# Patient Record
Sex: Female | Born: 1950
Health system: Southern US, Community
[De-identification: ages and names within clinical notes are randomized; demographics above are authoritative.]

## PROBLEM LIST (undated history)

## (undated) DIAGNOSIS — F32A Depression, unspecified: Secondary | ICD-10-CM

## (undated) DIAGNOSIS — F1020 Alcohol dependence, uncomplicated: Secondary | ICD-10-CM

## (undated) DIAGNOSIS — E785 Hyperlipidemia, unspecified: Secondary | ICD-10-CM

## (undated) DIAGNOSIS — F329 Major depressive disorder, single episode, unspecified: Secondary | ICD-10-CM

## (undated) HISTORY — PX: NOSE SURGERY: SHX723

---

## 2001-09-20 ENCOUNTER — Emergency Department (HOSPITAL_COMMUNITY): Admission: EM | Admit: 2001-09-20 | Discharge: 2001-09-20 | Payer: Self-pay | Admitting: Emergency Medicine

## 2001-09-23 ENCOUNTER — Other Ambulatory Visit (HOSPITAL_COMMUNITY): Admission: RE | Admit: 2001-09-23 | Discharge: 2001-09-27 | Payer: Self-pay | Admitting: Psychiatry

## 2009-07-14 ENCOUNTER — Emergency Department (HOSPITAL_COMMUNITY): Admission: EM | Admit: 2009-07-14 | Discharge: 2009-07-15 | Payer: Self-pay | Admitting: Emergency Medicine

## 2009-07-15 ENCOUNTER — Inpatient Hospital Stay (HOSPITAL_COMMUNITY): Admission: AD | Admit: 2009-07-15 | Discharge: 2009-07-18 | Payer: Self-pay | Admitting: Psychiatry

## 2009-07-15 ENCOUNTER — Ambulatory Visit: Payer: Self-pay | Admitting: Psychiatry

## 2009-09-06 ENCOUNTER — Emergency Department (HOSPITAL_COMMUNITY): Admission: EM | Admit: 2009-09-06 | Discharge: 2009-09-06 | Payer: Self-pay | Admitting: Emergency Medicine

## 2010-04-30 LAB — BASIC METABOLIC PANEL
Calcium: 8.8 mg/dL (ref 8.4–10.5)
Chloride: 106 mEq/L (ref 96–112)
Creatinine, Ser: 0.75 mg/dL (ref 0.4–1.2)
GFR calc Af Amer: >60 mL/min (ref >60)
GFR calc non Af Amer: >60 mL/min (ref >60)
Glucose, Bld: 95 mg/dL (ref 70–99)
Potassium: 4.1 mEq/L (ref 3.5–5.1)
Sodium: 140 mEq/L (ref 135–145)

## 2010-04-30 LAB — DIFFERENTIAL
Basophils Absolute: 0.1 10*3/uL (ref 0.0–0.1)
Eosinophils Absolute: 0 10*3/uL (ref 0.0–0.7)
Eosinophils Relative: 1 % (ref 0–5)
Lymphocytes Relative: 33 % (ref 12–46)
Lymphs Abs: 2.4 10*3/uL (ref 0.7–4.0)
Monocytes Relative: 5 % (ref 3–12)

## 2010-04-30 LAB — GLUCOSE, CAPILLARY: Glucose-Capillary: 91 mg/dL (ref 70–99)

## 2010-04-30 LAB — ETHANOL: Alcohol, Ethyl (B): 194 mg/dL — ABNORMAL HIGH (ref 0–10)

## 2010-04-30 LAB — CBC
HCT: 41.2 % (ref 36.0–46.0)
MCV: 94.3 fL (ref 78.0–100.0)
Platelets: 308 10*3/uL (ref 150–400)
RBC: 4.37 MIL/uL (ref 3.87–5.11)
RDW: 14.4 % (ref 11.5–15.5)
WBC: 7.3 10*3/uL (ref 4.0–10.5)

## 2010-04-30 LAB — URINALYSIS, ROUTINE W REFLEX MICROSCOPIC
Glucose, UA: NEGATIVE mg/dL
Hgb urine dipstick: NEGATIVE
Ketones, ur: NEGATIVE mg/dL
pH: 5 (ref 5.0–8.0)

## 2010-04-30 LAB — POCT CARDIAC MARKERS
CKMB, poc: <1 ng/mL — ABNORMAL LOW (ref 1.0–8.0)
Troponin i, poc: <0.05 ng/mL (ref 0.00–0.09)

## 2010-04-30 LAB — CK TOTAL AND CKMB (NOT AT ARMC): CK, MB: 0.9 ng/mL (ref 0.3–4.0)

## 2010-04-30 LAB — MAGNESIUM: Magnesium: 2.1 mg/dL (ref 1.5–2.5)

## 2010-05-02 LAB — DIFFERENTIAL
Basophils Absolute: 0 10*3/uL (ref 0.0–0.1)
Basophils Relative: 1 % (ref 0–1)
Eosinophils Absolute: 0 10*3/uL (ref 0.0–0.7)
Eosinophils Relative: 1 % (ref 0–5)
Lymphocytes Relative: 21 % (ref 12–46)
Lymphs Abs: 1.1 10*3/uL (ref 0.7–4.0)
Neutro Abs: 3.8 10*3/uL (ref 1.7–7.7)

## 2010-05-02 LAB — HEMOGLOBIN A1C
Hgb A1c MFr Bld: 5.5 % (ref <5.7)
Mean Plasma Glucose: 111 mg/dL (ref <117)

## 2010-05-02 LAB — COMPREHENSIVE METABOLIC PANEL
BUN: 6 mg/dL (ref 6–23)
GFR calc non Af Amer: >60 mL/min (ref >60)
Glucose, Bld: 171 mg/dL — ABNORMAL HIGH (ref 70–99)
Potassium: 3.4 mEq/L — ABNORMAL LOW (ref 3.5–5.1)
Sodium: 138 mEq/L (ref 135–145)

## 2010-05-02 LAB — CBC
HCT: 41.6 % (ref 36.0–46.0)
Hemoglobin: 14.2 g/dL (ref 12.0–15.0)
Platelets: 199 10*3/uL (ref 150–400)
RDW: 16.1 % — ABNORMAL HIGH (ref 11.5–15.5)
WBC: 5.3 10*3/uL (ref 4.0–10.5)

## 2010-05-02 LAB — GLUCOSE, RANDOM: Glucose, Bld: 93 mg/dL (ref 70–99)

## 2010-05-02 LAB — RAPID URINE DRUG SCREEN, HOSP PERFORMED
Amphetamines: NOT DETECTED
Benzodiazepines: NOT DETECTED
Opiates: NOT DETECTED
Tetrahydrocannabinol: NOT DETECTED

## 2010-05-02 LAB — ETHANOL: Alcohol, Ethyl (B): 46 mg/dL — ABNORMAL HIGH (ref 0–10)

## 2010-12-23 IMAGING — CT CT HEAD W/O CM
1 series · 16 of 30 positions shown, 20 images · non-contrast
Comparison: None.

CLINICAL DATA: Altered mental status

CT HEAD WITHOUT CONTRAST
TECHNIQUE: Contiguous axial images were obtained from the base of
the skull through the vertex without contrast.

[Series 2: head_seq 4.5 h37s st · axial · 0.43mm/px · z∈[-179,-53]mm · 16 of 32 slices shown, 20 images]
[im 2/32  brain]
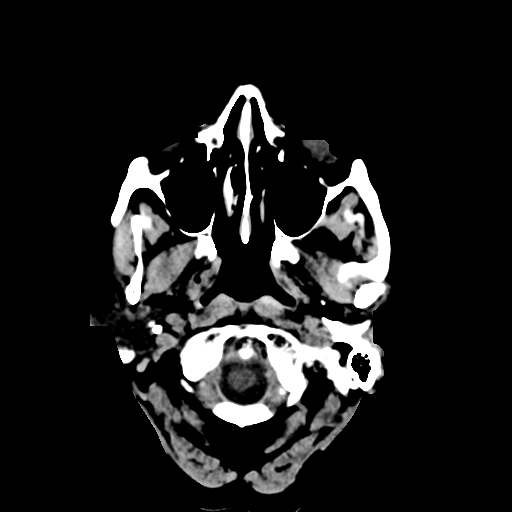
[im 2/32  bone]
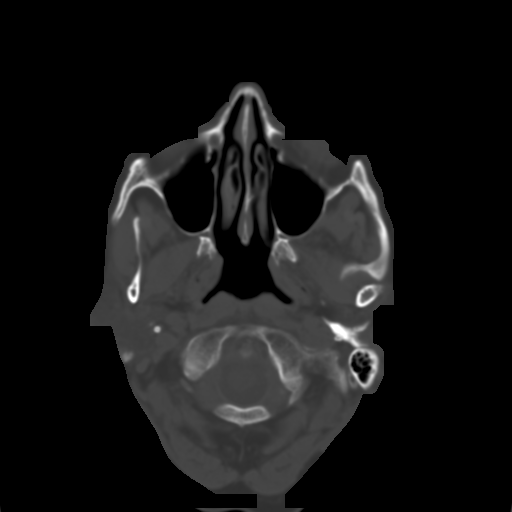
[im 4/32  brain]
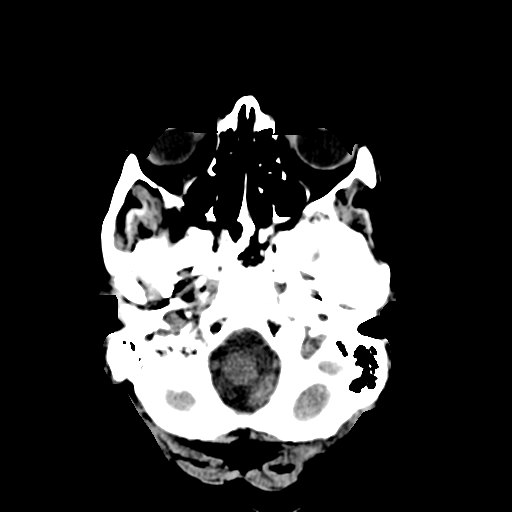
[im 6/32  brain]
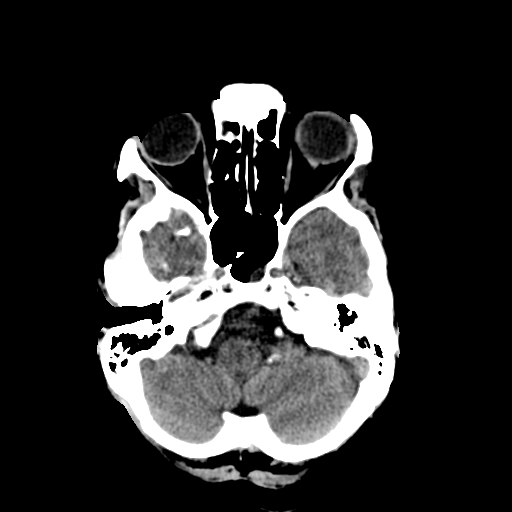
[im 8/32  brain]
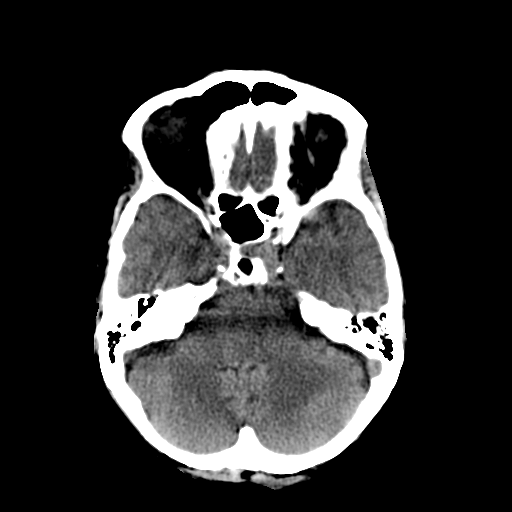
[im 9/32  brain]
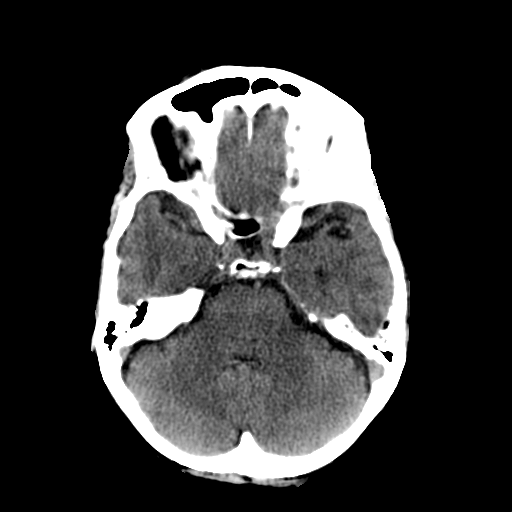
[im 9/32  bone]
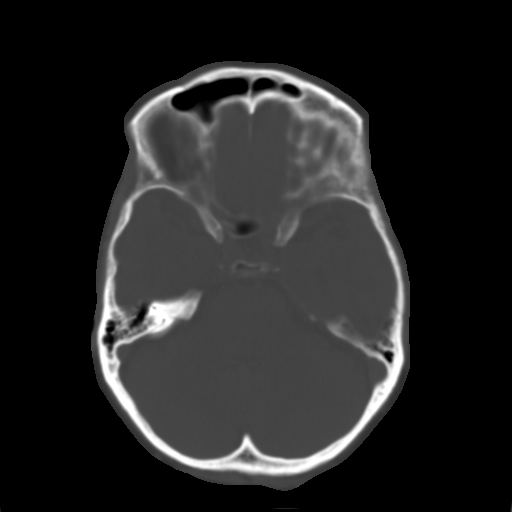
[im 11/32  brain]
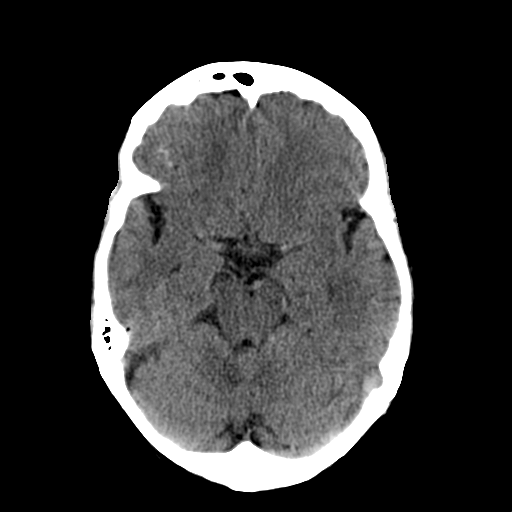
[im 13/32  brain]
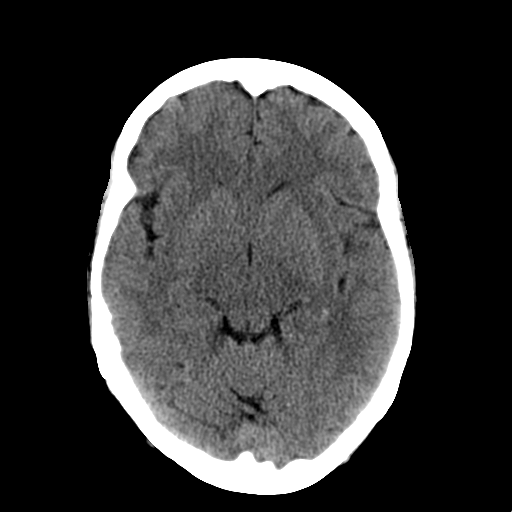
[im 15/32  brain]
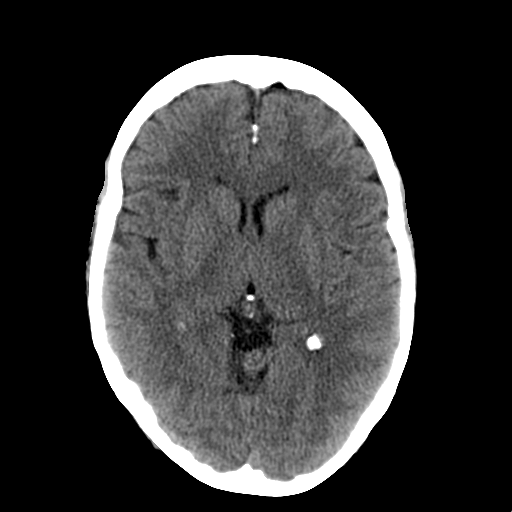
[im 17/32  brain]
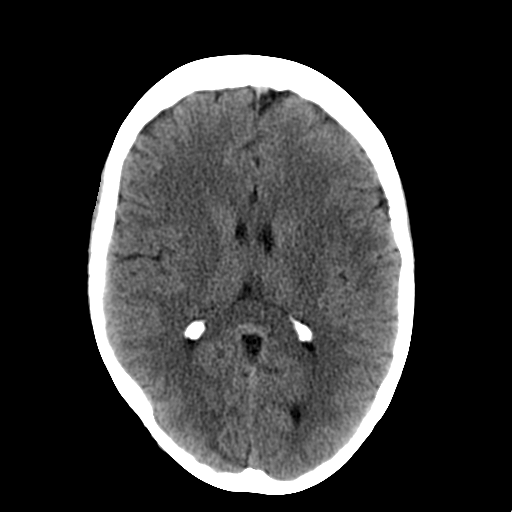
[im 17/32  bone]
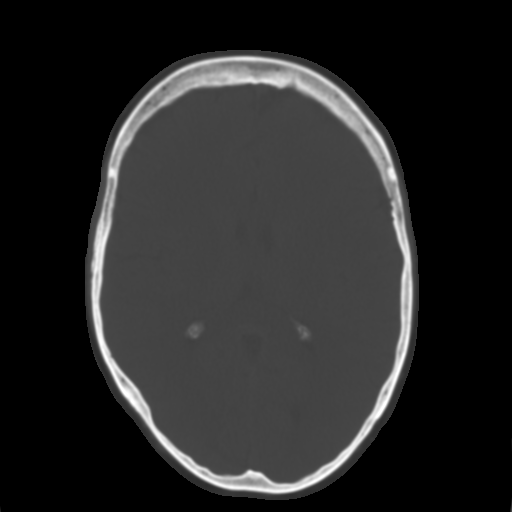
[im 19/32  brain]
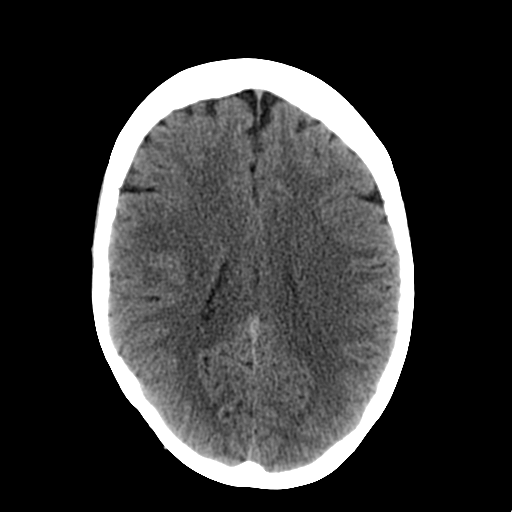
[im 21/32  brain]
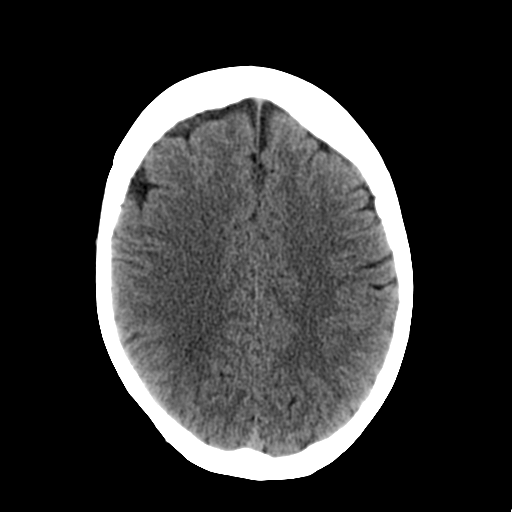
[im 23/32  brain]
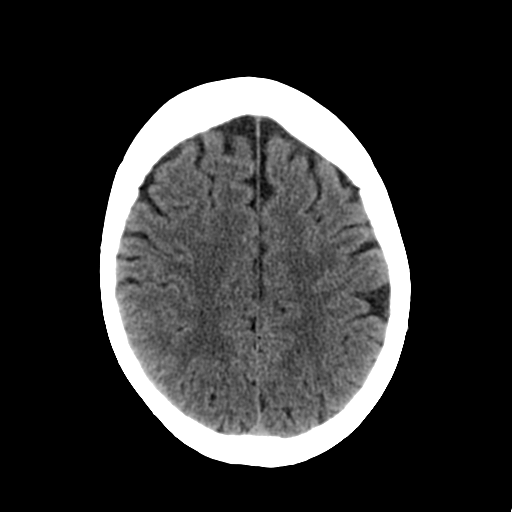
[im 24/32  brain]
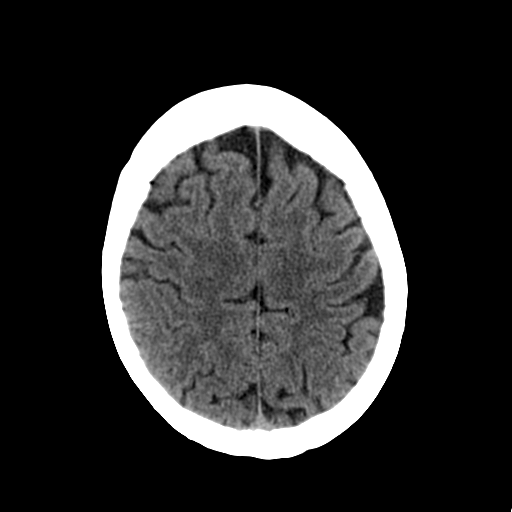
[im 24/32  bone]
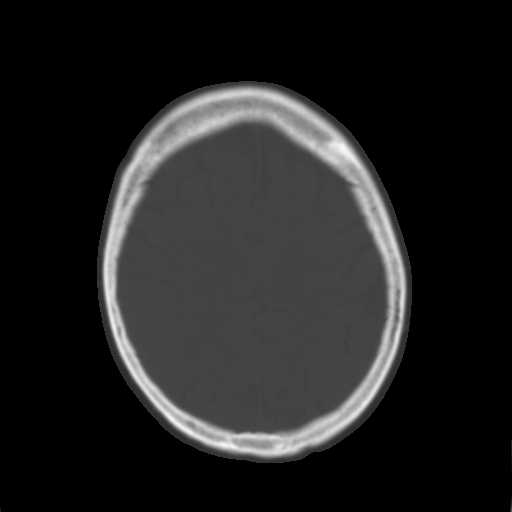
[im 26/32  brain]
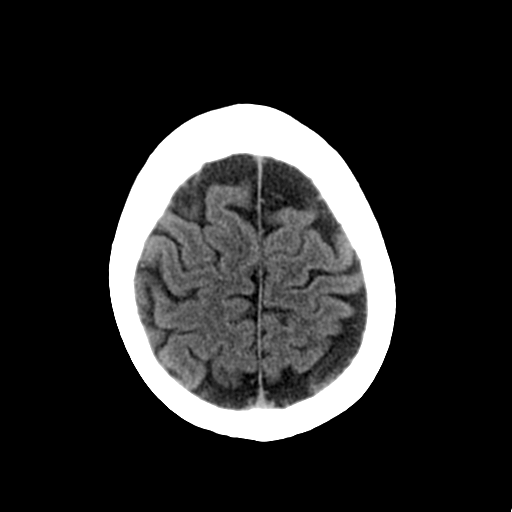
[im 28/32  brain]
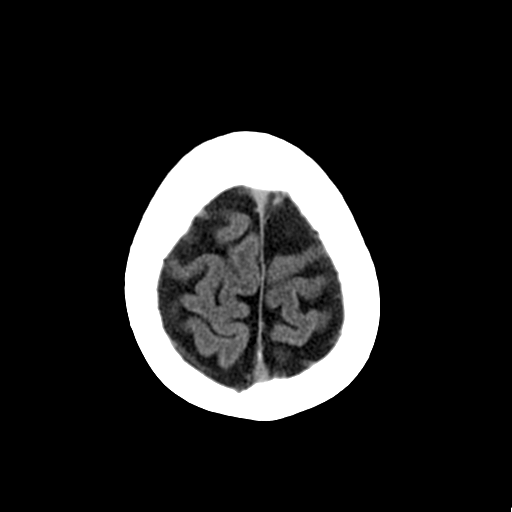
[im 30/32  brain]
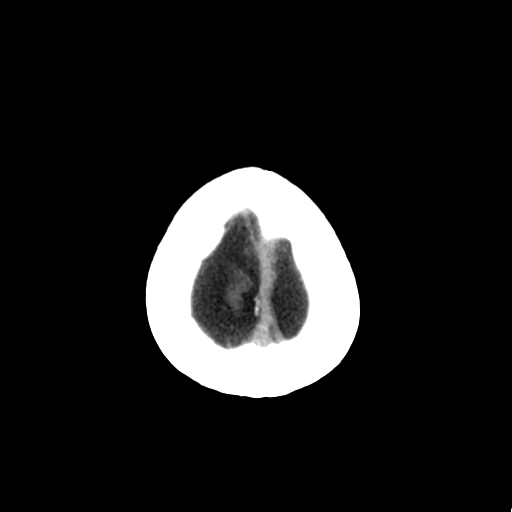

[16 of 30 positions shown; findings below may reference images not displayed]

FINDINGS: Ventricular size and CSF spaces normal.

 No evidence for acute infarct, hemorrhage, or mass lesion. No
extra-axial fluid collections or midline shift.  Calvarium intact.
No fluid in the sinuses visualized.
IMPRESSION: No acute or significant findings.

## 2012-03-31 ENCOUNTER — Emergency Department (HOSPITAL_COMMUNITY)
Admission: EM | Admit: 2012-03-31 | Discharge: 2012-04-01 | Disposition: A | Discharge status: Home / Self Care | Payer: 59 | Attending: Emergency Medicine | Admitting: Emergency Medicine

## 2012-03-31 ENCOUNTER — Encounter (HOSPITAL_COMMUNITY): Payer: Self-pay | Admitting: Emergency Medicine

## 2012-03-31 DIAGNOSIS — W268XXA Contact with other sharp object(s), not elsewhere classified, initial encounter: Secondary | ICD-10-CM | POA: Insufficient documentation

## 2012-03-31 DIAGNOSIS — S61215A Laceration without foreign body of left ring finger without damage to nail, initial encounter: Secondary | ICD-10-CM

## 2012-03-31 DIAGNOSIS — Z862 Personal history of diseases of the blood and blood-forming organs and certain disorders involving the immune mechanism: Secondary | ICD-10-CM | POA: Insufficient documentation

## 2012-03-31 DIAGNOSIS — Z8659 Personal history of other mental and behavioral disorders: Secondary | ICD-10-CM | POA: Insufficient documentation

## 2012-03-31 DIAGNOSIS — F1021 Alcohol dependence, in remission: Secondary | ICD-10-CM | POA: Insufficient documentation

## 2012-03-31 DIAGNOSIS — F17200 Nicotine dependence, unspecified, uncomplicated: Secondary | ICD-10-CM | POA: Insufficient documentation

## 2012-03-31 DIAGNOSIS — Y929 Unspecified place or not applicable: Secondary | ICD-10-CM | POA: Insufficient documentation

## 2012-03-31 DIAGNOSIS — Z8639 Personal history of other endocrine, nutritional and metabolic disease: Secondary | ICD-10-CM | POA: Insufficient documentation

## 2012-03-31 DIAGNOSIS — S61209A Unspecified open wound of unspecified finger without damage to nail, initial encounter: Secondary | ICD-10-CM | POA: Insufficient documentation

## 2012-03-31 DIAGNOSIS — Y9389 Activity, other specified: Secondary | ICD-10-CM | POA: Insufficient documentation

## 2012-03-31 DIAGNOSIS — Z23 Encounter for immunization: Secondary | ICD-10-CM | POA: Insufficient documentation

## 2012-03-31 HISTORY — DX: Hyperlipidemia, unspecified: E78.5

## 2012-03-31 HISTORY — DX: Major depressive disorder, single episode, unspecified: F32.9

## 2012-03-31 HISTORY — DX: Depression, unspecified: F32.A

## 2012-03-31 HISTORY — DX: Alcohol dependence, uncomplicated: F10.20

## 2012-03-31 MED ORDER — LIDOCAINE HCL 2 % IJ SOLN
5.0000 mL | Freq: Once | INTRAMUSCULAR | Status: DC
  Filled 2012-03-31: qty 20

## 2012-03-31 NOTE — ED Provider Notes (Signed)
History    This chart was scribed for non-physician practitioner working with Erin Most B. Bernette Mayers, MD by Erin [name], ED Scribe. This patient was seen in room WTR9/WTR9 and the patient's care was started at 2316.   CSN: [id number]  Arrival date & time 03/31/12  2100   First MD Initiated Contact with Patient 03/31/12 2316      No chief complaint on file.    The history is provided by the patient, the spouse and medical records. No language interpreter was used.   Erin [name] is a 62 y.o. female who presents to the Emergency Department complaining of sudden onset, non changing, moderate left index laceration that occurred this evening while she was cutting an apple . She reports that it bled significantly when she cut it and she has not since cleaned it. She states she has had to have stitches previously. She denies any other pain. She denies any other medical issues.  She has not take any medication for pain.    Past Medical History  Diagnosis Date  . Hyperlipemia   . Depression   . Alcoholism     Past Surgical History  Procedure Laterality Date  . Nose surgery      Family History  Problem Relation Age of Onset  . Heart failure Father     History  Substance Use Topics  . Smoking status: Current Every Day Smoker -- 1.00 packs/day    Types: Cigarettes  . Smokeless tobacco: Never Used  . Alcohol Use: No    OB History   Grav Para Term Preterm Abortions TAB SAB Ect Mult Living                  Review of Systems  Constitutional: Negative for fever, diaphoresis, appetite change, fatigue and unexpected weight change.  HENT: Negative for mouth sores and neck stiffness.   Eyes: Negative for visual disturbance.  Respiratory: Negative for cough, chest tightness, shortness of breath and wheezing.   Cardiovascular: Negative for chest pain.  Gastrointestinal: Negative for nausea, vomiting, abdominal pain, diarrhea and constipation.  Endocrine: Negative for  polydipsia, polyphagia and polyuria.  Genitourinary: Negative for dysuria, urgency, frequency and hematuria.  Musculoskeletal: Negative for back pain.  Skin: Positive for wound. Negative for rash.  Allergic/Immunologic: Negative for immunocompromised state.  Neurological: Negative for syncope, light-headedness and headaches.  Hematological: Does not bruise/bleed easily.  Psychiatric/Behavioral: Negative for sleep disturbance. The patient is not nervous/anxious.   All other systems reviewed and are negative.    Allergies  Review of patient's allergies indicates no known allergies.  Home Medications   Current Outpatient Rx  Name  Route  Sig  Dispense  Refill  . HYDROcodone-acetaminophen (NORCO/VICODIN) 5-325 MG per tablet   Oral   Take 2 tablets by mouth every 4 (four) hours as needed for pain.   10 tablet   0     BP 142/80  Pulse 83  Temp(Src) 98.2 F (36.8 C)  Resp 16  Ht 5\' 9"  (1.753 m)  Wt 200 lb (90.719 kg)  BMI 29.52 kg/m2  SpO2 98%  Physical Exam  Nursing note and vitals reviewed. Constitutional: She is oriented to person, place, and time. She appears well-developed and well-nourished. No distress.  HENT:  Head: Normocephalic and atraumatic.  Eyes: Conjunctivae are normal. No scleral icterus.  Neck: Normal range of motion.  Cardiovascular: Normal rate, regular rhythm, normal heart sounds and intact distal pulses.  Exam reveals no gallop and no friction rub.  No murmur heard. Pulmonary/Chest: Effort normal and breath sounds normal. No respiratory distress. She has no wheezes. She has no rales. She exhibits no tenderness.  Musculoskeletal: Normal range of motion. She exhibits no edema.  Lymphadenopathy:    She has no cervical adenopathy.  Neurological: She is alert and oriented to person, place, and time. She exhibits normal muscle tone. Coordination normal.  Skin: Skin is warm and dry. She is not diaphoretic.  Laceration to lateral side distal portion of left  index finger and does involve nail bed.    ED Course  LACERATION REPAIR Date/Time: 04/01/2012 12:21 AM Performed by: Erin [name] Authorized by: Erin [name] Consent: Verbal consent obtained. Risks and benefits: risks, benefits and alternatives were discussed Consent given by: patient Patient understanding: patient states understanding of the procedure being performed Patient consent: the patient's understanding of the procedure matches consent given Procedure consent: procedure consent matches procedure scheduled Relevant documents: relevant documents present and verified Site marked: the operative site was marked Required items: required blood products, implants, devices, and special equipment available Patient identity confirmed: verbally with patient Time out: Immediately prior to procedure a "time out" was called to verify the correct patient, procedure, equipment, support staff and site/side marked as required. Body area: upper extremity Location details: left ring finger Laceration length: 2 cm Contamination: The wound is contaminated. Foreign bodies: no foreign bodies Tendon involvement: none Nerve involvement: none Vascular damage: no Anesthesia: digital block Local anesthetic: lidocaine 2% without epinephrine Anesthetic total: 5 ml Patient sedated: no Preparation: Patient was prepped and draped in the usual sterile fashion. Irrigation solution: saline and tap water Irrigation method: syringe and tap Amount of cleaning: standard Debridement: none Degree of undermining: none Skin closure: 5-0 nylon Number of sutures: 4 Technique: simple Approximation: close Approximation difficulty: simple Dressing: 4x4 sterile gauze Patient tolerance: Patient tolerated the procedure well with no immediate complications.   (including critical care time) DIAGNOSTIC STUDIES: Oxygen Saturation is 98% on room air, normal by my interpretation.    COORDINATION OF  CARE: 11:21 PM Discussed treatment plan which includes stitches with pt at bedside and pt agreed to plan.     Labs Reviewed - No data to display No results found.   1. Laceration of left ring finger w/o foreign body w/o damage to nail       MDM  Erin [name] presents for laceration.  Tdap booster given.Pressure irrigation performed. Laceration occurred < 8 hours prior to repair which was well tolerated. Pt has no co morbidities to effect normal wound healing. Discussed suture home care w pt and answered questions. Pt to f-u for wound check and suture removal in 7 days. Pt is hemodynamically stable w no complaints prior to dc.    1. Medications: vicodin, usual home medications 2. Treatment: rest, drink plenty of fluids, keep wound clean and dry 3. Follow Up: Please followup with your primary doctor for discussion of your diagnoses and further evaluation after today's visit; if you do not have a primary care doctor use the resource guide provided to find one;   I personally performed the services described in this documentation, which was scribed in my presence. The recorded information has been reviewed and is accurate.         Dahlia Client Meryl Hubers, PA-C 04/01/12 [phone]

## 2012-04-01 MED ORDER — HYDROCODONE-ACETAMINOPHEN 5-325 MG PO TABS: 2.0000 | ORAL_TABLET | ORAL | Status: DC | PRN

## 2012-04-01 MED ORDER — TETANUS-DIPHTH-ACELL PERTUSSIS 5-2.5-18.5 LF-MCG/0.5 IM SUSP
0.5000 mL | Freq: Once | INTRAMUSCULAR | Status: AC
  Administered 2012-04-01: 0.5 mL via INTRAMUSCULAR
  Filled 2012-04-01: qty 0.5

## 2012-04-01 NOTE — ED Provider Notes (Signed)
Medical screening examination/treatment/procedure(s) were performed by non-physician practitioner and as supervising physician I was immediately available for consultation/collaboration.   Charles B. Sheldon, MD 04/01/12 1416 

## 2016-08-17 ENCOUNTER — Emergency Department (HOSPITAL_COMMUNITY)
Admission: EM | Admit: 2016-08-17 | Discharge: 2016-08-18 | Disposition: A | Discharge status: Home / Self Care | Payer: BLUE CROSS/BLUE SHIELD | Attending: Emergency Medicine | Admitting: Emergency Medicine

## 2016-08-17 ENCOUNTER — Encounter (HOSPITAL_COMMUNITY): Payer: Self-pay | Admitting: Emergency Medicine

## 2016-08-17 DIAGNOSIS — F10230 Alcohol dependence with withdrawal, uncomplicated: Secondary | ICD-10-CM

## 2016-08-17 DIAGNOSIS — F10930 Alcohol use, unspecified with withdrawal, uncomplicated: Secondary | ICD-10-CM

## 2016-08-17 DIAGNOSIS — E860 Dehydration: Secondary | ICD-10-CM | POA: Diagnosis not present

## 2016-08-17 DIAGNOSIS — F17210 Nicotine dependence, cigarettes, uncomplicated: Secondary | ICD-10-CM | POA: Insufficient documentation

## 2016-08-17 DIAGNOSIS — F1010 Alcohol abuse, uncomplicated: Secondary | ICD-10-CM

## 2016-08-17 DIAGNOSIS — Z79899 Other long term (current) drug therapy: Secondary | ICD-10-CM | POA: Insufficient documentation

## 2016-08-17 LAB — CBC
HCT: 40.2 % (ref 36.0–46.0)
Hemoglobin: 14.6 g/dL (ref 12.0–15.0)
MCH: 31.1 pg (ref 26.0–34.0)
MCHC: 36.3 g/dL — ABNORMAL HIGH (ref 30.0–36.0)
MCV: 85.7 fL (ref 78.0–100.0)
PLATELETS: 244 10*3/uL (ref 150–400)
RBC: 4.69 MIL/uL (ref 3.87–5.11)
RDW: 13.7 % (ref 11.5–15.5)
WBC: 11.4 10*3/uL — AB (ref 4.0–10.5)

## 2016-08-17 LAB — COMPREHENSIVE METABOLIC PANEL
ALT: 45 U/L (ref 14–54)
AST: 39 U/L (ref 15–41)
Albumin: 3.4 g/dL — ABNORMAL LOW (ref 3.5–5.0)
Alkaline Phosphatase: 124 U/L (ref 38–126)
Anion gap: 15 (ref 5–15)
BILIRUBIN TOTAL: 0.5 mg/dL (ref 0.3–1.2)
BUN: 6 mg/dL (ref 6–20)
CO2: 21 mmol/L — ABNORMAL LOW (ref 22–32)
CREATININE: 0.55 mg/dL (ref 0.44–1.00)
Calcium: 8.6 mg/dL — ABNORMAL LOW (ref 8.9–10.3)
Chloride: 94 mmol/L — ABNORMAL LOW (ref 101–111)
Glucose, Bld: 150 mg/dL — ABNORMAL HIGH (ref 65–99)
POTASSIUM: 3.4 mmol/L — AB (ref 3.5–5.1)
Sodium: 130 mmol/L — ABNORMAL LOW (ref 135–145)
TOTAL PROTEIN: 6.5 g/dL (ref 6.5–8.1)

## 2016-08-17 LAB — I-STAT CG4 LACTIC ACID, ED: LACTIC ACID, VENOUS: 3.06 mmol/L — AB (ref 0.5–1.9)

## 2016-08-17 LAB — ETHANOL: ALCOHOL ETHYL (B): 176 mg/dL — AB (ref <5)

## 2016-08-17 LAB — LIPASE, BLOOD: Lipase: 14 U/L (ref 11–51)

## 2016-08-17 MED ORDER — SODIUM CHLORIDE 0.9 % IV BOLUS (SEPSIS)
1000.0000 mL | Freq: Once | INTRAVENOUS | Status: AC
  Administered 2016-08-17: 1000 mL via INTRAVENOUS

## 2016-08-17 MED ORDER — LORAZEPAM 1 MG PO TABS
1.0000 mg | ORAL_TABLET | Freq: Once | ORAL | Status: AC
  Administered 2016-08-18: 1 mg via ORAL
  Filled 2016-08-17: qty 1

## 2016-08-17 MED ORDER — VITAMIN B-1 100 MG PO TABS
100.0000 mg | ORAL_TABLET | Freq: Once | ORAL | Status: AC
  Administered 2016-08-17: 100 mg via ORAL
  Filled 2016-08-17: qty 1

## 2016-08-17 MED ORDER — SODIUM CHLORIDE 0.9 % IV BOLUS (SEPSIS)
1000.0000 mL | Freq: Once | INTRAVENOUS | Status: AC
  Administered 2016-08-18: 1000 mL via INTRAVENOUS

## 2016-08-17 MED ORDER — ONDANSETRON HCL 4 MG/2ML IJ SOLN
4.0000 mg | Freq: Once | INTRAMUSCULAR | Status: AC
  Administered 2016-08-17: 4 mg via INTRAVENOUS
  Filled 2016-08-17: qty 2

## 2016-08-17 MED ORDER — LORAZEPAM 2 MG/ML IJ SOLN
1.0000 mg | Freq: Once | INTRAMUSCULAR | Status: AC
  Administered 2016-08-17: 1 mg via INTRAVENOUS
  Filled 2016-08-17: qty 1

## 2016-08-17 NOTE — ED Triage Notes (Signed)
Per EMS, pt seeking alcohol detox. Pt started binge drinking on Tuesday, hasn't been eating. Had been sober for several years.

## 2016-08-17 NOTE — ED Notes (Signed)
Writer notifed EDP of abnormal I stat lactic result. 

## 2016-08-18 MED ORDER — CHLORDIAZEPOXIDE HCL 25 MG PO CAPS: ORAL_CAPSULE | ORAL | 0 refills | Status: DC

## 2016-08-18 NOTE — ED Provider Notes (Signed)
[facility]-EMERGENCY DEPT Provider Note   CSN: [id number] Arrival date & time: 08/17/16  1816     History   Chief Complaint Chief Complaint  Patient presents with  . Alcohol Detox    HPI Erin [name] is a 66 y.o. female.  66 year old female with past medical history including alcoholism, anxiety/depression, hyperlipidemia who presents with alcohol abuse. The patient has been clean from alcohol for a long time but recently has had increased stress at work and with family illnesses. She started drinking again approximately one week ago and has been binge drinking for the past 4 days. Daughter noticed that she stopped taking her meds 2 days ago but then restarted them today. She was taken off of Lamictal one month ago due to concern for allergic reaction, and daughter notes that her anxiety has been worse since then. Patient has had shakiness and nausea, no chest pain or breathing problems. No other substance use. She denies any suicidal ideation, homicidal ideation, or hallucinations.   The history is provided by the patient and a relative.    Past Medical History:  Diagnosis Date  . Alcoholism (HCC)   . Depression   . Hyperlipemia     There are no active problems to display for this patient.   Past Surgical History:  Procedure Laterality Date  . NOSE SURGERY      OB History    No data available       Home Medications    Prior to Admission medications   Medication Sig Start Date End Date Taking? Authorizing Provider  atorvastatin (LIPITOR) 10 MG tablet Take 10 mg by mouth daily.   Yes [provider]  FLUoxetine (PROZAC) 40 MG capsule Take 40 mg by mouth daily.   Yes [provider]  lurasidone (LATUDA) 40 MG TABS tablet Take 20 mg by mouth daily with breakfast.   Yes [provider]  HYDROcodone-acetaminophen (NORCO/VICODIN) 5-325 MG per tablet Take 2 tablets by mouth every 4 (four) hours as needed for pain. Patient not taking:  Reported on 08/17/2016 04/01/12   Muthersbaugh, Dahlia Client, PA-C    Family History Family History  Problem Relation Age of Onset  . Heart failure Father     Social History Social History  Substance Use Topics  . Smoking status: Current Every Day Smoker    Packs/day: 1.00    Types: Cigarettes  . Smokeless tobacco: Never Used  . Alcohol use No     Allergies   Lamictal [lamotrigine] and Vistaril [hydroxyzine hcl]   Review of Systems Review of Systems   Physical Exam Updated Vital Signs BP 133/71 (BP Location: Right Arm)   Pulse 93   Temp 98.4 F (36.9 C) (Oral)   Resp 17   SpO2 94%   Physical Exam  Constitutional: She is oriented to person, place, and time. She appears well-developed and well-nourished.  anxious  HENT:  Head: Normocephalic and atraumatic.  Moist mucous membranes  Eyes: Conjunctivae are normal. Pupils are equal, round, and reactive to light.  Neck: Neck supple.  Cardiovascular: Regular rhythm and normal heart sounds.  Tachycardia present.   No murmur heard. Pulmonary/Chest: Effort normal and breath sounds normal.  Abdominal: Soft. Bowel sounds are normal. She exhibits no distension. There is no tenderness.  Musculoskeletal: She exhibits no edema.  Neurological: She is alert and oriented to person, place, and time.  Fluent speech  Skin: Skin is warm and dry.  Psychiatric:  Depressed mood, anxious  Nursing note and vitals reviewed.  ED Treatments / Results  Labs (all labs ordered are listed, but only abnormal results are displayed) Labs Reviewed  COMPREHENSIVE METABOLIC PANEL - Abnormal; Notable for the following:       Result Value   Sodium 130 (*)    Potassium 3.4 (*)    Chloride 94 (*)    CO2 21 (*)    Glucose, Bld 150 (*)    Calcium 8.6 (*)    Albumin 3.4 (*)    All other components within normal limits  ETHANOL - Abnormal; Notable for the following:    Alcohol, Ethyl (B) 176 (*)    All other components within normal limits  CBC -  Abnormal; Notable for the following:    WBC 11.4 (*)    MCHC 36.3 (*)    All other components within normal limits  I-STAT CG4 LACTIC ACID, ED - Abnormal; Notable for the following:    Lactic Acid, Venous 3.06 (*)    All other components within normal limits  LIPASE, BLOOD    EKG  EKG Interpretation None       Radiology No results found.  Procedures Procedures (including critical care time)  Medications Ordered in ED Medications  LORazepam (ATIVAN) tablet 1 mg (not administered)  sodium chloride 0.9 % bolus 1,000 mL (0 mLs Intravenous Stopped 08/18/16 0002)  ondansetron (ZOFRAN) injection 4 mg (4 mg Intravenous Given 08/17/16 2253)  LORazepam (ATIVAN) injection 1 mg (1 mg Intravenous Given 08/17/16 2253)  thiamine (VITAMIN B-1) tablet 100 mg (100 mg Oral Given 08/17/16 2255)  sodium chloride 0.9 % bolus 1,000 mL (1,000 mLs Intravenous New Bag/Given 08/18/16 0015)     Initial Impression / Assessment and Plan / ED Course  I have reviewed the triage vital signs and the nursing notes.  Pertinent labs that were available during my care of the patient were reviewed by me and considered in my medical decision making (see chart for details).     PT w/ distant h/o alcoholism p/w 1 week alcohol use. Daughter concerned because she has been binge drinking for several days. They are requesting detox. Pt was tachycardic and mildly hypertensive on exam, but no Tremulousness and mentating appropriately. She denied any SI/HI. Her labs show dehydration with lactate of 3 but normal creatinine, sodium 1:30, potassium 3.4, chloride 94, glucose 150, normal LFTs. After 1 dose of Ativan, the patient has been resting comfortably and is not demonstrating any ongoing severe alcohol withdrawal symptoms. Given the shorter duration of her alcohol use, as well as her mild symptoms, I feel she is safe for outpatient management with Librium taper. Her daughter will help her contact PCP and psychiatrist in the morning  for close follow-up and I also provided with outpatient resources for detox. They feel comfortable with this plan and they have voiced understanding with return precautions. Patient will be discharged home after completion of IV fluids.  Final Clinical Impressions(s) / ED Diagnoses   Final diagnoses:  Alcohol abuse  Alcohol withdrawal syndrome without complication (HCC)  Dehydration    New Prescriptions New Prescriptions   No medications on file     Lila Lufkin, Ambrose Finlandachel Morgan, MD 08/18/16 [phone]

## 2016-08-18 NOTE — Discharge Instructions (Signed)
TAKE LIBRIUM TAPER TO CONTROL ALCOHOL WITHDRAWAL SYMPTOMS. CONTACT YOUR PSYCHIATRIST AND PRIMARY CARE PROVIDER IN THE MORNING FOR CLOSE FOLLOW UP. RETURN TO ER IF YOU DEVELOP CONFUSION, SEVERE VOMITING, OR ANY WORSENING SYMPTOMS.

## 2016-11-08 ENCOUNTER — Ambulatory Visit (HOSPITAL_COMMUNITY)
Admission: RE | Admit: 2016-11-08 | Discharge: 2016-11-08 | Disposition: A | Discharge status: Home / Self Care | Payer: BLUE CROSS/BLUE SHIELD | Attending: Psychiatry | Admitting: Psychiatry

## 2016-11-08 DIAGNOSIS — R002 Palpitations: Secondary | ICD-10-CM | POA: Diagnosis not present

## 2016-11-08 DIAGNOSIS — R079 Chest pain, unspecified: Secondary | ICD-10-CM | POA: Insufficient documentation

## 2016-11-08 DIAGNOSIS — R110 Nausea: Secondary | ICD-10-CM | POA: Insufficient documentation

## 2016-11-08 DIAGNOSIS — R251 Tremor, unspecified: Secondary | ICD-10-CM | POA: Insufficient documentation

## 2016-11-08 DIAGNOSIS — F1021 Alcohol dependence, in remission: Secondary | ICD-10-CM | POA: Diagnosis not present

## 2016-11-08 DIAGNOSIS — R450 Nervousness: Secondary | ICD-10-CM | POA: Diagnosis not present

## 2016-11-08 DIAGNOSIS — F1010 Alcohol abuse, uncomplicated: Secondary | ICD-10-CM

## 2016-11-08 HISTORY — DX: Alcohol abuse, uncomplicated: F10.10

## 2016-11-08 NOTE — H&P (Signed)
Behavioral Health Medical Screening Exam  Erin [name] is an 66 y.o. female.  Total Time spent with patient: 45 minutes  Psychiatric Specialty Exam: Physical Exam  Vitals reviewed. Constitutional: She is oriented to person, place, and time.  Neck: Normal range of motion.  Cardiovascular: Normal rate and regular rhythm.   Respiratory: Effort normal.  Musculoskeletal: Normal range of motion.  Neurological: She is alert and oriented to person, place, and time.  Psychiatric: Her speech is normal and behavior is normal. Her mood appears anxious. Thought content is not paranoid and not delusional. Cognition and memory are normal. She expresses impulsivity. She expresses no homicidal and no suicidal ideation.    Review of Systems  HENT: Negative.   Respiratory: Negative.   Cardiovascular: Positive for chest pain and palpitations.  Gastrointestinal: Positive for nausea.  Genitourinary: Negative.   Musculoskeletal: Negative.   Skin: Negative for itching and rash.       Diaphoresis    Neurological: Positive for tremors. Seizures: Denies.  Psychiatric/Behavioral: Negative for depression, hallucinations and suicidal ideas. Substance abuse: Alcohol. The patient is nervous/anxious.   All other systems reviewed and are negative.   Blood pressure 134/70, pulse 90, temperature 99.5 F (37.5 C), temperature source Oral, resp. rate (!) 24, SpO2 100 %.There is no height or weight on file to calculate BMI.  General Appearance: Casual  Eye Contact:  Minimal  Speech:  Clear and Coherent and Normal Rate  Volume:  Decreased  Mood:  Anxious  Affect:  Congruent  Thought Process:  Goal Directed  Orientation:  Full (Time, Place, and Person)  Thought Content:  Denies hallucinations and delusions.  Daughter states some parania that family is trying to set her up  Suicidal Thoughts:  No  Homicidal Thoughts:  No  Memory:  Immediate;   Good Recent;   Good Remote;   Good  Judgement:  Fair   Insight:  Fair  Psychomotor Activity:  Tremor  Concentration: Concentration: Fair and Attention Span: Fair  Recall:  Good  Fund of Knowledge:Fair  Language: Good  Akathisia:  No  Handed:  Right  AIMS (if indicated):     Assets:  Communication Skills Desire for Improvement Housing Social Support Transportation  Sleep:       Musculoskeletal: Strength & Muscle Tone: within normal limits Gait & Station: normal Patient leans: N/A  Assessment:  Erin [name], 66 y.o., female present as walk in at [facility].  Patient seen by this provider and consulted with Dr. Lucianne Muss.  During evaluation Erin [name] reports  She has a history of alcoholism; sober for 10 years until fell off wagon July 2018 and several times since then.  Patient has been treated for withdrawal by her outpatient provider Dr Bufford Buttner with Clonidine, Xanax, and Vistaril.  Other medications are Latuda and Prozac.  Daughter at side and states that when medications wears off withdrawal symptoms worse and feels patient needs inpatient.  Patient denies suicidal/homicidal ideation, psychosis, and paranoia.  Denies history of seizures.   Patient showing withdrawal symptoms from alcohol, Benzodiazepines recommended to go to ED related to tachycardia and withdrawal symptoms.  Patient states that she does not want to go to the ED.  Resource information for substance abuse given.  Also TTS will give information for possible detox inpatient availability.    Blood pressure 134/70, pulse 90, temperature 99.5 F (37.5 C), temperature source Oral, resp. rate (!) 24, SpO2 100 %.  Recommendations:  Based on my evaluation the patient appears  to have an emergency medical condition for which I recommend the patient be transferred to the emergency department for further evaluation.  Patient refuses to got to ED Patient advised if unable to get into detox and withdrawal continue to worsen then should go to ED; understanding  voiced.    Erin Golla, NP 11/08/2016, 5:49 PM

## 2016-11-08 NOTE — BH Assessment (Signed)
Tele Assessment Note   Patient Name: Erin [name] MRN: [id number] Referring Physician: Bevelyn Buckles  Location of Patient: BH-ASSESSMENT SERVICE Location of Provider: Behavioral Health TTS Department  Erin [name] is an 66 y.o. female who came to [facility] seeking detox off alcohol. She was referred by her therapist at [facility] due to her withdrawal symptoms of nausea, tremors, diarrhea, anxiety, and depression. Pt was visibly uncomfortable during assessment. Pt was there with her daughter who mostly spoke for her because she was having trouble speaking due to her symptoms of withdrawal. Daughter states that she has a history of alcoholism but has been sober for 10+ years. She recently had a relapse in July of this year and has been drinking vodka since. Her last drink was on Saturday and her doctor prescribed her some medications to help her detox at home. She states that the medications help for a short amount of time then she feels bad again. Pt denies any suicidal ideations, Homicidal ideations or AVH. She does have a history of bipolar disorder but has been managing it well with latuda. She recently stopped taking her latuda before July of this year and believe this is what triggered the relapse. Pt denies any previous history of suicide attempts in past. Pt does not meet criteria for inpatient admission per Dr. Lucianne Muss. She does recommend that pt go to [facility] for medical clearance due to elevated heart rate. Pt refused to go to [facility] and does not meet criteria for IVC. Writer called [facility] to refer pt for detox services. Pt is accepted to their facility pending insurance verification. Writer facilitated short intake with [facility] center over the phone. Other resources for substance abuse treatment were given.   Diagnosis: Bipolar 1 Disorder current episode depressed, Alcohol use disorder severe   Past Medical History:  Past Medical History:   Diagnosis Date  . Alcoholism (HCC)   . Depression   . Hyperlipemia     Past Surgical History:  Procedure Laterality Date  . NOSE SURGERY      Family History:  Family History  Problem Relation Age of Onset  . Heart failure Father     Social History:  reports that she has been smoking Cigarettes.  She has been smoking about 1.00 pack per day. She has never used smokeless tobacco. She reports that she does not drink alcohol or use drugs.  Additional Social History:  Alcohol / Drug Use History of alcohol / drug use?: Yes Longest period of sobriety (when/how long): sober for 10 plus years then relapsed  Substance #1 Name of Substance 1: Alcohol  1 - Age of First Use: unknown 1 - Amount (size/oz): drinking unknown amount of vodka 1 - Frequency: daily  1 - Duration: since july  1 - Last Use / Amount: saturday 22nd   CIWA: CIWA-Ar BP: 134/70 Pulse Rate: 90 COWS:    PATIENT STRENGTHS: (choose at least two) Average or above average intelligence General fund of knowledge Motivation for treatment/growth  Allergies:  Allergies  Allergen Reactions  . Lamictal [Lamotrigine] Swelling    Caused swelling in the face  . Vistaril [Hydroxyzine Hcl] Swelling    Caused swelling in the face    Home Medications:  (Not in a hospital admission)  OB/GYN Status:  No LMP recorded. Patient is postmenopausal.  General Assessment Data Location of Assessment: [facility] Assessment Services TTS Assessment: In system Is this a Tele or Face-to-Face Assessment?: Face-to-Face Is this an Initial Assessment or  a Re-assessment for this encounter?: Initial Assessment Marital status: Single Is patient pregnant?: No Pregnancy Status: No Living Arrangements: Alone Can pt return to current living arrangement?: Yes Admission Status: Voluntary Is patient capable of signing voluntary admission?: Yes Referral Source: Self/Family/Friend Insurance type: BCBS     Crisis Care Plan Living Arrangements:  Alone Name of Psychiatrist: Bevelyn Buckles Name of Therapist: [facility]  Education Status Is patient currently in school?: No  Risk to self with the past 6 months Suicidal Ideation: No Has patient been a risk to self within the past 6 months prior to admission? : No Suicidal Intent: No Has patient had any suicidal intent within the past 6 months prior to admission? : No Is patient at risk for suicide?: No Suicidal Plan?: No Has patient had any suicidal plan within the past 6 months prior to admission? : No Access to Means: No What has been your use of drugs/alcohol within the last 12 months?: using alcohol daily since july 4th Previous Attempts/Gestures: No How many times?: 0 Other Self Harm Risks: none Triggers for Past Attempts: None known Intentional Self Injurious Behavior: None Family Suicide History: No Recent stressful life event(s): Other (Comment) (stopped taking bipolar meds and relapsed) Persecutory voices/beliefs?: No Depression: Yes Depression Symptoms: Despondent, Isolating, Feeling worthless/self pity Substance abuse history and/or treatment for substance abuse?: Yes Suicide prevention information given to non-admitted patients: Not applicable  Risk to Others within the past 6 months Homicidal Ideation: No Does patient have any lifetime risk of violence toward others beyond the six months prior to admission? : No Thoughts of Harm to Others: No Current Homicidal Intent: No Current Homicidal Plan: No Access to Homicidal Means: No Identified Victim: none History of harm to others?: No Assessment of Violence: None Noted Violent Behavior Description: no Does patient have access to weapons?: No Criminal Charges Pending?: No Does patient have a court date: No Is patient on probation?: No  Psychosis Hallucinations: None noted Delusions: None noted  Mental Status Report Appearance/Hygiene: Disheveled Eye Contact: Poor Motor Activity:  Tremors Speech: Slow Level of Consciousness: Irritable Mood: Irritable Affect:  (pt detoxing ) Anxiety Level: Panic Attacks Panic attack frequency: daily  Most recent panic attack: today  Thought Processes: Coherent Judgement: Impaired Orientation: Person, Place, Time, Situation Obsessive Compulsive Thoughts/Behaviors: None  Cognitive Functioning Concentration: Normal Memory: Recent Intact, Remote Intact IQ: Average Insight: Fair Impulse Control: Poor Appetite: Poor Weight Loss: 0 Weight Gain: 0 Sleep: Decreased Total Hours of Sleep: 3 Vegetative Symptoms: None  ADLScreening [facility] Assessment Services) Patient's cognitive ability adequate to safely complete daily activities?: Yes Patient able to express need for assistance with ADLs?: Yes Independently performs ADLs?: Yes (appropriate for developmental age)  Prior Inpatient Therapy Prior Inpatient Therapy: Yes Prior Therapy Dates: unknown [facility]Provider[facility]): [facility] Reason for Treatment: Alcoholism  Prior Outpatient Therapy Prior Outpatient Therapy: Yes Prior Therapy Dates: ongoing Prior Therapy Facilty/Provider(s): [facility] Reason for Treatment: bipolar disorder Does patient have an ACCT team?: No Does patient have Intensive In-House Services?  : No Does patient have [facility] services? : No Does patient have P4CC services?: No  ADL Screening (condition at time of admission) Patient's cognitive ability adequate to safely complete daily activities?: Yes Is the patient deaf or have difficulty hearing?: No Does the patient have difficulty seeing, even when wearing glasses/contacts?: No Does the patient have difficulty concentrating, remembering, or making decisions?: No Patient able to express need for assistance with ADLs?: Yes Does the patient have difficulty dressing or  bathing?: No Independently performs ADLs?: Yes (appropriate for developmental age) Does the patient have  difficulty walking or climbing stairs?: No Weakness of Legs: None Weakness of Arms/Hands: None  Home Assistive Devices/Equipment Home Assistive Devices/Equipment: None  Therapy Consults (therapy consults require a physician order) PT Evaluation Needed: No OT Evalulation Needed: No SLP Evaluation Needed: No Abuse/Neglect Assessment (Assessment to be complete while patient is alone) Physical Abuse: Denies Verbal Abuse: Denies Sexual Abuse: Denies Exploitation of patient/patient's resources: Denies Self-Neglect: Denies Values / Beliefs Cultural Requests During Hospitalization: None Spiritual Requests During Hospitalization: None Consults Spiritual Care Consult Needed: No Social Work Consult Needed: No Merchant navy officer (For Healthcare) Does Patient Have a Medical Advance Directive?: No Would patient like information on creating a medical advance directive?: No - Patient declined Nutrition Screen- [facility] Adult/WL/AP Patient's home diet: Regular Has the patient recently lost weight without trying?: No Has the patient been eating poorly because of a decreased appetite?: No Malnutrition Screening Tool Score: 0  Additional Information 1:1 In Past 12 Months?: No CIRT Risk: No Elopement Risk: No Does patient have medical clearance?: Yes     Disposition:  Disposition Initial Assessment Completed for this Encounter: Yes Disposition of Patient: Other dispositions Other disposition(s): Referred to outside facility  This service was provided via telemedicine using a 2-way, interactive audio and video technology.  Names of all persons participating in this telemedicine service and their role in this encounter. Name: [facility]  Role: Writer              Jarrett Ables [facility], [place]  11/08/2016 5:34 PM

## 2017-06-01 ENCOUNTER — Other Ambulatory Visit: Payer: Self-pay

## 2017-06-01 ENCOUNTER — Encounter: Payer: Self-pay | Admitting: Family Medicine

## 2017-06-01 ENCOUNTER — Ambulatory Visit (INDEPENDENT_AMBULATORY_CARE_PROVIDER_SITE_OTHER): Payer: BLUE CROSS/BLUE SHIELD | Admitting: Family Medicine

## 2017-06-01 VITALS — BP 120/82 | HR 83 | Temp 98.0°F | Resp 16 | Ht 67.32 in | Wt 180.2 lb

## 2017-06-01 DIAGNOSIS — H6592 Unspecified nonsuppurative otitis media, left ear: Secondary | ICD-10-CM

## 2017-06-01 DIAGNOSIS — J069 Acute upper respiratory infection, unspecified: Secondary | ICD-10-CM

## 2017-06-01 MED ORDER — AMOXICILLIN 875 MG PO TABS: 875.0000 mg | ORAL_TABLET | Freq: Two times a day (BID) | ORAL | 0 refills | Status: DC

## 2017-06-01 MED ORDER — IPRATROPIUM BROMIDE 0.03 % NA SOLN: 2.0000 | Freq: Two times a day (BID) | NASAL | 1 refills | Status: DC | PRN

## 2017-06-01 NOTE — Patient Instructions (Addendum)
Atrovent nasal spray twice per day as needed for congestion.  If ear pain is not improving in the next day or two, can start antibiotic.   Return to the clinic or go to the nearest emergency room if any of your symptoms worsen or new symptoms occur.     IF you received an x-ray today, you will receive an invoice from [facility]. Please contact [facility] at [phone] with questions or concerns regarding your invoice.   IF you received labwork today, you will receive an invoice from [facility]. Please contact [facility] at [phone] with questions or concerns regarding your invoice.   Our billing staff will not be able to assist you with questions regarding bills from these companies.  You will be contacted with the lab results as soon as they are available. The fastest way to get your results is to activate your My Chart account. Instructions are located on the last page of this paperwork. If you have not heard from us regarding the results in 2 weeks, please contact this office.

## 2017-06-01 NOTE — Progress Notes (Signed)
Subjective:  By signing my name below, I, Stann Oresung-Kai Tsai, attest that this documentation has been prepared under the direction and in the presence of Meredith StaggersJeffrey Jassiel Flye, MD. Electronically Signed: Stann Oresung-Kai Tsai, Scribe. 06/01/2017 , 3:17 PM .  Patient was seen in Room 11 .   Patient ID: Erin [name], female    DOB: 05-16-1950, 67 y.o.   MRN: [id number] Chief Complaint  Patient presents with  . Ear Pain    pt state she has been having left ear pain x 3 days   . Nasal Congestion    with some drainage    HPI Erin [name] is a 67 y.o. female Patient complains of nasal congestion and left ear pain for the past 3 days. She states her left ear feels blocked, but can still hear. She's taken benadryl this morning with some relief, "feeling drier". She denies having problems with seasonal allergies in the past. She denies fever. She denies putting ear drops in her ear. 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   Patient Active Problem List   Diagnosis Date Noted  . Alcohol abuse 11/08/2016   Past Medical History:  Diagnosis Date  . Alcoholism (HCC)   . Depression   . Hyperlipemia    Past Surgical History:  Procedure Laterality Date  . NOSE SURGERY     Allergies  Allergen Reactions  . Lamictal [Lamotrigine] Swelling    Caused swelling in the face  . Vistaril [Hydroxyzine Hcl] Swelling    Caused swelling in the face   Prior to Admission medications   Medication Sig Start Date End Date Taking? Authorizing Provider  atorvastatin (LIPITOR) 10 MG tablet Take 10 mg by mouth daily.    [provider]  chlordiazePOXIDE (LIBRIUM) 25 MG capsule 50mg  PO TID x 1D, then 25-50mg  PO BID X 1D, then 25-50mg  PO QD X 1D 08/18/16   Little, Ambrose Finlandachel Morgan, MD  FLUoxetine (PROZAC) 40 MG capsule Take 40 mg by mouth daily.    [provider]  HYDROcodone-acetaminophen (NORCO/VICODIN) 5-325 MG per tablet Take 2 tablets by mouth every 4  (four) hours as needed for pain. Patient not taking: Reported on 08/17/2016 04/01/12   Muthersbaugh, Dahlia ClientHannah, PA-C  lurasidone (LATUDA) 40 MG TABS tablet Take 20 mg by mouth daily with breakfast.    [provider]   Social History   Socioeconomic History  . Marital status: Legally Separated    Spouse name: Not on file  . Number of children: Not on file  . Years of education: Not on file  . Highest education level: Not on file  Occupational History  . Not on file  Social Needs  . Financial resource strain: Not on file  . Food insecurity:    Worry: Not on file    Inability: Not on file  . Transportation needs:    Medical: Not on file    Non-medical: Not on file  Tobacco Use  . Smoking status: Current Every Day Smoker    Packs/day: 1.00    Types: Cigarettes  . Smokeless tobacco: Never Used  Substance and Sexual Activity  . Alcohol use: No  . Drug use: No  . Sexual activity: Not on file  Lifestyle  . Physical activity:    Days per week: Not on file    Minutes per session: Not on file  . Stress: Not on file  Relationships  . Social connections:    Talks on phone: Not on file  Gets together: Not on file    Attends religious service: Not on file    Active member of club or organization: Not on file    Attends meetings of clubs or organizations: Not on file    Relationship status: Not on file  . Intimate partner violence:    Fear of current or ex partner: Not on file    Emotionally abused: Not on file    Physically abused: Not on file    Forced sexual activity: Not on file  Other Topics Concern  . Not on file  Social History Narrative  . Not on file   Review of Systems  Constitutional: Negative for chills, diaphoresis, fatigue, fever and unexpected weight change.  HENT: Positive for congestion, ear pain (left) and rhinorrhea. Negative for ear discharge and hearing loss.   Respiratory: Negative for cough.   Gastrointestinal: Negative for constipation,  diarrhea, nausea and vomiting.  Skin: Negative for rash and wound.  Neurological: Negative for dizziness, weakness and headaches.       Objective:   Physical Exam  Constitutional: She is oriented to person, place, and time. She appears well-developed and well-nourished. No distress.  HENT:  Head: Normocephalic and atraumatic.  Right Ear: Tympanic membrane normal.  Left Ear: Ear canal normal. Tympanic membrane is erythematous and retracted. A middle ear effusion (clear to light yellow at the base of left TM) is present.  Nose: Mucosal edema (L>R) present.  Eyes: Pupils are equal, round, and reactive to light. EOM are normal.  Neck: Neck supple.  Cardiovascular: Normal rate, regular rhythm and normal heart sounds.  No murmur heard. Pulmonary/Chest: Effort normal and breath sounds normal. No respiratory distress. She has no wheezes.  Musculoskeletal: Normal range of motion.  Neurological: She is alert and oriented to person, place, and time.  Skin: Skin is warm and dry.  Psychiatric: She has a normal mood and affect. Her behavior is normal.  Nursing note and vitals reviewed.   Vitals:   06/01/17 1453  BP: 120/82  Pulse: 83  Resp: 16  Temp: 98 F (36.7 C)  TempSrc: Oral  SpO2: 98%  Weight: 180 lb 3.2 oz (81.7 kg)  Height: 5' 7.32" (1.71 m)       Assessment & Plan:   Erin [name] is a 67 y.o. female Left otitis media with effusion - Plan: amoxicillin (AMOXIL) 875 MG tablet  Acute upper respiratory infection - Plan: ipratropium (ATROVENT) 0.03 % nasal spray   Suspected upper respiratory infection versus allergies with secondary serous otitis.  Does have some component of slight discoloration of fluid as well as slight injection, early otitis possible.  -Start Atrovent nasal spray, symptomatic care for congestion, amoxicillin printed if your pain is not improving in the next 1 to 2 days, RTC precautions given.  Meds ordered this encounter  Medications  .  ipratropium (ATROVENT) 0.03 % nasal spray    Sig: Place 2 sprays into both nostrils 2 (two) times daily as needed for rhinitis.    Dispense:  30 mL    Refill:  1  . amoxicillin (AMOXIL) 875 MG tablet    Sig: Take 1 tablet (875 mg total) by mouth 2 (two) times daily.    Dispense:  20 tablet    Refill:  0   Patient Instructions   Atrovent nasal spray twice per day as needed for congestion.  If ear pain is not improving in the next day or two, can start antibiotic.   Return to the clinic  or go to the nearest emergency room if any of your symptoms worsen or new symptoms occur.     IF you received an x-ray today, you will receive an invoice from [facility]. Please contact [facility] at [phone] with questions or concerns regarding your invoice.   IF you received labwork today, you will receive an invoice from [facility]. Please contact [facility] at [phone] with questions or concerns regarding your invoice.   Our billing staff will not be able to assist you with questions regarding bills from these companies.  You will be contacted with the lab results as soon as they are available. The fastest way to get your results is to activate your My Chart account. Instructions are located on the last page of this paperwork. If you have not heard from [place] regarding the results in 2 weeks, please contact this office.       I personally performed the services described in this documentation, which was scribed in my presence. The recorded information has been reviewed and considered for accuracy and completeness, addended by me as needed, and agree with information above.  Signed,   Meredith Staggers, MD Primary Care at [facility].  06/01/17 3:25 PM

## 2017-08-20 ENCOUNTER — Other Ambulatory Visit: Payer: Self-pay | Admitting: Family Medicine

## 2017-08-20 DIAGNOSIS — J069 Acute upper respiratory infection, unspecified: Secondary | ICD-10-CM

## 2018-05-02 ENCOUNTER — Other Ambulatory Visit: Payer: Self-pay | Admitting: Family Medicine

## 2018-05-02 DIAGNOSIS — J069 Acute upper respiratory infection, unspecified: Secondary | ICD-10-CM

## 2018-05-02 NOTE — Telephone Encounter (Signed)
Requested medication (s) are due for refill today: yes  Requested medication (s) are on the active medication list: yes    Last refill: 06/01/17  Future visit scheduled no  Notes to clinic:off protocol  Requested Prescriptions  Pending Prescriptions Disp Refills   ipratropium (ATROVENT) 0.03 % nasal spray [Pharmacy Med Name: IPRATROPIUM 0.03% SPRAY] 30 mL 1    Sig: Place 2 sprays into both nostrils 2 (two) times daily as needed for rhinitis.     Off-Protocol Failed - 05/02/2018  9:09 AM      Failed - Medication not assigned to a protocol, review manually.      Passed - Valid encounter within last 12 months    Recent Outpatient Visits          11 months ago Left otitis media with effusion   Primary Care at [facility], Asencion Partridge, MD

## 2018-10-01 ENCOUNTER — Other Ambulatory Visit: Payer: Self-pay | Admitting: Physician Assistant

## 2018-10-01 DIAGNOSIS — Z122 Encounter for screening for malignant neoplasm of respiratory organs: Secondary | ICD-10-CM

## 2019-03-23 ENCOUNTER — Ambulatory Visit: Payer: Self-pay

## 2019-04-07 ENCOUNTER — Ambulatory Visit: Payer: BLUE CROSS/BLUE SHIELD

## 2019-10-20 ENCOUNTER — Ambulatory Visit: Payer: Self-pay

## 2019-10-20 ENCOUNTER — Ambulatory Visit
Admission: EM | Admit: 2019-10-20 | Discharge: 2019-10-20 | Disposition: A | Discharge status: Home / Self Care | Payer: Managed Care, Other (non HMO) | Attending: Emergency Medicine | Admitting: Emergency Medicine

## 2019-10-20 DIAGNOSIS — R0602 Shortness of breath: Secondary | ICD-10-CM | POA: Diagnosis not present

## 2019-10-20 DIAGNOSIS — J069 Acute upper respiratory infection, unspecified: Secondary | ICD-10-CM | POA: Diagnosis not present

## 2019-10-20 DIAGNOSIS — Z1152 Encounter for screening for COVID-19: Secondary | ICD-10-CM

## 2019-10-20 MED ORDER — PREDNISONE 10 MG PO TABS: 20.0000 mg | ORAL_TABLET | Freq: Every day | ORAL | 0 refills | Status: DC

## 2019-10-20 MED ORDER — ALBUTEROL SULFATE HFA 108 (90 BASE) MCG/ACT IN AERS: 1.0000 | INHALATION_SPRAY | Freq: Four times a day (QID) | RESPIRATORY_TRACT | 0 refills | Status: DC | PRN

## 2019-10-20 MED ORDER — BENZONATATE 100 MG PO CAPS: 100.0000 mg | ORAL_CAPSULE | Freq: Three times a day (TID) | ORAL | 0 refills | Status: DC

## 2019-10-20 NOTE — ED Provider Notes (Signed)
[facility]   [id number] 10/20/19 Arrival Time: 0957   CC: COVID symptoms  SUBJECTIVE: History from: patient.  Erin [name] is a 69 y.o. female who presented to the urgent care for complaint of fever, shortness of breath, cough that started this past Thursday.  Denies sick exposure to COVID, flu or strep.  Denies recent travel.  Has tried OTC medication without relief.  Denies aggravating or alleviating factors.  Reports/ denies previous symptoms in the past.   Denies fever, chills, fatigue, sinus pain, rhinorrhea, sore throat,  wheezing, chest pain, nausea, changes in bowel or bladder habits.     ROS: As per HPI.  All other pertinent ROS negative.     Past Medical History:  Diagnosis Date   Alcoholism (HCC)    Depression    Hyperlipemia    Past Surgical History:  Procedure Laterality Date   NOSE SURGERY     Allergies  Allergen Reactions   Lamictal [Lamotrigine] Swelling    Caused swelling in the face   Vistaril [Hydroxyzine Hcl] Swelling    Caused swelling in the face   No current facility-administered medications on file prior to encounter.   Current Outpatient Medications on File Prior to Encounter  Medication Sig Dispense Refill   amoxicillin (AMOXIL) 875 MG tablet Take 1 tablet (875 mg total) by mouth 2 (two) times daily. 20 tablet 0   atorvastatin (LIPITOR) 10 MG tablet Take 10 mg by mouth daily.     chlordiazePOXIDE (LIBRIUM) 25 MG capsule 50mg  PO TID x 1D, then 25-50mg  PO BID X 1D, then 25-50mg  PO QD X 1D 10 capsule 0   diclofenac (VOLTAREN) 75 MG EC tablet Take by mouth.     FLUoxetine (PROZAC) 40 MG capsule Take 40 mg by mouth daily.     HYDROcodone-acetaminophen (NORCO/VICODIN) 5-325 MG per tablet Take 2 tablets by mouth every 4 (four) hours as needed for pain. 10 tablet 0   hydrOXYzine (ATARAX/VISTARIL) 10 MG tablet Take by mouth.     ipratropium (ATROVENT) 0.03 % nasal spray Place 2 sprays into both nostrils 2 (two) times  daily as needed for rhinitis. 30 mL 1   lurasidone (LATUDA) 40 MG TABS tablet Take 20 mg by mouth daily with breakfast.     Social History   Socioeconomic History   Marital status: Legally Separated    Spouse name: Not on file   Number of children: Not on file   Years of education: Not on file   Highest education level: Not on file  Occupational History   Not on file  Tobacco Use   Smoking status: Current Every Day Smoker    Packs/day: 1.00    Types: Cigarettes   Smokeless tobacco: Never Used  Substance and Sexual Activity   Alcohol use: No   Drug use: No   Sexual activity: Not on file  Other Topics Concern   Not on file  Social History Narrative   Not on file   Social Determinants of Health   Financial Resource Strain:    Difficulty of Paying Living Expenses: Not on file  Food Insecurity:    Worried About Running Out of Food in the Last Year: Not on file   Ran Out of Food in the Last Year: Not on file  Transportation Needs:    Lack of Transportation (Medical): Not on file   Lack of Transportation (Non-Medical): Not on file  Physical Activity:    Days of Exercise per Week: Not on file  Minutes of Exercise per Session: Not on file  Stress:    Feeling of Stress : Not on file  Social Connections:    Frequency of Communication with Friends and Family: Not on file   Frequency of Social Gatherings with Friends and Family: Not on file   Attends Religious Services: Not on file   Active Member of Clubs or Organizations: Not on file   Attends Banker Meetings: Not on file   Marital Status: Not on file  Intimate Partner Violence:    Fear of Current or Ex-Partner: Not on file   Emotionally Abused: Not on file   Physically Abused: Not on file   Sexually Abused: Not on file   Family History  Problem Relation Age of Onset   Heart failure Father     OBJECTIVE:  Vitals:   10/20/19 1007  BP: 119/71  Pulse: 94  Resp: 18    Temp: 99 F (37.2 C)  SpO2: 99%     General appearance: alert; appears fatigued, but nontoxic; speaking in full sentences and tolerating own secretions HEENT: NCAT; Ears: EACs clear, TMs pearly gray; Eyes: PERRL.  EOM grossly intact. Sinuses: nontender; Nose: nares patent without rhinorrhea, Throat: oropharynx clear, tonsils non erythematous or enlarged, uvula midline  Neck: supple without LAD Lungs: unlabored respirations, symmetrical air entry; cough: moderate; no respiratory distress; CTAB Heart: regular rate and rhythm.  Radial pulses 2+ symmetrical bilaterally Skin: warm and dry Psychological: alert and cooperative; normal mood and affect  LABS:  No results found for this or any previous visit (from the past 24 hour(s)).   ASSESSMENT & PLAN:  1. Encounter for screening for COVID-19   2. URI with cough and congestion   3. SOB (shortness of breath) on exertion     Meds ordered this encounter  Medications   albuterol (VENTOLIN HFA) 108 (90 Base) MCG/ACT inhaler    Sig: Inhale 1-2 puffs into the lungs every 6 (six) hours as needed for wheezing or shortness of breath.    Dispense:  18 g    Refill:  0   predniSONE (DELTASONE) 10 MG tablet    Sig: Take 2 tablets (20 mg total) by mouth daily.    Dispense:  15 tablet    Refill:  0   benzonatate (TESSALON) 100 MG capsule    Sig: Take 1 capsule (100 mg total) by mouth every 8 (eight) hours.    Dispense:  30 capsule    Refill:  0    Discharge Instructions   COVID testing ordered.  It will take between 2-7 days for test results.  Someone will contact you regarding abnormal results.    In the meantime: You should remain isolated in your home for 10 days from symptom onset AND greater than 72 hours after symptoms resolution (absence of fever without the use of fever-reducing medication and improvement in respiratory symptoms), whichever is longer Get plenty of rest and push fluids Tessalon Perles prescribed for cough ProAir  prescribed for shortness of breath Low-dose prednisone was prescribed Use medications daily for symptom relief Use OTC medications like ibuprofen or tylenol as needed fever or pain Call or go to the ED if you have any new or worsening symptoms such as fever, worsening cough, shortness of breath, chest tightness, chest pain, turning blue, changes in mental status, etc...  Reviewed expectations re: course of current medical issues. Questions answered. Outlined signs and symptoms indicating need for more acute intervention. Patient verbalized understanding. After Visit Summary  given.      Note: This document was prepared using Dragon voice recognition software and may include unintentional dictation errors.    Durward Parcel, FNP 10/20/19 1034

## 2019-10-20 NOTE — ED Triage Notes (Signed)
Pt presents with c/o fever, sob and cough that began Thursday

## 2019-10-20 NOTE — Discharge Instructions (Addendum)
COVID testing ordered.  It will take between 2-7 days for test results.  Someone will contact you regarding abnormal results.    In the meantime: You should remain isolated in your home for 10 days from symptom onset AND greater than 72 hours after symptoms resolution (absence of fever without the use of fever-reducing medication and improvement in respiratory symptoms), whichever is longer Get plenty of rest and push fluids Tessalon Perles prescribed for cough ProAir prescribed for shortness of breath Low-dose prednisone was prescribed Use medications daily for symptom relief Use OTC medications like ibuprofen or tylenol as needed fever or pain Call or go to the ED if you have any new or worsening symptoms such as fever, worsening cough, shortness of breath, chest tightness, chest pain, turning blue, changes in mental status, etc..Marland Kitchen

## 2019-10-21 ENCOUNTER — Other Ambulatory Visit: Payer: Self-pay

## 2019-10-23 LAB — NOVEL CORONAVIRUS, NAA: SARS-CoV-2, NAA: NOT DETECTED

## 2020-05-16 ENCOUNTER — Encounter (HOSPITAL_COMMUNITY): Payer: Self-pay | Admitting: *Deleted

## 2020-05-16 ENCOUNTER — Encounter (HOSPITAL_COMMUNITY): Payer: Self-pay | Admitting: Emergency Medicine

## 2020-05-16 ENCOUNTER — Ambulatory Visit (HOSPITAL_COMMUNITY)
Admission: EM | Admit: 2020-05-16 | Discharge: 2020-05-16 | Disposition: A | Discharge status: Home / Self Care | Payer: Medicare Other

## 2020-05-16 ENCOUNTER — Emergency Department (HOSPITAL_COMMUNITY)
Admission: EM | Admit: 2020-05-16 | Discharge: 2020-05-17 | Disposition: A | Discharge status: Home / Self Care | Payer: Medicare Other | Attending: Emergency Medicine | Admitting: Emergency Medicine

## 2020-05-16 ENCOUNTER — Other Ambulatory Visit: Payer: Self-pay

## 2020-05-16 DIAGNOSIS — Z79899 Other long term (current) drug therapy: Secondary | ICD-10-CM | POA: Insufficient documentation

## 2020-05-16 DIAGNOSIS — F17210 Nicotine dependence, cigarettes, uncomplicated: Secondary | ICD-10-CM | POA: Insufficient documentation

## 2020-05-16 DIAGNOSIS — F1021 Alcohol dependence, in remission: Secondary | ICD-10-CM

## 2020-05-16 DIAGNOSIS — L03317 Cellulitis of buttock: Secondary | ICD-10-CM

## 2020-05-16 DIAGNOSIS — L309 Dermatitis, unspecified: Secondary | ICD-10-CM | POA: Diagnosis not present

## 2020-05-16 DIAGNOSIS — R21 Rash and other nonspecific skin eruption: Secondary | ICD-10-CM | POA: Diagnosis present

## 2020-05-16 DIAGNOSIS — F1010 Alcohol abuse, uncomplicated: Secondary | ICD-10-CM

## 2020-05-16 LAB — CBC WITH DIFFERENTIAL/PLATELET
Abs Immature Granulocytes: 0.09 10*3/uL — ABNORMAL HIGH (ref 0.00–0.07)
Basophils Absolute: 0 10*3/uL (ref 0.0–0.1)
Basophils Relative: 0 %
Eosinophils Absolute: 0 10*3/uL (ref 0.0–0.5)
Eosinophils Relative: 1 %
HCT: 37.4 % (ref 36.0–46.0)
Hemoglobin: 12.9 g/dL (ref 12.0–15.0)
Immature Granulocytes: 1 %
Lymphocytes Relative: 22 %
Lymphs Abs: 1.9 10*3/uL (ref 0.7–4.0)
MCH: 29.1 pg (ref 26.0–34.0)
MCHC: 34.5 g/dL (ref 30.0–36.0)
MCV: 84.4 fL (ref 80.0–100.0)
Monocytes Absolute: 0.8 10*3/uL (ref 0.1–1.0)
Monocytes Relative: 9 %
Neutro Abs: 5.6 10*3/uL (ref 1.7–7.7)
Neutrophils Relative %: 67 %
Platelets: 236 10*3/uL (ref 150–400)
RBC: 4.43 MIL/uL (ref 3.87–5.11)
RDW: 14.6 % (ref 11.5–15.5)
WBC: 8.4 10*3/uL (ref 4.0–10.5)
nRBC: 0 % (ref 0.0–0.2)

## 2020-05-16 LAB — PROTIME-INR
INR: 1.1 (ref 0.8–1.2)
Prothrombin Time: 13.3 seconds (ref 11.4–15.2)

## 2020-05-16 LAB — COMPREHENSIVE METABOLIC PANEL
ALT: 62 U/L — ABNORMAL HIGH (ref 0–44)
AST: 66 U/L — ABNORMAL HIGH (ref 15–41)
Albumin: 3.5 g/dL (ref 3.5–5.0)
Alkaline Phosphatase: 117 U/L (ref 38–126)
Anion gap: 12 (ref 5–15)
BUN: 13 mg/dL (ref 8–23)
CO2: 27 mmol/L (ref 22–32)
Calcium: 9.7 mg/dL (ref 8.9–10.3)
Chloride: 84 mmol/L — ABNORMAL LOW (ref 98–111)
Creatinine, Ser: 1.05 mg/dL — ABNORMAL HIGH (ref 0.44–1.00)
GFR, Estimated: 58 mL/min — ABNORMAL LOW (ref >60)
Glucose, Bld: 140 mg/dL — ABNORMAL HIGH (ref 70–99)
Potassium: 4 mmol/L (ref 3.5–5.1)
Sodium: 123 mmol/L — ABNORMAL LOW (ref 135–145)
Total Bilirubin: 1 mg/dL (ref 0.3–1.2)
Total Protein: 6.8 g/dL (ref 6.5–8.1)

## 2020-05-16 LAB — ETHANOL: Alcohol, Ethyl (B): <10 mg/dL (ref <10)

## 2020-05-16 NOTE — ED Notes (Signed)
Patient brief changed and pad placed under patient

## 2020-05-16 NOTE — ED Provider Notes (Signed)
[facility]    CSN: [id number] Arrival date & time: 05/16/20  1517      History   Chief Complaint Chief Complaint  Patient presents with  . Rash    HPI Erin [name] is a 70 y.o. female presenting with skin infection. History alcoholism, depression.  States she has been sober for 2 years, but last week went on a alcohol binge and subsequently laying in her own urine on her couch for 5 days.  Painful red rash on bilateral buttocks, denies discharge.  Stopped drinking 1 day ago and states she is feeling "pretty lousy".  Denies history of delirium tremens, denies current auditory or visual hallucinations, tremors.  Denies chest pain, shortness of breath, dizziness, weakness. Denies fevers/chills.  HPI  Past Medical History:  Diagnosis Date  . Alcoholism (HCC)   . Depression   . Hyperlipemia     Patient Active Problem List   Diagnosis Date Noted  . Alcohol abuse 11/08/2016    Past Surgical History:  Procedure Laterality Date  . NOSE SURGERY      OB History   No obstetric history on file.      Home Medications    Prior to Admission medications   Medication Sig Start Date End Date Taking? Authorizing Provider  atorvastatin (LIPITOR) 10 MG tablet Take 10 mg by mouth daily.   Yes [provider]  FLUoxetine (PROZAC) 40 MG capsule Take 40 mg by mouth daily.   Yes [provider]  hydrOXYzine (ATARAX/VISTARIL) 10 MG tablet Take by mouth. 11/06/16  Yes [provider]  lurasidone (LATUDA) 40 MG TABS tablet Take 20 mg by mouth daily with breakfast.   Yes [provider]  albuterol (VENTOLIN HFA) 108 (90 Base) MCG/ACT inhaler Inhale 1-2 puffs into the lungs every 6 (six) hours as needed for wheezing or shortness of breath. 10/20/19   Avegno, Zachery Dakins, FNP  amoxicillin (AMOXIL) 875 MG tablet Take 1 tablet (875 mg total) by mouth 2 (two) times daily. 06/01/17   Shade Flood, MD  benzonatate (TESSALON) 100 MG capsule Take  1 capsule (100 mg total) by mouth every 8 (eight) hours. 10/20/19   Avegno, Zachery Dakins, FNP  chlordiazePOXIDE (LIBRIUM) 25 MG capsule 50mg  PO TID x 1D, then 25-50mg  PO BID X 1D, then 25-50mg  PO QD X 1D 08/18/16   Little, 10/19/16, MD  diclofenac (VOLTAREN) 75 MG EC tablet Take by mouth. 12/15/16   [provider]  HYDROcodone-acetaminophen (NORCO/VICODIN) 5-325 MG per tablet Take 2 tablets by mouth every 4 (four) hours as needed for pain. 04/01/12   Muthersbaugh, 04/03/12, PA-C  ipratropium (ATROVENT) 0.03 % nasal spray Place 2 sprays into both nostrils 2 (two) times daily as needed for rhinitis. 06/01/17   06/03/17, MD  predniSONE (DELTASONE) 10 MG tablet Take 2 tablets (20 mg total) by mouth daily. 10/20/19   Avegno11/6/21, FNP    Family History Family History  Problem Relation Age of Onset  . Heart failure Father     Social History Social History   Tobacco Use  . Smoking status: Current Every Day Smoker    Packs/day: 1.00    Types: Cigarettes  . Smokeless tobacco: Never Used  Vaping Use  . Vaping Use: Never used  Substance Use Topics  . Alcohol use: Yes    Comment: Hx alcholism; stopped 1 day ago following a binge  . Drug use: No     Allergies   Lamictal [lamotrigine] and  Vistaril [hydroxyzine hcl]   Review of Systems Review of Systems  Constitutional: Negative for chills and fever.  Skin: Positive for rash.  All other systems reviewed and are negative.    Physical Exam Triage Vital Signs ED Triage Vitals  Enc Vitals Group     BP 05/16/20 1609 (!) 143/85     Pulse Rate 05/16/20 1609 (!) 115     Resp 05/16/20 1609 20     Temp 05/16/20 1609 97.9 F (36.6 C)     Temp Source 05/16/20 1609 Oral     SpO2 05/16/20 1609 99 %     Weight --      Height --      Head Circumference --      Peak Flow --      Pain Score 05/16/20 1612 8     Pain Loc --      Pain Edu? --      Excl. in GC? --    No data found.  Updated Vital Signs BP (!) 143/85    Pulse (!) 115   Temp 97.9 F (36.6 C) (Oral)   Resp 20   SpO2 99%   Visual Acuity Right Eye Distance:   Left Eye Distance:   Bilateral Distance:    Right Eye Near:   Left Eye Near:    Bilateral Near:     Physical Exam Vitals reviewed.  Constitutional:      Appearance: Normal appearance.  HENT:     Head: Normocephalic and atraumatic.  Cardiovascular:     Rate and Rhythm: Regular rhythm. Tachycardia present.     Heart sounds: Normal heart sounds.  Pulmonary:     Effort: Pulmonary effort is normal.     Breath sounds: Normal breath sounds.  Skin:    Capillary Refill: Capillary refill takes less than 2 seconds.          Comments: Bilateral buttocks with EXTENSIVE erythematous rash with skin peeling. Warm to the touch. No discharge or active bleeding.  Neurological:     General: No focal deficit present.     Mental Status: She is alert and oriented to person, place, and time.  Psychiatric:        Mood and Affect: Mood normal.        Behavior: Behavior normal.        Thought Content: Thought content normal.        Judgment: Judgment normal.      UC Treatments / Results  Labs (all labs ordered are listed, but only abnormal results are displayed) Labs Reviewed - No data to display  EKG   Radiology No results found.  Procedures Procedures (including critical care time)  Medications Ordered in UC Medications - No data to display  Initial Impression / Assessment and Plan / UC Course  I have reviewed the triage vital signs and the nursing notes.  Pertinent labs & imaging results that were available during my care of the patient were reviewed by me and considered in my medical decision making (see chart for details).     This patient is a 70 year old female presenting with cellulitis following alcohol binge and falling asleep in her own urine for 5 days.  She is tachycardic but afebrile and nontachypneic.  I am sending this patient to [facility] emergency room  for further evaluation and management of extensive cellulitis and alcohol detox.  I have some concern for sepsis given tachycardia, which can be fatal without proper antibiotic management including IV  antibiotics.  This is not possible in the urgent care setting.  Alcohol detox can also be fatal and must be monitored in an emergency room setting.  This chart was dictated using voice recognition software, Dragon. Despite the best efforts of this provider to proofread and correct errors, errors may still occur which can change documentation meaning.   Final Clinical Impressions(s) / UC Diagnoses   Final diagnoses:  Cellulitis of multiple sites of buttock  History of alcoholism (HCC)  Alcohol consumption binge drinking     Discharge Instructions     -Head straight to [facility] emergency room for further evaluation management of your skin infection. -If you experience dizziness, chest pain, shortness of breath, etc. while on the way to the ER, stop and call 911 immediately.     ED Prescriptions    None     PDMP not reviewed this encounter.   Rhys Martini, PA-C 05/16/20 1656

## 2020-05-16 NOTE — ED Provider Notes (Signed)
MSE was initiated and I personally evaluated the patient and placed orders (if any) at  5:40 PM on May 16, 2020.  Patient placed in Quick Look pathway, seen and evaluated   Chief Complaint: fatigue, buttocks pain, urinary frequency  HPI:   First etoh use in 2 years was M-Th last week. Was asleep on couch for the entire time.  She says she did not eat or drink.  She states she feels very fatigued and weak now.  She states that she has not had any abdominal pain, nausea, vomiting, fevers or chills.  Denies any hematuria, urinary urgency or dysuria but does state that she has noticed she has been more frequently.  She went to urgent care and was sent here with concern for sepsis.  ROS: Fatigue, rash to buttocks (one)  Physical Exam:   Gen: No distress  Neuro: Awake and Alert  Skin: Warm    Focused Exam: Patient has diffuse scaling dried skin with bruising/skin discoloration and what appears to be petechial bruising to her right buttocks.  This is diffuse and spans the area from her right hip to her midline right buttocks.  Patient agrees to wait in waiting room and will not leave until evaluated in major care.  Initiation of care has begun. The patient has been counseled on the process, plan, and necessity for staying for the completion/evaluation, and the remainder of the medical screening examination   The patient appears stable so that the remainder of the MSE may be completed by another provider.   Solon Augusta [place], [place] 05/16/20 1749    Koleen Distance, MD 05/16/20 1754

## 2020-05-16 NOTE — Discharge Instructions (Addendum)
-  Head straight to [facility] emergency room for further evaluation management of your skin infection. -If you experience dizziness, chest pain, shortness of breath, etc. while on the way to the ER, stop and call 911 immediately.

## 2020-05-16 NOTE — ED Triage Notes (Signed)
Pt sent from [facility] for painful rash to buttocks and pressure wound on buttocks x 2 days.  States she layed in urine for 2 days after binge drinking.  Stopped drinking 1 day ago.

## 2020-05-16 NOTE — ED Triage Notes (Addendum)
C/O non-pruritic, painful rash to entire buttocks x 2 days without fever.  Large area of redness and dark scabbing noted.  Pt states has hx alcoholism and had been sober x 2 yrs, but went on a binge last week, and "layed in urine on a couch x 5 days".  States stopped drinking 1 day ago "and I'm feeling pretty crappy".  Denies hx DTs.

## 2020-05-17 DIAGNOSIS — L309 Dermatitis, unspecified: Secondary | ICD-10-CM | POA: Diagnosis not present

## 2020-05-17 MED ORDER — SODIUM CHLORIDE 0.9 % IV BOLUS
500.0000 mL | Freq: Once | INTRAVENOUS | Status: AC
Start: 2020-05-17 — End: 2020-05-17
  Administered 2020-05-17: 500 mL via INTRAVENOUS

## 2020-05-17 MED ORDER — DOXYCYCLINE HYCLATE 100 MG PO TABS
100.0000 mg | ORAL_TABLET | Freq: Once | ORAL | Status: AC
  Administered 2020-05-17: 100 mg via ORAL
  Filled 2020-05-17: qty 1

## 2020-05-17 MED ORDER — DOXYCYCLINE HYCLATE 100 MG PO CAPS: 100.0000 mg | ORAL_CAPSULE | Freq: Two times a day (BID) | ORAL | 0 refills | Status: DC

## 2020-05-17 MED ORDER — SILVER SULFADIAZINE 1 % EX CREA: 1.0000 "application " | TOPICAL_CREAM | Freq: Two times a day (BID) | CUTANEOUS | 0 refills | Status: DC

## 2020-05-17 MED ORDER — SILVER SULFADIAZINE 1 % EX CREA
TOPICAL_CREAM | Freq: Once | CUTANEOUS | Status: AC
  Filled 2020-05-17: qty 85

## 2020-05-17 NOTE — ED Notes (Signed)
All appropriate discharge materials reviewed at length with patient. Time for questions provided. Pt has no other questions at this time and verbalizes understanding of all provided materials.  

## 2020-05-17 NOTE — ED Provider Notes (Signed)
[facility] EMERGENCY DEPARTMENT Provider Note   CSN: [id number] Arrival date & time: 05/16/20  1707     History Chief Complaint  Patient presents with  . wound on buttocks    Erin [name] is a 70 y.o. female.  Patient presents to the emergency department after being evaluated at urgent care.  Patient reports that she does have a history of alcoholism.  She had been sober for 2 years but then had an episode of binge drinking this week.  She reports that she spent approximately 4 days on her couch, drinking continuously.  She does report that she was wearing a pair of denim jeans.  She reports that she urinated in her jeans and sat in the wet floor most of the time.  She has noticed a rash on her buttocks and groin area.        Past Medical History:  Diagnosis Date  . Alcoholism (HCC)   . Depression   . Hyperlipemia     Patient Active Problem List   Diagnosis Date Noted  . Alcohol abuse 11/08/2016    Past Surgical History:  Procedure Laterality Date  . NOSE SURGERY       OB History   No obstetric history on file.     Family History  Problem Relation Age of Onset  . Heart failure Father     Social History   Tobacco Use  . Smoking status: Current Every Day Smoker    Packs/day: 1.00    Types: Cigarettes  . Smokeless tobacco: Never Used  Vaping Use  . Vaping Use: Never used  Substance Use Topics  . Alcohol use: Yes    Comment: Hx alcholism; stopped 1 day ago following a binge  . Drug use: No    Home Medications Prior to Admission medications   Medication Sig Start Date End Date Taking? Authorizing Provider  albuterol (VENTOLIN HFA) 108 (90 Base) MCG/ACT inhaler Inhale 1-2 puffs into the lungs every 6 (six) hours as needed for wheezing or shortness of breath. 10/20/19  Yes Avegno, Zachery Dakins, FNP  atorvastatin (LIPITOR) 20 MG tablet Take 20 mg by mouth every evening. 02/25/20  Yes [provider]  buPROPion (WELLBUTRIN  XL) 150 MG 24 hr tablet Take 150 mg by mouth every morning. 03/20/20  Yes [provider]  cloNIDine (CATAPRES) 0.1 MG tablet Take 0.1 mg by mouth at bedtime. 03/20/20  Yes [provider]  doxycycline (VIBRAMYCIN) 100 MG capsule Take 1 capsule (100 mg total) by mouth 2 (two) times daily. 05/17/20  Yes Townsend Cudworth, Canary Brim, MD  hydrOXYzine (ATARAX/VISTARIL) 25 MG tablet Take 25 mg by mouth daily. 03/04/20  Yes [provider]  LATUDA 80 MG TABS tablet Take 40 mg by mouth at bedtime. 01/13/20  Yes [provider]  silver sulfADIAZINE (SILVADENE) 1 % cream Apply 1 application topically 2 (two) times daily. 05/17/20  Yes Dawud Mays, Canary Brim, MD  benzonatate (TESSALON) 100 MG capsule Take 1 capsule (100 mg total) by mouth every 8 (eight) hours. Patient not taking: No sig reported 10/20/19   Avegno, Zachery Dakins, FNP  chlordiazePOXIDE (LIBRIUM) 25 MG capsule 50mg  PO TID x 1D, then 25-50mg  PO BID X 1D, then 25-50mg  PO QD X 1D Patient not taking: Reported on 05/17/2020 08/18/16   Little, 10/19/16, MD  predniSONE (DELTASONE) 10 MG tablet Take 2 tablets (20 mg total) by mouth daily. Patient not taking: No sig reported 10/20/19   12/20/19, FNP  Allergies    Lamictal [lamotrigine] and Vistaril [hydroxyzine hcl]  Review of Systems   Review of Systems  Constitutional: Positive for fatigue.  Skin: Positive for rash.  All other systems reviewed and are negative.   Physical Exam Updated Vital Signs BP 138/71 (BP Location: Right Arm)   Pulse 89   Temp 98 F (36.7 C) (Oral)   Resp (!) 24   Ht 5\' 9"  (1.753 m)   Wt 96.2 kg   SpO2 100%   BMI 31.31 kg/m   Physical Exam Vitals and nursing note reviewed.  Constitutional:      General: She is not in acute distress.    Appearance: Normal appearance. She is well-developed.  HENT:     Head: Normocephalic and atraumatic.     Right Ear: Hearing normal.     Left Ear: Hearing normal.     Nose: Nose normal.  Eyes:      Conjunctiva/sclera: Conjunctivae normal.     Pupils: Pupils are equal, round, and reactive to light.  Cardiovascular:     Rate and Rhythm: Regular rhythm.     Heart sounds: S1 normal and S2 normal. No murmur heard. No friction rub. No gallop.   Pulmonary:     Effort: Pulmonary effort is normal. No respiratory distress.     Breath sounds: Normal breath sounds.  Chest:     Chest wall: No tenderness.  Abdominal:     General: Bowel sounds are normal.     Palpations: Abdomen is soft.     Tenderness: There is no abdominal tenderness. There is no guarding or rebound. Negative signs include Murphy's sign and McBurney's sign.     Hernia: No hernia is present.  Musculoskeletal:        General: Normal range of motion.     Cervical back: Normal range of motion and neck supple.  Skin:    General: Skin is warm and dry.     Findings: Rash (Diffuse areas of skin breakdown on sacral area, buttocks and groin.  Partial-thickness skin desquamation and some scabbing present.  No drainage.  No significant warmth.  There is erythema present in the area of breakdown.) present.  Neurological:     Mental Status: She is alert and oriented to person, place, and time.     GCS: GCS eye subscore is 4. GCS verbal subscore is 5. GCS motor subscore is 6.     Cranial Nerves: No cranial nerve deficit.     Sensory: No sensory deficit.     Coordination: Coordination normal.  Psychiatric:        Speech: Speech normal.        Behavior: Behavior normal.        Thought Content: Thought content normal.     ED Results / Procedures / Treatments   Labs (all labs ordered are listed, but only abnormal results are displayed) Labs Reviewed  COMPREHENSIVE METABOLIC PANEL - Abnormal; Notable for the following components:      Result Value   Sodium 123 (*)    Chloride 84 (*)    Glucose, Bld 140 (*)    Creatinine, Ser 1.05 (*)    AST 66 (*)    ALT 62 (*)    GFR, Estimated 58 (*)    All other components within normal  limits  CBC WITH DIFFERENTIAL/PLATELET - Abnormal; Notable for the following components:   Abs Immature Granulocytes 0.09 (*)    All other components within normal limits  ETHANOL  PROTIME-INR  URINALYSIS, ROUTINE W REFLEX MICROSCOPIC  RAPID URINE DRUG SCREEN, HOSP PERFORMED    EKG None  Radiology No results found.  Procedures Procedures   Medications Ordered in ED Medications  sodium chloride 0.9 % bolus 500 mL (has no administration in time range)  silver sulfADIAZINE (SILVADENE) 1 % cream (has no administration in time range)  doxycycline (VIBRA-TABS) tablet 100 mg (has no administration in time range)    ED Course  I have reviewed the triage vital signs and the nursing notes.  Pertinent labs & imaging results that were available during my care of the patient were reviewed by me and considered in my medical decision making (see chart for details).    MDM Rules/Calculators/A&P                          Patient does have an area of dermatitis where she sat in wet pants for a number of days.  There is some erythema present but there is no warmth to the area.  It does not clearly look like a cellulitis.  She is afebrile.  No elevated white blood cell count.  Suspect that some of this is severe " diaper rash" with excoriation and skin breakdown.  Cannot rule out superinfection and therefore will cover.  She is not toxic and does not appear septic.  Will not require hospitalization at this time.  She likely has some slight dehydration and hyponatremia secondary to her binge drinking.  Will give IV fluids.  Instructed on local wound care and will discharge with doxycycline.  She is to follow-up with primary care in 2 or 3 days for recheck and return to the ER if area worsens or she develops a fever.  Final Clinical Impression(s) / ED Diagnoses Final diagnoses:  Dermatitis    Rx / DC Orders ED Discharge Orders         Ordered    doxycycline (VIBRAMYCIN) 100 MG capsule  2 times  daily        05/17/20 0153    silver sulfADIAZINE (SILVADENE) 1 % cream  2 times daily        05/17/20 0153           Gilda Crease, MD 05/17/20 [phone]

## 2020-05-27 ENCOUNTER — Other Ambulatory Visit: Payer: Self-pay | Admitting: Physician Assistant

## 2020-05-27 DIAGNOSIS — Z87891 Personal history of nicotine dependence: Secondary | ICD-10-CM

## 2020-10-16 ENCOUNTER — Encounter (HOSPITAL_COMMUNITY): Payer: Self-pay

## 2020-10-16 ENCOUNTER — Ambulatory Visit (HOSPITAL_COMMUNITY)
Admission: EM | Admit: 2020-10-16 | Discharge: 2020-10-16 | Disposition: A | Discharge status: Home / Self Care | Payer: Medicare Other | Attending: Emergency Medicine | Admitting: Emergency Medicine

## 2020-10-16 ENCOUNTER — Ambulatory Visit (INDEPENDENT_AMBULATORY_CARE_PROVIDER_SITE_OTHER): Payer: Medicare Other

## 2020-10-16 DIAGNOSIS — W19XXXA Unspecified fall, initial encounter: Secondary | ICD-10-CM

## 2020-10-16 DIAGNOSIS — M25512 Pain in left shoulder: Secondary | ICD-10-CM | POA: Diagnosis not present

## 2020-10-16 DIAGNOSIS — Y93A1 Activity, exercise machines primarily for cardiorespiratory conditioning: Secondary | ICD-10-CM | POA: Diagnosis not present

## 2020-10-16 DIAGNOSIS — M79622 Pain in left upper arm: Secondary | ICD-10-CM

## 2020-10-16 DIAGNOSIS — S4992XA Unspecified injury of left shoulder and upper arm, initial encounter: Secondary | ICD-10-CM

## 2020-10-16 NOTE — ED Triage Notes (Signed)
Pt presents with left shoulder and left upper arm pain after falling off her treadmill this morning.

## 2020-10-16 NOTE — Discharge Instructions (Addendum)
You can take Tylenol and/or ibuprofen as needed for pain relief and fever reduction.  Rest as much as possible Ice for 10-15 minutes every 4-6 hours as needed for pain and swelling, you can also use heat or alternate between heat and ice Elevate above your heart when sitting and laying down  You can try lidocaine patches or IcyHot as needed for comfort.   Follow up with sports medicine or orthopedics if symptoms do not improve in the next week.

## 2020-10-16 NOTE — ED Provider Notes (Signed)
[facility]    CSN: [id number] Arrival date & time: 10/16/20  1341      History   Chief Complaint Chief Complaint  Patient presents with   Arm Injury   Shoulder Injury    HPI Erin [name] is a 70 y.o. female.   Patient here for evaluation of left shoulder and upper arm pain after falling on her treadmill earlier today.  Reports pain is constant and achy and worse with movement.  Has not taken any OTC medications or treatments.  Denies any trauma, injury, or other precipitating event.  Denies any specific alleviating or aggravating factors.  Denies any fevers, chest pain, shortness of breath, N/V/D, numbness, tingling, weakness, abdominal pain, or headaches.    The history is provided by the patient.  Arm Injury Shoulder Injury   Past Medical History:  Diagnosis Date   Alcoholism [facility])    Depression    Hyperlipemia     Patient Active Problem List   Diagnosis Date Noted   Alcohol abuse 11/08/2016    Past Surgical History:  Procedure Laterality Date   NOSE SURGERY      OB History   No obstetric history on file.      Home Medications    Prior to Admission medications   Medication Sig Start Date End Date Taking? Authorizing Provider  albuterol (VENTOLIN HFA) 108 (90 Base) MCG/ACT inhaler Inhale 1-2 puffs into the lungs every 6 (six) hours as needed for wheezing or shortness of breath. 10/20/19   Avegno, Zachery Dakins, FNP  atorvastatin (LIPITOR) 20 MG tablet Take 20 mg by mouth every evening. 02/25/20   [provider]  benzonatate (TESSALON) 100 MG capsule Take 1 capsule (100 mg total) by mouth every 8 (eight) hours. Patient not taking: No sig reported 10/20/19   Durward Parcel, FNP  buPROPion (WELLBUTRIN XL) 150 MG 24 hr tablet Take 150 mg by mouth every morning. 03/20/20   [provider]  chlordiazePOXIDE (LIBRIUM) 25 MG capsule 50mg  PO TID x 1D, then 25-50mg  PO BID X 1D, then 25-50mg  PO QD X 1D Patient not taking: Reported on  05/17/2020 08/18/16   Little, 10/19/16, MD  cloNIDine (CATAPRES) 0.1 MG tablet Take 0.1 mg by mouth at bedtime. 03/20/20   [provider]  doxycycline (VIBRAMYCIN) 100 MG capsule Take 1 capsule (100 mg total) by mouth 2 (two) times daily. 05/17/20   07/17/20, MD  hydrOXYzine (ATARAX/VISTARIL) 25 MG tablet Take 25 mg by mouth daily. 03/04/20   [provider]  LATUDA 80 MG TABS tablet Take 40 mg by mouth at bedtime. 01/13/20   [provider]  predniSONE (DELTASONE) 10 MG tablet Take 2 tablets (20 mg total) by mouth daily. Patient not taking: No sig reported 10/20/19   Avegno, 12/20/19, FNP  silver sulfADIAZINE (SILVADENE) 1 % cream Apply 1 application topically 2 (two) times daily. 05/17/20   07/17/20, MD    Family History Family History  Problem Relation Age of Onset   Heart failure Father     Social History Social History   Tobacco Use   Smoking status: Every Day    Packs/day: 1.00    Types: Cigarettes   Smokeless tobacco: Never  Vaping Use   Vaping Use: Never used  Substance Use Topics   Alcohol use: Yes    Comment: Hx alcholism; stopped 1 day ago following a binge   Drug use: No     Allergies   Lamictal [  lamotrigine] and Vistaril [hydroxyzine hcl]   Review of Systems Review of Systems  Musculoskeletal:  Positive for arthralgias and joint swelling.  All other systems reviewed and are negative.   Physical Exam Triage Vital Signs ED Triage Vitals  Enc Vitals Group     BP 10/16/20 1358 134/84     Pulse Rate 10/16/20 1358 77     Resp 10/16/20 1358 17     Temp 10/16/20 1358 98.9 F (37.2 C)     Temp Source 10/16/20 1358 Oral     SpO2 10/16/20 1358 94 %     Weight --      Height --      Head Circumference --      Peak Flow --      Pain Score 10/16/20 1401 7     Pain Loc --      Pain Edu? --      Excl. in GC? --    No data found.  Updated Vital Signs BP 134/84 (BP Location: Right Arm)   Pulse 77   Temp  98.9 F (37.2 C) (Oral)   Resp 17   SpO2 94%   Visual Acuity Right Eye Distance:   Left Eye Distance:   Bilateral Distance:    Right Eye Near:   Left Eye Near:    Bilateral Near:     Physical Exam Vitals and nursing note reviewed.  Constitutional:      General: She is not in acute distress.    Appearance: Normal appearance. She is not ill-appearing, toxic-appearing or diaphoretic.  HENT:     Head: Normocephalic and atraumatic.  Eyes:     Conjunctiva/sclera: Conjunctivae normal.  Cardiovascular:     Rate and Rhythm: Normal rate.     Pulses: Normal pulses.  Pulmonary:     Effort: Pulmonary effort is normal.  Abdominal:     General: Abdomen is flat.  Musculoskeletal:     Left shoulder: No swelling, deformity or tenderness. Decreased range of motion (due to pain). Normal pulse.     Left upper arm: Tenderness and bony tenderness present.     Cervical back: Normal range of motion.  Skin:    General: Skin is warm and dry.  Neurological:     General: No focal deficit present.     Mental Status: She is alert and oriented to person, place, and time.  Psychiatric:        Mood and Affect: Mood normal.     UC Treatments / Results  Labs (all labs ordered are listed, but only abnormal results are displayed) Labs Reviewed - No data to display  EKG   Radiology DG Humerus Left  Result Date: 10/16/2020 CLINICAL DATA:  Pain after fall on treadmill. Shoulder and upper arm pain. EXAM: LEFT HUMERUS - 2+ VIEW COMPARISON:  None. FINDINGS: There is no evidence of fracture or other focal bone lesions. Soft tissues are unremarkable. IMPRESSION: Negative. Electronically Signed   By: Marin Roberts M.D.   On: 10/16/2020 14:44    Procedures Procedures (including critical care time)  Medications Ordered in UC Medications - No data to display  Initial Impression / Assessment and Plan / UC Course  I have reviewed the triage vital signs and the nursing notes.  Pertinent labs &  imaging results that were available during my care of the patient were reviewed by me and considered in my medical decision making (see chart for details).    Assessment negative for red flags or concerns.  X-ray with no acute bony abnormalities.  Left arm injury following fall.  Tylenol and/or ibuprofen as needed for pain.  Recommend rest, ice/heat, and elevation.  May use lidocaine patches or other OTC pain management as needed.  If symptoms do not improve follow-up with orthopedics in the next week. Final Clinical Impressions(s) / UC Diagnoses   Final diagnoses:  Injury of left upper extremity, initial encounter  Fall, initial encounter     Discharge Instructions      You can take Tylenol and/or ibuprofen as needed for pain relief and fever reduction.  Rest as much as possible Ice for 10-15 minutes every 4-6 hours as needed for pain and swelling, you can also use heat or alternate between heat and ice Elevate above your heart when sitting and laying down  You can try lidocaine patches or IcyHot as needed for comfort.   Follow up with sports medicine or orthopedics if symptoms do not improve in the next week.      ED Prescriptions   None    PDMP not reviewed this encounter.   Ivette Loyal, NP 10/16/20 1511

## 2021-12-17 ENCOUNTER — Inpatient Hospital Stay (HOSPITAL_COMMUNITY)
Admission: EM | Admit: 2021-12-17 | Discharge: 2021-12-27 | DRG: 871 | Disposition: A | Discharge status: Home Health | Payer: Medicare Other | Attending: Internal Medicine | Admitting: Internal Medicine

## 2021-12-17 ENCOUNTER — Other Ambulatory Visit: Payer: Self-pay

## 2021-12-17 ENCOUNTER — Encounter (HOSPITAL_COMMUNITY): Payer: Self-pay | Admitting: *Deleted

## 2021-12-17 ENCOUNTER — Emergency Department (HOSPITAL_COMMUNITY): Payer: Medicare Other

## 2021-12-17 DIAGNOSIS — Z20828 Contact with and (suspected) exposure to other viral communicable diseases: Secondary | ICD-10-CM | POA: Diagnosis present

## 2021-12-17 DIAGNOSIS — I10 Essential (primary) hypertension: Secondary | ICD-10-CM | POA: Diagnosis present

## 2021-12-17 DIAGNOSIS — F1010 Alcohol abuse, uncomplicated: Secondary | ICD-10-CM | POA: Diagnosis present

## 2021-12-17 DIAGNOSIS — Z8616 Personal history of COVID-19: Secondary | ICD-10-CM | POA: Diagnosis not present

## 2021-12-17 DIAGNOSIS — F419 Anxiety disorder, unspecified: Secondary | ICD-10-CM | POA: Diagnosis present

## 2021-12-17 DIAGNOSIS — Z79899 Other long term (current) drug therapy: Secondary | ICD-10-CM

## 2021-12-17 DIAGNOSIS — R6520 Severe sepsis without septic shock: Secondary | ICD-10-CM | POA: Diagnosis present

## 2021-12-17 DIAGNOSIS — J188 Other pneumonia, unspecified organism: Secondary | ICD-10-CM | POA: Diagnosis present

## 2021-12-17 DIAGNOSIS — J189 Pneumonia, unspecified organism: Secondary | ICD-10-CM

## 2021-12-17 DIAGNOSIS — Z1152 Encounter for screening for COVID-19: Secondary | ICD-10-CM | POA: Diagnosis not present

## 2021-12-17 DIAGNOSIS — D84821 Immunodeficiency due to drugs: Secondary | ICD-10-CM

## 2021-12-17 DIAGNOSIS — F418 Other specified anxiety disorders: Secondary | ICD-10-CM | POA: Diagnosis present

## 2021-12-17 DIAGNOSIS — Z8249 Family history of ischemic heart disease and other diseases of the circulatory system: Secondary | ICD-10-CM | POA: Diagnosis not present

## 2021-12-17 DIAGNOSIS — I509 Heart failure, unspecified: Secondary | ICD-10-CM | POA: Diagnosis not present

## 2021-12-17 DIAGNOSIS — Z634 Disappearance and death of family member: Secondary | ICD-10-CM

## 2021-12-17 DIAGNOSIS — Z720 Tobacco use: Secondary | ICD-10-CM

## 2021-12-17 DIAGNOSIS — T380X5A Adverse effect of glucocorticoids and synthetic analogues, initial encounter: Secondary | ICD-10-CM | POA: Diagnosis present

## 2021-12-17 DIAGNOSIS — J9601 Acute respiratory failure with hypoxia: Secondary | ICD-10-CM | POA: Diagnosis present

## 2021-12-17 DIAGNOSIS — R9431 Abnormal electrocardiogram [ECG] [EKG]: Secondary | ICD-10-CM | POA: Diagnosis present

## 2021-12-17 DIAGNOSIS — Z7952 Long term (current) use of systemic steroids: Secondary | ICD-10-CM

## 2021-12-17 DIAGNOSIS — F17210 Nicotine dependence, cigarettes, uncomplicated: Secondary | ICD-10-CM | POA: Diagnosis present

## 2021-12-17 DIAGNOSIS — Z888 Allergy status to other drugs, medicaments and biological substances status: Secondary | ICD-10-CM

## 2021-12-17 DIAGNOSIS — K769 Liver disease, unspecified: Secondary | ICD-10-CM | POA: Diagnosis present

## 2021-12-17 DIAGNOSIS — F32A Depression, unspecified: Secondary | ICD-10-CM | POA: Diagnosis present

## 2021-12-17 DIAGNOSIS — E785 Hyperlipidemia, unspecified: Secondary | ICD-10-CM | POA: Diagnosis present

## 2021-12-17 DIAGNOSIS — S27309S Unspecified injury of lung, unspecified, sequela: Secondary | ICD-10-CM | POA: Diagnosis not present

## 2021-12-17 DIAGNOSIS — S27309A Unspecified injury of lung, unspecified, initial encounter: Secondary | ICD-10-CM | POA: Clinically undetermined

## 2021-12-17 DIAGNOSIS — D649 Anemia, unspecified: Secondary | ICD-10-CM | POA: Diagnosis present

## 2021-12-17 DIAGNOSIS — Z0184 Encounter for antibody response examination: Secondary | ICD-10-CM

## 2021-12-17 DIAGNOSIS — I4510 Unspecified right bundle-branch block: Secondary | ICD-10-CM | POA: Diagnosis present

## 2021-12-17 DIAGNOSIS — A419 Sepsis, unspecified organism: Secondary | ICD-10-CM | POA: Diagnosis present

## 2021-12-17 DIAGNOSIS — F1011 Alcohol abuse, in remission: Secondary | ICD-10-CM | POA: Diagnosis present

## 2021-12-17 DIAGNOSIS — Z23 Encounter for immunization: Secondary | ICD-10-CM

## 2021-12-17 DIAGNOSIS — E876 Hypokalemia: Secondary | ICD-10-CM | POA: Diagnosis not present

## 2021-12-17 DIAGNOSIS — S27309D Unspecified injury of lung, unspecified, subsequent encounter: Secondary | ICD-10-CM | POA: Diagnosis not present

## 2021-12-17 LAB — RESP PANEL BY RT-PCR (FLU A&B, COVID) ARPGX2
Influenza A by PCR: NEGATIVE
Influenza B by PCR: NEGATIVE
SARS Coronavirus 2 by RT PCR: NEGATIVE

## 2021-12-17 LAB — TROPONIN I (HIGH SENSITIVITY)
Troponin I (High Sensitivity): 6 ng/L (ref <18)
Troponin I (High Sensitivity): 6 ng/L (ref <18)

## 2021-12-17 LAB — COMPREHENSIVE METABOLIC PANEL
ALT: 36 U/L (ref 0–44)
AST: 37 U/L (ref 15–41)
Albumin: 2.6 g/dL — ABNORMAL LOW (ref 3.5–5.0)
Alkaline Phosphatase: 209 U/L — ABNORMAL HIGH (ref 38–126)
Anion gap: 10 (ref 5–15)
BUN: 12 mg/dL (ref 8–23)
CO2: 26 mmol/L (ref 22–32)
Calcium: 9 mg/dL (ref 8.9–10.3)
Chloride: 100 mmol/L (ref 98–111)
Creatinine, Ser: 0.79 mg/dL (ref 0.44–1.00)
GFR, Estimated: >60 mL/min (ref >60)
Glucose, Bld: 113 mg/dL — ABNORMAL HIGH (ref 70–99)
Potassium: 3.3 mmol/L — ABNORMAL LOW (ref 3.5–5.1)
Sodium: 136 mmol/L (ref 135–145)
Total Bilirubin: 0.9 mg/dL (ref 0.3–1.2)
Total Protein: 6.7 g/dL (ref 6.5–8.1)

## 2021-12-17 LAB — CBC WITH DIFFERENTIAL/PLATELET
Abs Immature Granulocytes: 0.08 10*3/uL — ABNORMAL HIGH (ref 0.00–0.07)
Basophils Absolute: 0 10*3/uL (ref 0.0–0.1)
Basophils Relative: 0 %
Eosinophils Absolute: 0.9 10*3/uL — ABNORMAL HIGH (ref 0.0–0.5)
Eosinophils Relative: 7 %
HCT: 33 % — ABNORMAL LOW (ref 36.0–46.0)
Hemoglobin: 11.2 g/dL — ABNORMAL LOW (ref 12.0–15.0)
Immature Granulocytes: 1 %
Lymphocytes Relative: 13 %
Lymphs Abs: 1.6 10*3/uL (ref 0.7–4.0)
MCH: 29.7 pg (ref 26.0–34.0)
MCHC: 33.9 g/dL (ref 30.0–36.0)
MCV: 87.5 fL (ref 80.0–100.0)
Monocytes Absolute: 0.7 10*3/uL (ref 0.1–1.0)
Monocytes Relative: 6 %
Neutro Abs: 9.5 10*3/uL — ABNORMAL HIGH (ref 1.7–7.7)
Neutrophils Relative %: 73 %
Platelets: 409 10*3/uL — ABNORMAL HIGH (ref 150–400)
RBC: 3.77 MIL/uL — ABNORMAL LOW (ref 3.87–5.11)
RDW: 13 % (ref 11.5–15.5)
WBC: 12.8 10*3/uL — ABNORMAL HIGH (ref 4.0–10.5)
nRBC: 0 % (ref 0.0–0.2)

## 2021-12-17 LAB — BRAIN NATRIURETIC PEPTIDE: B Natriuretic Peptide: 80.3 pg/mL (ref 0.0–100.0)

## 2021-12-17 MED ORDER — LURASIDONE HCL 40 MG PO TABS
40.0000 mg | ORAL_TABLET | Freq: Every day | ORAL | Status: DC
  Administered 2021-12-18 – 2021-12-26 (×9): 40 mg via ORAL
  Filled 2021-12-17 (×12): qty 1

## 2021-12-17 MED ORDER — SODIUM CHLORIDE 0.9 % IV SOLN
2.0000 g | Freq: Once | INTRAVENOUS | Status: AC
  Administered 2021-12-17: 2 g via INTRAVENOUS
  Filled 2021-12-17: qty 20

## 2021-12-17 MED ORDER — CLONIDINE HCL 0.1 MG PO TABS
0.1000 mg | ORAL_TABLET | Freq: Every day | ORAL | Status: DC
  Administered 2021-12-18 – 2021-12-26 (×10): 0.1 mg via ORAL
  Filled 2021-12-17 (×10): qty 1

## 2021-12-17 MED ORDER — SODIUM CHLORIDE 0.9% FLUSH
3.0000 mL | Freq: Two times a day (BID) | INTRAVENOUS | Status: DC
  Administered 2021-12-18 – 2021-12-27 (×20): 3 mL via INTRAVENOUS

## 2021-12-17 MED ORDER — ACETAMINOPHEN 325 MG PO TABS
650.0000 mg | ORAL_TABLET | Freq: Four times a day (QID) | ORAL | Status: DC | PRN
  Administered 2021-12-17 – 2021-12-21 (×4): 650 mg via ORAL
  Filled 2021-12-17 (×3): qty 2

## 2021-12-17 MED ORDER — FLUOXETINE HCL 20 MG PO CAPS
40.0000 mg | ORAL_CAPSULE | Freq: Every day | ORAL | Status: DC
  Administered 2021-12-18 – 2021-12-27 (×10): 40 mg via ORAL
  Filled 2021-12-17 (×10): qty 2

## 2021-12-17 MED ORDER — BUPROPION HCL ER (XL) 150 MG PO TB24
150.0000 mg | ORAL_TABLET | Freq: Every morning | ORAL | Status: DC
  Administered 2021-12-18 – 2021-12-27 (×10): 150 mg via ORAL
  Filled 2021-12-17 (×10): qty 1

## 2021-12-17 MED ORDER — SODIUM CHLORIDE 0.9 % IV SOLN
2.0000 g | INTRAVENOUS | Status: AC
  Administered 2021-12-18 – 2021-12-21 (×4): 2 g via INTRAVENOUS
  Filled 2021-12-17 (×4): qty 20

## 2021-12-17 MED ORDER — ATORVASTATIN CALCIUM 20 MG PO TABS
20.0000 mg | ORAL_TABLET | Freq: Every evening | ORAL | Status: DC
  Administered 2021-12-18 – 2021-12-26 (×10): 20 mg via ORAL
  Filled 2021-12-17: qty 1
  Filled 2021-12-17: qty 2
  Filled 2021-12-17 (×8): qty 1

## 2021-12-17 MED ORDER — OXYCODONE HCL 5 MG PO TABS
5.0000 mg | ORAL_TABLET | ORAL | Status: DC | PRN
  Administered 2021-12-18: 5 mg via ORAL
  Filled 2021-12-17: qty 1

## 2021-12-17 MED ORDER — HYDROXYZINE HCL 25 MG PO TABS
25.0000 mg | ORAL_TABLET | Freq: Every day | ORAL | Status: DC
  Administered 2021-12-18 – 2021-12-27 (×10): 25 mg via ORAL
  Filled 2021-12-17 (×10): qty 1

## 2021-12-17 MED ORDER — AZITHROMYCIN 250 MG PO TABS
500.0000 mg | ORAL_TABLET | Freq: Once | ORAL | Status: AC
  Administered 2021-12-17: 500 mg via ORAL
  Filled 2021-12-17: qty 2

## 2021-12-17 MED ORDER — DOXYCYCLINE HYCLATE 100 MG PO TABS
100.0000 mg | ORAL_TABLET | Freq: Two times a day (BID) | ORAL | Status: DC
  Administered 2021-12-18 – 2021-12-21 (×7): 100 mg via ORAL
  Filled 2021-12-17 (×7): qty 1

## 2021-12-17 MED ORDER — LACTATED RINGERS IV BOLUS (SEPSIS)
500.0000 mL | Freq: Once | INTRAVENOUS | Status: AC
  Administered 2021-12-18: 500 mL via INTRAVENOUS

## 2021-12-17 MED ORDER — ENOXAPARIN SODIUM 40 MG/0.4ML IJ SOSY
40.0000 mg | PREFILLED_SYRINGE | INTRAMUSCULAR | Status: DC
  Administered 2021-12-18 – 2021-12-26 (×10): 40 mg via SUBCUTANEOUS
  Filled 2021-12-17 (×10): qty 0.4

## 2021-12-17 MED ORDER — POTASSIUM CHLORIDE CRYS ER 20 MEQ PO TBCR
40.0000 meq | EXTENDED_RELEASE_TABLET | Freq: Once | ORAL | Status: AC
  Administered 2021-12-18: 40 meq via ORAL
  Filled 2021-12-17: qty 2

## 2021-12-17 MED ORDER — SENNOSIDES-DOCUSATE SODIUM 8.6-50 MG PO TABS
1.0000 | ORAL_TABLET | Freq: Every evening | ORAL | Status: DC | PRN
  Administered 2021-12-22 – 2021-12-23 (×2): 1 via ORAL
  Filled 2021-12-17 (×2): qty 1

## 2021-12-17 MED ORDER — ACETAMINOPHEN 325 MG PO TABS
ORAL_TABLET | ORAL | Status: AC
  Filled 2021-12-17: qty 2

## 2021-12-17 NOTE — H&P (Signed)
History and Physical    Erin [name] NFA:[id number] DOB: 10-29-1950 DOA: 12/17/2021  PCP: Teena Irani, PA-C   Patient coming from: Home   Chief Complaint: SOB, cough   HPI: Erin [name] is a 71 y.o. female with medical history significant for depression, anxiety, and history of alcohol abuse who presents emergency department for evaluation of cough and shortness of breath.  Patient initially sought care at an urgent care for 2 days of progressive cough and shortness of breath, was found to be hypoxic, and directed to the ED.***   ED Course: Upon arrival to the ED, patient is found to be afebrile initially but later becoming febrile, saturating well on 4 L/min of supplemental oxygen, tachypneic, and with stable blood pressure.  EKG features sinus or ectopic atrial rhythm with nonspecific IVCD, repolarization abnormality, and QTc interval 507 ms.  Chest x-ray concerning for multifocal pneumonia.  Labs notable for potassium 3.3, alkaline phosphatase 209, WBC 12,800, platelets 409,000, normal troponin, normal BNP, and negative COVID and influenza PCR.  Patient was given Rocephin and azithromycin in the ED.   Review of Systems:  All other systems reviewed and apart from HPI, are negative.  Past Medical History:  Diagnosis Date   Alcohol abuse 11/08/2016   Alcoholism (HCC)    Depression    Hyperlipemia     Past Surgical History:  Procedure Laterality Date   NOSE SURGERY      Social History:   reports that she has been smoking cigarettes. She has been smoking an average of 1 pack per day. She has never used smokeless tobacco. She reports current alcohol use. She reports that she does not use drugs.  Allergies  Allergen Reactions   Lamictal [Lamotrigine] Swelling    Caused swelling in the face   Vistaril [Hydroxyzine Hcl] Swelling    Caused swelling in the face    Family History  Problem Relation Age of Onset   Heart failure Father      Prior to  Admission medications   Medication Sig Start Date End Date Taking? Authorizing Provider  albuterol (VENTOLIN HFA) 108 (90 Base) MCG/ACT inhaler Inhale 1-2 puffs into the lungs every 6 (six) hours as needed for wheezing or shortness of breath. 10/20/19  Yes Avegno, Zachery Dakins, FNP  atorvastatin (LIPITOR) 20 MG tablet Take 20 mg by mouth every evening. 02/25/20  Yes [provider]  buPROPion (WELLBUTRIN XL) 150 MG 24 hr tablet Take 150 mg by mouth every morning. 03/20/20  Yes [provider]  cloNIDine (CATAPRES) 0.1 MG tablet Take 0.1 mg by mouth at bedtime. 03/20/20  Yes [provider]  FLUoxetine (PROZAC) 40 MG capsule Take 40 mg by mouth daily. 09/15/21  Yes [provider]  hydrOXYzine (ATARAX/VISTARIL) 25 MG tablet Take 25 mg by mouth daily. 03/04/20  Yes [provider]  LATUDA 80 MG TABS tablet Take 40 mg by mouth at bedtime. 01/13/20  Yes [provider]  benzonatate (TESSALON) 100 MG capsule Take 1 capsule (100 mg total) by mouth every 8 (eight) hours. Patient not taking: Reported on 05/17/2020 10/20/19   Durward Parcel, FNP  chlordiazePOXIDE (LIBRIUM) 25 MG capsule 50mg  PO TID x 1D, then 25-50mg  PO BID X 1D, then 25-50mg  PO QD X 1D Patient not taking: Reported on 05/17/2020 08/18/16   Little, 10/19/16, MD  silver sulfADIAZINE (SILVADENE) 1 % cream Apply 1 application topically 2 (two) times daily. Patient not taking: Reported on 12/17/2021 05/17/20   07/17/20  J, MD    Physical Exam: Vitals:   12/17/21 2115 12/17/21 2200 12/17/21 2230 12/17/21 2237  BP: 128/73 136/66 134/66   Pulse: 80 88 87   Resp: (!) 33     Temp:    (!) 101.2 F (38.4 C)  TempSrc:    Oral  SpO2: 93% 93% 92%   Weight:         Constitutional: NAD, calm  Eyes: PERTLA, lids and conjunctivae normal ENMT: Mucous membranes are moist. Posterior pharynx clear of any exudate or lesions.   Neck: supple, no masses  Respiratory: clear to auscultation  bilaterally, no wheezing, no crackles. No accessory muscle use.  Cardiovascular: S1 & S2 heard, regular rate and rhythm. No extremity edema. No significant JVD. Abdomen: No distension, no tenderness, soft. Bowel sounds active.  Musculoskeletal: no clubbing / cyanosis. No joint deformity upper and lower extremities.   Skin: no significant rashes, lesions, ulcers. Warm, dry, well-perfused. Neurologic: CN 2-12 grossly intact. Sensation intact, DTR normal. Strength 5/5 in all 4 limbs. Alert and oriented.  Psychiatric: Pleasant. Cooperative.    Labs and Imaging on Admission: I have personally reviewed following labs and imaging studies  CBC: Recent Labs  Lab 12/17/21 1850  WBC 12.8*  NEUTROABS 9.5*  HGB 11.2*  HCT 33.0*  MCV 87.5  PLT 409*   Basic Metabolic Panel: Recent Labs  Lab 12/17/21 1850  NA 136  K 3.3*  CL 100  CO2 26  GLUCOSE 113*  BUN 12  CREATININE 0.79  CALCIUM 9.0   GFR: CrCl cannot be calculated (Unknown ideal weight.). Liver Function Tests: Recent Labs  Lab 12/17/21 1850  AST 37  ALT 36  ALKPHOS 209*  BILITOT 0.9  PROT 6.7  ALBUMIN 2.6*   No results for input(s): "LIPASE", "AMYLASE" in the last 168 hours. No results for input(s): "AMMONIA" in the last 168 hours. Coagulation Profile: No results for input(s): "INR", "PROTIME" in the last 168 hours. Cardiac Enzymes: No results for input(s): "CKTOTAL", "CKMB", "CKMBINDEX", "TROPONINI" in the last 168 hours. BNP (last 3 results) No results for input(s): "PROBNP" in the last 8760 hours. HbA1C: No results for input(s): "HGBA1C" in the last 72 hours. CBG: No results for input(s): "GLUCAP" in the last 168 hours. Lipid Profile: No results for input(s): "CHOL", "HDL", "LDLCALC", "TRIG", "CHOLHDL", "LDLDIRECT" in the last 72 hours. Thyroid Function Tests: No results for input(s): "TSH", "T4TOTAL", "FREET4", "T3FREE", "THYROIDAB" in the last 72 hours. Anemia Panel: No results for input(s): "VITAMINB12",  "FOLATE", "FERRITIN", "TIBC", "IRON", "RETICCTPCT" in the last 72 hours. Urine analysis:    Component Value Date/Time   COLORURINE YELLOW 09/06/2009 1350   APPEARANCEUR CLOUDY (A) 09/06/2009 1350   LABSPEC 1.030 09/06/2009 1350   PHURINE 5.0 09/06/2009 1350   GLUCOSEU NEGATIVE 09/06/2009 1350   HGBUR NEGATIVE 09/06/2009 1350   BILIRUBINUR NEGATIVE 09/06/2009 1350   KETONESUR NEGATIVE 09/06/2009 1350   PROTEINUR NEGATIVE 09/06/2009 1350   UROBILINOGEN 0.2 09/06/2009 1350   NITRITE NEGATIVE 09/06/2009 1350   LEUKOCYTESUR  09/06/2009 1350    NEGATIVE MICROSCOPIC NOT DONE ON URINES WITH NEGATIVE PROTEIN, BLOOD, LEUKOCYTES, NITRITE, OR GLUCOSE <1000 mg/dL.   Sepsis Labs: @LABRCNTIP (procalcitonin:4,lacticidven:4) ) Recent Results (from the past 240 hour(s))  Resp Panel by RT-PCR (Flu A&B, Covid) Anterior Nasal Swab     Status: None   Collection Time: 12/17/21  6:24 PM   Specimen: Anterior Nasal Swab  Result Value Ref Range Status   SARS Coronavirus 2 by RT PCR NEGATIVE NEGATIVE Final  Comment: (NOTE) SARS-CoV-2 target nucleic acids are NOT DETECTED.  The SARS-CoV-2 RNA is generally detectable in upper respiratory specimens during the acute phase of infection. The lowest concentration of SARS-CoV-2 viral copies this assay can detect is 138 copies/mL. A negative result does not preclude SARS-Cov-2 infection and should not be used as the sole basis for treatment or other patient management decisions. A negative result may occur with  improper specimen collection/handling, submission of specimen other than nasopharyngeal swab, presence of viral mutation(s) within the areas targeted by this assay, and inadequate number of viral copies(<138 copies/mL). A negative result must be combined with clinical observations, patient history, and epidemiological information. The expected result is Negative.  Fact Sheet for Patients:  EntrepreneurPulse.com.au  Fact Sheet for  Healthcare Providers:  IncredibleEmployment.be  This test is no t yet approved or cleared by the [place] FDA and  has been authorized for detection and/or diagnosis of SARS-CoV-2 by FDA under an Emergency Use Authorization (EUA). This EUA will remain  in effect (meaning this test can be used) for the duration of the COVID-19 declaration under Section 564(b)(1) of the Act, 21 U.S.C.section 360bbb-3(b)(1), unless the authorization is terminated  or revoked sooner.       Influenza A by PCR NEGATIVE NEGATIVE Final   Influenza B by PCR NEGATIVE NEGATIVE Final    Comment: (NOTE) The Xpert Xpress SARS-CoV-2/FLU/RSV plus assay is intended as an aid in the diagnosis of influenza from Nasopharyngeal swab specimens and should not be used as a sole basis for treatment. Nasal washings and aspirates are unacceptable for Xpert Xpress SARS-CoV-2/FLU/RSV testing.  Fact Sheet for Patients: EntrepreneurPulse.com.au  Fact Sheet for Healthcare Providers: IncredibleEmployment.be  This test is not yet approved or cleared by the [place] FDA and has been authorized for detection and/or diagnosis of SARS-CoV-2 by FDA under an Emergency Use Authorization (EUA). This EUA will remain in effect (meaning this test can be used) for the duration of the COVID-19 declaration under Section 564(b)(1) of the Act, 21 U.S.C. section 360bbb-3(b)(1), unless the authorization is terminated or revoked.  Performed at [facility], [place] [address] [place], [place] [address]      Radiological Exams on Admission: DG Chest 2 View  Result Date: 12/17/2021 CLINICAL DATA:  Shortness of breath and cough. EXAM: CHEST - 2 VIEW COMPARISON:  None Available. FINDINGS: The heart is normal in size. There is aortic tortuosity and atherosclerosis. Suspected retrocardiac hiatal hernia. Heterogeneous bilateral airspace disease most prominent in  bilateral infrahilar regions. No significant pleural effusion. No pneumothorax. Limited assessment, no acute osseous findings. IMPRESSION: 1. Heterogeneous bilateral airspace disease most prominent in bilateral infrahilar regions, suspicious for multifocal pneumonia. 2. Suspected retrocardiac hiatal hernia. Electronically Signed   By: Keith Rake M.D.   On: 12/17/2021 17:20    EKG: Independently reviewed. Sinus or ectopic atrial rhythm, non-specific IVCD, repolarization abnormality, QTc 507.   Assessment/Plan   1. Severe sepsis d/t pneumonia; acute hypoxic respiratory failure  - Found to have multifocal PNA with fever, leukocytosis, tachypnea, and acute hypoxic respiratory failure   - Started on Rocephin and azithromycin in ED  - Culture blood and sputum, check strep pneumo and legionella antigens, change azithro to doxy (prolonged QT), continue supplemental O2 as needed, trend procalcitonin, and follow cultures and clinical course   2. HTN  - Continue clonidine    3. Depression, anxiety  - Continue Wellbutrin, Prozac, Latuda, and hydroxyzine     4. Prolonged QT interval  - QTc  is 507 ms in ED  - Replace potassium, check magnesium, avoid QT-prolonging medications    DVT prophylaxis: Lovenox  Code Status: Full  Level of Care: Level of care: Telemetry Family Communication: ***  Disposition Plan:  Patient is from: home  Anticipated d/c is to: Home  Anticipated d/c date is: 12/20/21  Patient currently: pending improved respiratory status and transition to oral medications  Consults called: none  Admission status: Inpatient     Briscoe Deutscher, MD [facility]  12/17/2021, 10:49 PM

## 2021-12-17 NOTE — ED Triage Notes (Signed)
Here from home for SOB, and dry cough. Concerned for PNA. Can't take a deep breath in. Denies fever, productive cough, congestion, NVD, or pain.

## 2021-12-17 NOTE — ED Provider Notes (Signed)
[facility]-EMERGENCY DEPT Provider Note   CSN: [id number] Arrival date & time: 12/17/21  1546     History  Chief Complaint  Patient presents with   Shortness of Breath    Erin [name] is a 71 y.o. female.   Shortness of Breath    Patient has a history of hyperlipidemia, depression, alcohol use disorder, tobacco use.  Patient presents to the ED with complaints of shortness of breath and cough.  Patient states she noticed the symptoms over the last couple days.  She has been coughing a lot.  She has been feeling more short of breath especially with any activity.  She denies any chest pain.  She denies any leg swelling.  Patient states she went to an urgent care today and they noted that her oxygen level was low so she was sent to the ED for further evaluation  Home Medications Prior to Admission medications   Medication Sig Start Date End Date Taking? Authorizing Provider  albuterol (VENTOLIN HFA) 108 (90 Base) MCG/ACT inhaler Inhale 1-2 puffs into the lungs every 6 (six) hours as needed for wheezing or shortness of breath. 10/20/19   Avegno, Zachery Dakins, FNP  atorvastatin (LIPITOR) 20 MG tablet Take 20 mg by mouth every evening. 02/25/20   [provider]  benzonatate (TESSALON) 100 MG capsule Take 1 capsule (100 mg total) by mouth every 8 (eight) hours. Patient not taking: No sig reported 10/20/19   Durward Parcel, FNP  buPROPion (WELLBUTRIN XL) 150 MG 24 hr tablet Take 150 mg by mouth every morning. 03/20/20   [provider]  chlordiazePOXIDE (LIBRIUM) 25 MG capsule 50mg  PO TID x 1D, then 25-50mg  PO BID X 1D, then 25-50mg  PO QD X 1D Patient not taking: Reported on 05/17/2020 08/18/16   Little, 10/19/16, MD  cloNIDine (CATAPRES) 0.1 MG tablet Take 0.1 mg by mouth at bedtime. 03/20/20   [provider]  doxycycline (VIBRAMYCIN) 100 MG capsule Take 1 capsule (100 mg total) by mouth 2 (two) times daily. 05/17/20   07/17/20, MD  hydrOXYzine (ATARAX/VISTARIL) 25 MG tablet Take 25 mg by mouth daily. 03/04/20   [provider]  LATUDA 80 MG TABS tablet Take 40 mg by mouth at bedtime. 01/13/20   [provider]  predniSONE (DELTASONE) 10 MG tablet Take 2 tablets (20 mg total) by mouth daily. Patient not taking: No sig reported 10/20/19   Avegno, 12/20/19, FNP  silver sulfADIAZINE (SILVADENE) 1 % cream Apply 1 application topically 2 (two) times daily. 05/17/20   07/17/20, MD      Allergies    Lamictal [lamotrigine] and Vistaril [hydroxyzine hcl]    Review of Systems   Review of Systems  Respiratory:  Positive for shortness of breath.     Physical Exam Updated Vital Signs BP 134/79   Pulse 83   Temp 99.8 F (37.7 C) (Oral)   Resp (!) 32   Wt 91.6 kg   SpO2 94%   BMI 29.83 kg/m  Physical Exam Vitals and nursing note reviewed.  Constitutional:      General: She is not in acute distress.    Appearance: She is well-developed.  HENT:     Head: Normocephalic and atraumatic.     Right Ear: External ear normal.     Left Ear: External ear normal.  Eyes:     General: No scleral icterus.       Right eye: No discharge.  Left eye: No discharge.     Conjunctiva/sclera: Conjunctivae normal.  Neck:     Trachea: No tracheal deviation.  Cardiovascular:     Rate and Rhythm: Normal rate and regular rhythm.  Pulmonary:     Effort: Pulmonary effort is normal. No respiratory distress.     Breath sounds: No stridor. Rales present. No wheezing.  Abdominal:     General: Bowel sounds are normal. There is no distension.     Palpations: Abdomen is soft.     Tenderness: There is no abdominal tenderness. There is no guarding or rebound.  Musculoskeletal:        General: No tenderness or deformity.     Cervical back: Neck supple.  Skin:    General: Skin is warm and dry.     Findings: No rash.  Neurological:     General: No focal deficit present.     Mental Status: She is  alert.     Cranial Nerves: No cranial nerve deficit (no facial droop, extraocular movements intact, no slurred speech).     Sensory: No sensory deficit.     Motor: No abnormal muscle tone or seizure activity.     Coordination: Coordination normal.  Psychiatric:        Mood and Affect: Mood normal.     ED Results / Procedures / Treatments   Labs (all labs ordered are listed, but only abnormal results are displayed) Labs Reviewed  COMPREHENSIVE METABOLIC PANEL - Abnormal; Notable for the following components:      Result Value   Potassium 3.3 (*)    Glucose, Bld 113 (*)    Albumin 2.6 (*)    Alkaline Phosphatase 209 (*)    All other components within normal limits  CBC WITH DIFFERENTIAL/PLATELET - Abnormal; Notable for the following components:   WBC 12.8 (*)    RBC 3.77 (*)    Hemoglobin 11.2 (*)    HCT 33.0 (*)    Platelets 409 (*)    Neutro Abs 9.5 (*)    Eosinophils Absolute 0.9 (*)    Abs Immature Granulocytes 0.08 (*)    All other components within normal limits  RESP PANEL BY RT-PCR (FLU A&B, COVID) [id number]  BRAIN NATRIURETIC PEPTIDE  TROPONIN I (HIGH SENSITIVITY)  TROPONIN I (HIGH SENSITIVITY)    EKG EKG Interpretation  Date/Time:  Saturday December 17 2021 16:56:39 EDT Ventricular Rate:  85 PR Interval:  126 QRS Duration: 135 QT Interval:  426 QTC Calculation: 507 R Axis:   96 Text Interpretation: Sinus or ectopic atrial rhythm IVCD, consider atypical RBBB Repol abnrm suggests ischemia, lateral leads Baseline wander in lead(s) II aVF repol changes new since last tracing Confirmed by Dorie Rank [phone]) on 12/17/2021 6:27:47 PM  Radiology DG Chest 2 View  Result Date: 12/17/2021 CLINICAL DATA:  Shortness of breath and cough. EXAM: CHEST - 2 VIEW COMPARISON:  None Available. FINDINGS: The heart is normal in size. There is aortic tortuosity and atherosclerosis. Suspected retrocardiac hiatal hernia. Heterogeneous bilateral airspace disease most prominent in  bilateral infrahilar regions. No significant pleural effusion. No pneumothorax. Limited assessment, no acute osseous findings. IMPRESSION: 1. Heterogeneous bilateral airspace disease most prominent in bilateral infrahilar regions, suspicious for multifocal pneumonia. 2. Suspected retrocardiac hiatal hernia. Electronically Signed   By: Keith Rake M.D.   On: 12/17/2021 17:20    Procedures .Critical Care  Performed by: Dorie Rank, MD Authorized by: Dorie Rank, MD   Critical care provider statement:    Critical care time (  minutes):  30   Critical care was time spent personally by me on the following activities:  Development of treatment plan with patient or surrogate, discussions with consultants, evaluation of patient's response to treatment, examination of patient, ordering and review of laboratory studies, ordering and review of radiographic studies, ordering and performing treatments and interventions, pulse oximetry, re-evaluation of patient's condition and review of old charts     Medications Ordered in ED Medications  cefTRIAXone (ROCEPHIN) 2 g in sodium chloride 0.9 % 100 mL IVPB (0 g Intravenous Stopped 12/17/21 2026)  azithromycin (ZITHROMAX) tablet 500 mg (500 mg Oral Given 12/17/21 1955)    ED Course/ Medical Decision Making/ A&P Clinical Course as of 12/17/21 2123  Sat Dec 17, 2021  1826 DG Chest 2 View Chest x-ray findings concerning for multifocal pneumonia [JK]  2108 CBC with Differential(!) Leukocytosis noted [JK]  2109 Comprehensive metabolic panel(!) No significant metabolic panel [JK]  2109 Resp Panel by RT-PCR (Flu A&B, Covid) Anterior Nasal Swab Negative [JK]  2109 Troponin I (High Sensitivity) Troponin normal [JK]  2109 Brain natriuretic peptide BNP normal [JK]  2121 Case discussed with Dr. Antionette Char regarding admission [JK]    Clinical Course User Index [JK] Linwood Dibbles, MD                           Medical Decision Making Problems Addressed: Community  acquired pneumonia, unspecified laterality: acute illness or injury that poses a threat to life or bodily functions  Amount and/or Complexity of Data Reviewed Labs:  Decision-making details documented in ED Course. Radiology:  Decision-making details documented in ED Course.  Risk Prescription drug management. Decision regarding hospitalization.   Patient presents to the ED for evaluation of cough, new oxygen requirement.  Presentation most concerning for possible pneumonia but CHF , thorax also consideration.  ED work-up most consistent with pneumonia.  Troponin and BNP are normal.  No findings suggest ACS or CHF exacerbation.  COVID-negative.  Patient has been started on IV antibiotics.  She has remained hemodynamically stable although does have an oxygen requirement.  Plan admission to the hospital for further treatment and evaluation.       Final Clinical Impression(s) / ED Diagnoses Final diagnoses:  Community acquired pneumonia, unspecified laterality    Rx / DC Orders ED Discharge Orders     None         Linwood Dibbles, MD 12/17/21 2123

## 2021-12-17 NOTE — ED Notes (Signed)
O2 increased to 4L 

## 2021-12-17 NOTE — ED Provider Triage Note (Signed)
Emergency Medicine Provider Triage Evaluation Note  Erin [name] , a 71 y.o. female  was evaluated in triage.  Pt complains of shortness of breath.  Patient states that she has noticed shortness of breath and cough worsening over the past 2 days.  She was seen in urgent care earlier today and told to come the emergency department.  She was found to have 78% O2 while ambulating as well as chest x-ray findings concerning for rib bilateral pneumonia versus pulmonary edema.  Denies history of cardiac or pulmonary issues.  Denies fever, chills, abdominal pain, nausea, vomiting, urinary symptoms, change in bowel habits  Review of Systems  Positive: See above Negative:   Physical Exam  BP (!) 143/80 (BP Location: Left Arm)   Pulse 86   Temp 98.9 F (37.2 C) (Oral)   Resp 20   Wt 91.6 kg   SpO2 91%   BMI 29.83 kg/m  Gen:   Awake, no distress   Resp:  Normal effort  MSK:   Moves extremities without difficulty  Other:    Medical Decision Making  Medically screening exam initiated at 5:09 PM.  Appropriate orders placed.  Jamal Collin was informed that the remainder of the evaluation will be completed by another provider, this initial triage assessment does not replace that evaluation, and the importance of remaining in the ED until their evaluation is complete.     Wilnette Kales, [place] 12/17/21 1717

## 2021-12-17 NOTE — ED Notes (Signed)
Back from xray, resting comfortably in chair, NAD, calm, interactive, watching phone. VSS.

## 2021-12-18 DIAGNOSIS — J189 Pneumonia, unspecified organism: Secondary | ICD-10-CM | POA: Diagnosis present

## 2021-12-18 DIAGNOSIS — A419 Sepsis, unspecified organism: Secondary | ICD-10-CM | POA: Diagnosis not present

## 2021-12-18 LAB — HEPATIC FUNCTION PANEL
ALT: 33 U/L (ref 0–44)
AST: 37 U/L (ref 15–41)
Albumin: 2.3 g/dL — ABNORMAL LOW (ref 3.5–5.0)
Alkaline Phosphatase: 202 U/L — ABNORMAL HIGH (ref 38–126)
Bilirubin, Direct: 0.4 mg/dL — ABNORMAL HIGH (ref 0.0–0.2)
Indirect Bilirubin: 0.4 mg/dL (ref 0.3–0.9)
Total Bilirubin: 0.8 mg/dL (ref 0.3–1.2)
Total Protein: 6.1 g/dL — ABNORMAL LOW (ref 6.5–8.1)

## 2021-12-18 LAB — BASIC METABOLIC PANEL
Anion gap: 7 (ref 5–15)
BUN: 11 mg/dL (ref 8–23)
CO2: 25 mmol/L (ref 22–32)
Calcium: 8.6 mg/dL — ABNORMAL LOW (ref 8.9–10.3)
Chloride: 100 mmol/L (ref 98–111)
Creatinine, Ser: 0.58 mg/dL (ref 0.44–1.00)
GFR, Estimated: >60 mL/min (ref >60)
Glucose, Bld: 106 mg/dL — ABNORMAL HIGH (ref 70–99)
Potassium: 3.8 mmol/L (ref 3.5–5.1)
Sodium: 132 mmol/L — ABNORMAL LOW (ref 135–145)

## 2021-12-18 LAB — MAGNESIUM: Magnesium: 1.9 mg/dL (ref 1.7–2.4)

## 2021-12-18 LAB — APTT: aPTT: 35 seconds (ref 24–36)

## 2021-12-18 LAB — CBC
HCT: 30.8 % — ABNORMAL LOW (ref 36.0–46.0)
Hemoglobin: 10.3 g/dL — ABNORMAL LOW (ref 12.0–15.0)
MCH: 29.4 pg (ref 26.0–34.0)
MCHC: 33.4 g/dL (ref 30.0–36.0)
MCV: 88 fL (ref 80.0–100.0)
Platelets: 392 10*3/uL (ref 150–400)
RBC: 3.5 MIL/uL — ABNORMAL LOW (ref 3.87–5.11)
RDW: 13.4 % (ref 11.5–15.5)
WBC: 11 10*3/uL — ABNORMAL HIGH (ref 4.0–10.5)
nRBC: 0 % (ref 0.0–0.2)

## 2021-12-18 LAB — MRSA NEXT GEN BY PCR, NASAL: MRSA by PCR Next Gen: NOT DETECTED

## 2021-12-18 LAB — PROTIME-INR
INR: 1.2 (ref 0.8–1.2)
Prothrombin Time: 14.6 seconds (ref 11.4–15.2)

## 2021-12-18 LAB — PROCALCITONIN
Procalcitonin: 0.18 ng/mL
Procalcitonin: 0.13 ng/mL

## 2021-12-18 LAB — LACTIC ACID, PLASMA: Lactic Acid, Venous: 1.3 mmol/L (ref 0.5–1.9)

## 2021-12-18 LAB — HIV ANTIBODY (ROUTINE TESTING W REFLEX): HIV Screen 4th Generation wRfx: NONREACTIVE

## 2021-12-18 MED ORDER — ORAL CARE MOUTH RINSE: 15.0000 mL | OROMUCOSAL | Status: DC | PRN

## 2021-12-18 MED ORDER — CHLORHEXIDINE GLUCONATE CLOTH 2 % EX PADS
6.0000 | MEDICATED_PAD | Freq: Every day | CUTANEOUS | Status: DC
  Administered 2021-12-18 – 2021-12-24 (×7): 6 via TOPICAL

## 2021-12-18 MED ORDER — NICOTINE 21 MG/24HR TD PT24
21.0000 mg | MEDICATED_PATCH | Freq: Every day | TRANSDERMAL | Status: DC
  Administered 2021-12-18 – 2021-12-27 (×10): 21 mg via TRANSDERMAL
  Filled 2021-12-18 (×11): qty 1

## 2021-12-18 NOTE — Hospital Course (Signed)
71 y.o. female with medical history significant for depression, anxiety, and history of alcohol abuse in remission who presents emergency department for evaluation of cough and shortness of breath.   Patient initially sought care at an urgent care for 2 days of progressive cough and shortness of breath, was found to be hypoxic, and directed to the ED. In ED, pt was found to have xray findings worrisome for multifocal PNA with leukocytosis. Pt was started on abx

## 2021-12-18 NOTE — Progress Notes (Signed)
RT was called into room. No resp distress noted at this time. Pt is maintaining O2 saturation at 92-93 % on 6L [place]. Pt said she feels ok. RN in the room.

## 2021-12-18 NOTE — ED Notes (Signed)
Pt sats at 100% on non rebreather, O2 [place] placed at 5 liters

## 2021-12-18 NOTE — Progress Notes (Signed)
  Progress Note   Patient: Erin [name] DDU:[id number] DOB: [birth year] DOA: 12/17/2021     1 DOS: the patient was seen and examined on 12/18/2021   Brief hospital course: 71 y.o. female with medical history significant for depression, anxiety, and history of alcohol abuse in remission who presents emergency department for evaluation of cough and shortness of breath.   Patient initially sought care at an urgent care for 2 days of progressive cough and shortness of breath, was found to be hypoxic, and directed to the ED. In ED, pt was found to have xray findings worrisome for multifocal PNA with leukocytosis. Pt was started on abx  Assessment and Plan: 1. Severe sepsis d/t pneumonia; acute hypoxic respiratory failure, present on admission  - Found to have multifocal PNA with fever, leukocytosis, tachypnea, and acute hypoxic respiratory failure   - Started on Rocephin and azithromycin in ED  - Overnight documentation noted. Pt reportedly removed her own O2 to ambulate to bathroom, later found markedly hypoxemic upon return. Currently still needing 15L high flow O2   2. HTN  -BP stable at present - Continue clonidine     3. Depression, anxiety  - Continue Wellbutrin, Prozac, Latuda, and hydroxyzine      4. Prolonged QT interval  - QTc is 507 ms in ED  -potassium corrected, avoid QT-prolonging medications       Subjective: States breathing feels better since O2 was re-applied  Physical Exam: Vitals:   12/18/21 0945 12/18/21 1029 12/18/21 1130 12/18/21 1230  BP:  110/69 115/60 118/69  Pulse: 82 81 75 78  Resp:  16 19   Temp:  99.3 F (37.4 C)    TempSrc:  Oral    SpO2: 93% 95% 93% 95%  Weight:       General exam: Awake, laying in bed, in nad Respiratory system: Slightly increased respiratory effort, no wheezing Cardiovascular system: regular rate, s1, s2 Gastrointestinal system: Soft, nondistended, positive BS Central nervous system: CN2-12 grossly intact, strength  intact Extremities: Perfused, no clubbing Skin: Normal skin turgor, no notable skin lesions seen Psychiatry: Mood normal // no visual hallucinations   Data Reviewed:  Labs reviewed: Na 132, K 3.8, Cr 0.58, WBC 11.0, hgb 10.3  Family Communication: Pt in room, family not at bedside  Disposition: Status is: Inpatient Remains inpatient appropriate because: Severity of illness  Planned Discharge Destination: Home    Author: Marylu Lund, MD 12/18/2021 2:05 PM  For on call review [email or web address].

## 2021-12-18 NOTE — ED Notes (Signed)
Pt at 88% on 4 L with Labored breathing.  Increased O2 to 6L

## 2021-12-18 NOTE — ED Notes (Signed)
Pt sats noted at 77% on 5 liters, placed on 10% O2 [place]

## 2021-12-19 ENCOUNTER — Inpatient Hospital Stay (HOSPITAL_COMMUNITY): Payer: Medicare Other

## 2021-12-19 DIAGNOSIS — J9601 Acute respiratory failure with hypoxia: Secondary | ICD-10-CM | POA: Diagnosis not present

## 2021-12-19 DIAGNOSIS — J189 Pneumonia, unspecified organism: Secondary | ICD-10-CM | POA: Diagnosis not present

## 2021-12-19 DIAGNOSIS — A419 Sepsis, unspecified organism: Secondary | ICD-10-CM | POA: Diagnosis not present

## 2021-12-19 LAB — RESPIRATORY PANEL BY PCR

## 2021-12-19 LAB — BASIC METABOLIC PANEL
Anion gap: 8 (ref 5–15)
BUN: 10 mg/dL (ref 8–23)
CO2: 24 mmol/L (ref 22–32)
Calcium: 8.7 mg/dL — ABNORMAL LOW (ref 8.9–10.3)
Chloride: 100 mmol/L (ref 98–111)
Creatinine, Ser: 0.57 mg/dL (ref 0.44–1.00)
GFR, Estimated: >60 mL/min (ref >60)
Glucose, Bld: 108 mg/dL — ABNORMAL HIGH (ref 70–99)
Potassium: 4 mmol/L (ref 3.5–5.1)
Sodium: 132 mmol/L — ABNORMAL LOW (ref 135–145)

## 2021-12-19 LAB — CBC WITH DIFFERENTIAL/PLATELET
Abs Immature Granulocytes: 0.07 10*3/uL (ref 0.00–0.07)
Basophils Absolute: 0.1 10*3/uL (ref 0.0–0.1)
Basophils Relative: 0 %
Eosinophils Absolute: 1.2 10*3/uL — ABNORMAL HIGH (ref 0.0–0.5)
Eosinophils Relative: 9 %
HCT: 34 % — ABNORMAL LOW (ref 36.0–46.0)
Hemoglobin: 11.1 g/dL — ABNORMAL LOW (ref 12.0–15.0)
Immature Granulocytes: 1 %
Lymphocytes Relative: 10 %
Lymphs Abs: 1.3 10*3/uL (ref 0.7–4.0)
MCH: 28.9 pg (ref 26.0–34.0)
MCHC: 32.6 g/dL (ref 30.0–36.0)
MCV: 88.5 fL (ref 80.0–100.0)
Monocytes Absolute: 0.7 10*3/uL (ref 0.1–1.0)
Monocytes Relative: 5 %
Neutro Abs: 10.3 10*3/uL — ABNORMAL HIGH (ref 1.7–7.7)
Neutrophils Relative %: 75 %
Platelets: 429 10*3/uL — ABNORMAL HIGH (ref 150–400)
RBC: 3.84 MIL/uL — ABNORMAL LOW (ref 3.87–5.11)
RDW: 13.9 % (ref 11.5–15.5)
WBC: 13.6 10*3/uL — ABNORMAL HIGH (ref 4.0–10.5)
nRBC: 0 % (ref 0.0–0.2)

## 2021-12-19 LAB — BLOOD GAS, ARTERIAL
Acid-Base Excess: 5.7 mmol/L — ABNORMAL HIGH (ref 0.0–2.0)
Bicarbonate: 28.6 mmol/L — ABNORMAL HIGH (ref 20.0–28.0)
Drawn by: 331471
O2 Saturation: 95.8 %
Patient temperature: 37
pCO2 arterial: 35 mmHg (ref 32–48)
pH, Arterial: 7.52 — ABNORMAL HIGH (ref 7.35–7.45)
pO2, Arterial: 67 mmHg — ABNORMAL LOW (ref 83–108)

## 2021-12-19 LAB — CK TOTAL AND CKMB (NOT AT ARMC)
CK, MB: 6.5 ng/mL — ABNORMAL HIGH (ref 0.5–5.0)
Relative Index: INVALID (ref 0.0–2.5)
Total CK: 16 U/L — ABNORMAL LOW (ref 38–234)

## 2021-12-19 LAB — CBC
HCT: 31.7 % — ABNORMAL LOW (ref 36.0–46.0)
Hemoglobin: 10.6 g/dL — ABNORMAL LOW (ref 12.0–15.0)
MCH: 29.1 pg (ref 26.0–34.0)
MCHC: 33.4 g/dL (ref 30.0–36.0)
MCV: 87.1 fL (ref 80.0–100.0)
Platelets: 399 10*3/uL (ref 150–400)
RBC: 3.64 MIL/uL — ABNORMAL LOW (ref 3.87–5.11)
RDW: 13.6 % (ref 11.5–15.5)
WBC: 11.9 10*3/uL — ABNORMAL HIGH (ref 4.0–10.5)
nRBC: 0 % (ref 0.0–0.2)

## 2021-12-19 LAB — BRAIN NATRIURETIC PEPTIDE: B Natriuretic Peptide: 107 pg/mL — ABNORMAL HIGH (ref 0.0–100.0)

## 2021-12-19 LAB — SEDIMENTATION RATE: Sed Rate: 111 mm/hr — ABNORMAL HIGH (ref 0–22)

## 2021-12-19 LAB — LACTATE DEHYDROGENASE: LDH: 450 U/L — ABNORMAL HIGH (ref 98–192)

## 2021-12-19 LAB — TROPONIN I (HIGH SENSITIVITY): Troponin I (High Sensitivity): 11 ng/L (ref <18)

## 2021-12-19 LAB — C-REACTIVE PROTEIN: CRP: 25.2 mg/dL — ABNORMAL HIGH (ref <1.0)

## 2021-12-19 LAB — PROCALCITONIN: Procalcitonin: 0.13 ng/mL

## 2021-12-19 MED ORDER — FUROSEMIDE 10 MG/ML IJ SOLN: 20.0000 mg | Freq: Once | INTRAMUSCULAR | Status: DC

## 2021-12-19 MED ORDER — POTASSIUM CHLORIDE CRYS ER 20 MEQ PO TBCR
20.0000 meq | EXTENDED_RELEASE_TABLET | Freq: Once | ORAL | Status: AC
  Administered 2021-12-19: 20 meq via ORAL
  Filled 2021-12-19: qty 1

## 2021-12-19 MED ORDER — SODIUM CHLORIDE (PF) 0.9 % IJ SOLN
INTRAMUSCULAR | Status: AC
  Filled 2021-12-19: qty 50

## 2021-12-19 MED ORDER — IOHEXOL 350 MG/ML SOLN
75.0000 mL | Freq: Once | INTRAVENOUS | Status: AC | PRN
  Administered 2021-12-19: 75 mL via INTRAVENOUS

## 2021-12-19 MED ORDER — FUROSEMIDE 10 MG/ML IJ SOLN
40.0000 mg | Freq: Once | INTRAMUSCULAR | Status: AC
  Administered 2021-12-19: 40 mg via INTRAVENOUS
  Filled 2021-12-19: qty 4

## 2021-12-19 NOTE — TOC Initial Note (Signed)
Transition of Care [facility]) - Initial/Assessment Note    Patient Details  Name: Erin [name] MRN: [id number] Date of Birth: 1950/05/29  Transition of Care [facility]) CM/SW Contact:    Leeroy Cha, RN Phone Number: 12/19/2021, 9:31 AM  Clinical Narrative:                  Transition of Care [facility]) Screening Note   Patient Details  Name: Erin [name] Date of Birth: 1951-02-12   Transition of Care [facility]) CM/SW Contact:    Leeroy Cha, RN Phone Number: 12/19/2021, 9:31 AM    Transition of Care Department [facility]) has reviewed patient and no TOC needs have been identified at this time. We will continue to monitor patient advancement through interdisciplinary progression rounds. If new patient transition needs arise, please place a TOC consult.     Expected Discharge Plan: Home/Self Care Barriers to Discharge: Continued Medical Work up   Patient Goals and CMS Choice Patient states their goals for this hospitalization and ongoing recovery are:: to go home CMS Medicare.gov Compare Post Acute Care list provided to:: Patient    Expected Discharge Plan and Services Expected Discharge Plan: Home/Self Care   Discharge Planning Services: CM Consult   Living arrangements for the past 2 months: Single Family Home                                      Prior Living Arrangements/Services Living arrangements for the past 2 months: Single Family Home Lives with:: Self Patient language and need for interpreter reviewed:: Yes Do you feel safe going back to the place where you live?: Yes            Criminal Activity/Legal Involvement Pertinent to Current Situation/Hospitalization: No - Comment as needed  Activities of Daily Living      Permission Sought/Granted                  Emotional Assessment Appearance:: Appears stated age Attitude/Demeanor/Rapport: Engaged Affect (typically observed): Calm Orientation: : Oriented to Self, Oriented  to Place, Oriented to  Time, Oriented to Situation Alcohol / Substance Use: Tobacco Use, Alcohol Use (current use of both) Psych Involvement: No (comment)  Admission diagnosis:  Multifocal pneumonia [J18.9] Community acquired pneumonia, unspecified laterality [J18.9] Patient Active Problem List   Diagnosis Date Noted   Multifocal pneumonia 12/18/2021   Sepsis due to pneumonia ([place]) 12/17/2021   Depression with anxiety 12/17/2021   Hypokalemia 12/17/2021   Acute respiratory failure with hypoxia ([place]) 12/17/2021   Essential hypertension 12/17/2021   Prolonged QT interval 12/17/2021   PCP:  Aura Dials, PA-C Pharmacy:   [id number] - [place], [address]. AT [address] [place] [address] Phone: [phone] Fax: [phone]     Social Determinants of Health (SDOH) Interventions    Readmission Risk Interventions   No data to display

## 2021-12-19 NOTE — Progress Notes (Signed)
  Progress Note   Patient: Erin [name] BLT:[id number] DOB: 01-12-1951 DOA: 12/17/2021     2 DOS: the patient was seen and examined on 12/19/2021   Brief hospital course: 71 y.o. female with medical history significant for depression, anxiety, and history of alcohol abuse in remission who presents emergency department for evaluation of cough and shortness of breath.   Patient initially sought care at an urgent care for 2 days of progressive cough and shortness of breath, was found to be hypoxic, and directed to the ED. In ED, pt was found to have xray findings worrisome for multifocal PNA with leukocytosis. Pt was started on abx  Assessment and Plan: 1. Severe sepsis d/t pneumonia; acute hypoxic respiratory failure, present on admission  - Found to have multifocal PNA with fever, leukocytosis, tachypnea, and acute hypoxic respiratory failure   - Started on Rocephin and azithromycin in ED  - Still requiring 15L high flow O2 this AM, reportedly unable to wean. Pt does not appear in distress and even reports feeling somewhat better today -Have ordered CTA chest, pending   2. HTN  -BP stable at present - Continue clonidine     3. Depression, anxiety  - Continue Wellbutrin, Prozac, Latuda, and hydroxyzine      4. Prolonged QT interval  - QTc is 507 ms in ED  -potassium corrected, avoid QT-prolonging medications       Subjective: Reports subjectively feeling better today  Physical Exam: Vitals:   12/19/21 0900 12/19/21 1000 12/19/21 1100 12/19/21 1200  BP: (!) 109/39 (!) 133/58 (!) 134/57 (!) 156/65  Pulse: 77 81 82 92  Resp: (!) 28 (!) 44 (!) 43 (!) 36  Temp:    98.7 F (37.1 C)  TempSrc:    Oral  SpO2: 98% 98% 98% (!) 88%  Weight:       General exam: Conversant, in no acute distress Respiratory system: normal chest rise, clear, no audible wheezing Cardiovascular system: regular rhythm, s1-s2 Gastrointestinal system: Nondistended, nontender, pos BS Central nervous  system: No seizures, no tremors Extremities: No cyanosis, no joint deformities Skin: No rashes, no pallor Psychiatry: Affect normal // no auditory hallucinations   Data Reviewed:  Labs reviewed: Na 132, K 4.0, Cr 0.57, WBC 11.9, hgb 10.6  Family Communication: Pt in room, family not at bedside  Disposition: Status is: Inpatient Remains inpatient appropriate because: Severity of illness  Planned Discharge Destination: Home    Author: Marylu Lund, MD 12/19/2021 2:28 PM  For on call review [email or web address].

## 2021-12-19 NOTE — Consult Note (Signed)
NAME:  Erin [name], MRN:  [id number], DOB:  [birth year], LOS: 2 ADMISSION DATE:  12/17/2021, CONSULTATION DATE:  12/19/21 REFERRING MD:  Dr Wyline Copas of [facility] CHIEF COMPLAINT:  Acute hypoxemic resp failure,    History of Present Illness:   71 year old smoker who is retired.  Suffers from depression, anxiety, previous history of alcohol abuse in remission currently on December 12, 2021 Monday her 52-year-old granddaughter got diagnosed with her first ever RSV infection.  She did have substantial exposure to this granddaughter.  Then on 12/15/2021 Thursday patient became ill abruptly and presented to the ER on 12/17/2021 with 2-day history of progressive cough and shortness of breath.  Found to be hypoxemic.  CT angiogram today ruled out pulmonary embolism but showed diffuse groundglass opacities raising concern for a broad list of differential diagnosis.  She is on antibacterial community-acquired pneumonia antibiotics with Rocephin and azithromycin but without improvement.  Requiring 15 L high flow nasal cannula and subjectively feeling okay and watching TV but is actually tachypneic.  She was febrile 101 Fahrenheit with a high white count consistent with SIRS physiology.  Concern for bacterial pneumonia/sepsis.  Given the presence of infiltrates and hypoxemia  Her blood eosinophils were high at 900 cells per cubic millimeter at admission.  Previously normal.  No prior history of asthma.  No mold exposure in the house.  No feather pillow or feather blankets in the house.  No pet birds in the house.   Critical care medicine called for consultation.    Past Medical History:    has a past medical history of Alcohol abuse (11/08/2016), Alcoholism ([place]), Depression, and Hyperlipemia.   reports that she has been smoking cigarettes. She has been smoking an average of 1 pack per day. She has never used smokeless tobacco.  Past Surgical History:  Procedure Laterality Date   NOSE SURGERY       Allergies  Allergen Reactions   Lamictal [Lamotrigine] Swelling    Caused swelling in the face   Vistaril [Hydroxyzine Hcl] Swelling    Caused swelling in the face    Immunization History  Administered Date(s) Administered   Influenza, Seasonal, Injecte, Preservative Fre 11/12/2014   Influenza-Unspecified 12/15/2016   Td 03/08/2017   Tdap 05/22/2011, 04/01/2012   Zoster, Live 10/28/2015    Family History  Problem Relation Age of Onset   Heart failure Father      Current Facility-Administered Medications:    acetaminophen (TYLENOL) tablet 650 mg, 650 mg, Oral, Q6H PRN, Opyd, Ilene Qua, MD, 650 mg at 12/19/21 1147   atorvastatin (LIPITOR) tablet 20 mg, 20 mg, Oral, QPM, Opyd, Timothy S, MD, 20 mg at 12/19/21 1640   buPROPion (WELLBUTRIN XL) 24 hr tablet 150 mg, 150 mg, Oral, q morning, Opyd, Timothy S, MD, 150 mg at 12/19/21 3235   cefTRIAXone (ROCEPHIN) 2 g in sodium chloride 0.9 % 100 mL IVPB, 2 g, Intravenous, Q24H, Opyd, Ilene Qua, MD, Stopped at 12/18/21 2000   Chlorhexidine Gluconate Cloth 2 % PADS 6 each, 6 each, Topical, Daily, Opyd, Ilene Qua, MD, 6 each at 12/19/21 1528   cloNIDine (CATAPRES) tablet 0.1 mg, 0.1 mg, Oral, QHS, Opyd, Timothy S, MD, 0.1 mg at 12/18/21 2207   doxycycline (VIBRA-TABS) tablet 100 mg, 100 mg, Oral, Q12H, Opyd, Timothy S, MD, 100 mg at 12/19/21 0746   enoxaparin (LOVENOX) injection 40 mg, 40 mg, Subcutaneous, Q24H, Opyd, Ilene Qua, MD, 40 mg at 12/18/21 2207   FLUoxetine (PROZAC) capsule 40 mg, 40 mg, Oral,  Daily, Opyd, Ilene Qua, MD, 40 mg at 12/19/21 8099   hydrOXYzine (ATARAX) tablet 25 mg, 25 mg, Oral, Daily, Opyd, Ilene Qua, MD, 25 mg at 12/19/21 8338   lurasidone (LATUDA) tablet 40 mg, 40 mg, Oral, QHS, Opyd, Ilene Qua, MD, 40 mg at 12/18/21 2207   nicotine (NICODERM CQ - dosed in mg/24 hours) patch 21 mg, 21 mg, Transdermal, Daily, Opyd, Ilene Qua, MD, 21 mg at 12/19/21 2505   Oral care mouth rinse, 15 mL, Mouth Rinse, PRN, Opyd,  Ilene Qua, MD   oxyCODONE (Oxy IR/ROXICODONE) immediate release tablet 5 mg, 5 mg, Oral, Q4H PRN, Opyd, Ilene Qua, MD, 5 mg at 12/18/21 0914   senna-docusate (Senokot-S) tablet 1 tablet, 1 tablet, Oral, QHS PRN, Opyd, Ilene Qua, MD   sodium chloride (PF) 0.9 % injection, , , ,    sodium chloride flush (NS) 0.9 % injection 3 mL, 3 mL, Intravenous, Q12H, Opyd, Ilene Qua, MD, 3 mL at 12/19/21 [facility] Events:  12/17/2021 - admit 12/19/21 - ccm consult  Interim History / Subjective:   12/19/2021 - seen in [facility] ICU 1240  Objective   Blood pressure 127/60, pulse 74, temperature 98.7 F (37.1 C), temperature source Oral, resp. rate (!) 33, weight 91.6 kg, SpO2 97 %.        Intake/Output Summary (Last 24 hours) at 12/19/2021 1745 Last data filed at 12/19/2021 1643 Gross per 24 hour  Intake 241.2 ml  Output 1100 ml  Net -858.8 ml   Filed Weights   12/17/21 1657  Weight: 91.6 kg    Examination: General: Pleasant female.  Watching TV. HENT: Oxygen 15 L high flow nasal cannula on Lungs: Mildly tachypneic respiratory rate around 28.  Looks happy hypoxemic.  Mild distant crackles present.  No paradoxical respiration able to talk full sentences Cardiovascular: Mild sinus tachycardia Abdomen: Soft nontender no organomegaly Extremities: No cyanosis no clubbing no edema Neuro: Alert and oriented x3 GU: Not examined  Resolved Hospital Problem list   X  Assessment & Plan:   Acute hypoxemic respiratory failure with pulmonary infiltrates  -Possible bacterial process given fever and high white count  -High previous probability for primary RSV infection given sick contact  -Differential diagnosis of acute eosinophilic pneumonia although the first 7 days peripheral blood eosinophils are normal  12/19/2021 -> 15 L high flow nasal cannula severe hypoxemia  P:   Check ABG Check serology Check vasculitis panel Check QuantiFERON gold Check inflammatory biomarkers of  sedimentation rate, CRP  Start BiPAP nightly [I explained this procedure and rationale to the patient and took a shared decision making]  Get BNP, troponin and echocardiogram Lasix 20 mg x 1 empiric  Urine strep, urine Legionella and procalcitonin Check multiplex respiratory virus panel Continue CAP coverage for now   Intubate if worse -> then consider Bronch BAL Hold off steroids for the moment    Best practice (daily eval):  Diet: clear liquidd diet only with meds Rest per [facility] MD   Goals of Care:    Family Updates: updated patient  D/w Dr Wyline Copas of [facility]     ATTESTATION & SIGNATURE   The patient LISETTE MANCEBO is critically ill with multiple organ systems failure and requires high complexity decision making for assessment and support, frequent evaluation and titration of therapies, application of advanced monitoring technologies and extensive interpretation of multiple databases.   Critical Care Time devoted to patient care services described in this note is  81  Minutes. This time reflects time of care of this signee Dr Brand Males. This critical care time does not reflect procedure time, or teaching time or supervisory time of PA/NP/Med student/Med Resident etc but could involve care discussion time      SIGNATURE    Dr. Brand Males, M.D., F.C.C.P,  Pulmonary and Critical Care Medicine Staff Physician, [place] [facility] Director - Interstitial Lung Disease  Program  Pulmonary [place] at [place], [place], [address]  NPI Number:  NPI #[id number]  Pager: [phone], If no answer  -> Check AMION or Try [phone] Telephone (clinical office): [phone] Telephone (research): [phone]  5:45 PM 12/19/2021   12/19/2021 5:45 PM    LABS    Latest Reference Range & Units 07/14/09 20:23 09/06/09 13:50 05/16/20 17:51 12/17/21 18:50  Eosinophils Absolute 0.0 - 0.5 K/uL 0.0 0.0  0.0 0.9 (H)  (H): Data is abnormally high PULMONARY No results for input(s): "PHART", "PCO2ART", "PO2ART", "HCO3", "TCO2", "O2SAT" in the last 168 hours.  Invalid input(s): "PCO2", "PO2"  CBC Recent Labs  Lab 12/17/21 1850 12/18/21 0600 12/19/21 0322  HGB 11.2* 10.3* 10.6*  HCT 33.0* 30.8* 31.7*  WBC 12.8* 11.0* 11.9*  PLT 409* 392 399    COAGULATION Recent Labs  Lab 12/18/21 0040  INR 1.2    CARDIAC  No results for input(s): "TROPONINI" in the last 168 hours. No results for input(s): "PROBNP" in the last 168 hours.  CHEMISTRY Recent Labs  Lab 12/17/21 1850 12/18/21 0040 12/18/21 0600 12/19/21 0322  NA 136  --  132* 132*  K 3.3*  --  3.8 4.0  CL 100  --  100 100  CO2 26  --  25 24  GLUCOSE 113*  --  106* 108*  BUN 12  --  11 10  CREATININE 0.79  --  0.58 0.57  CALCIUM 9.0  --  8.6* 8.7*  MG  --  1.9  --   --    CrCl cannot be calculated (Unknown ideal weight.).   LIVER Recent Labs  Lab 12/17/21 1850 12/18/21 0040 12/18/21 0600  AST 37  --  37  ALT 36  --  33  ALKPHOS 209*  --  202*  BILITOT 0.9  --  0.8  PROT 6.7  --  6.1*  ALBUMIN 2.6*  --  2.3*  INR  --  1.2  --      INFECTIOUS Recent Labs  Lab 12/18/21 0040 12/18/21 0600 12/19/21 0322  LATICACIDVEN 1.3  --   --   PROCALCITON 0.18 0.13 0.13     ENDOCRINE CBG (last 3)  No results for input(s): "GLUCAP" in the last 72 hours.       IMAGING x48h  - image(s) personally visualized  -   highlighted in bold CT Angio Chest Pulmonary Embolism (PE) W or WO Contrast  Result Date: 12/19/2021 CLINICAL DATA:  Shortness of breath. Evaluate for pulmonary embolism. EXAM: CT ANGIOGRAPHY CHEST WITH CONTRAST TECHNIQUE: Multidetector CT imaging of the chest was performed using the standard protocol during bolus administration of intravenous contrast. Multiplanar CT image reconstructions and MIPs were obtained to evaluate the vascular anatomy. RADIATION DOSE REDUCTION: This exam was performed  according to the departmental dose-optimization program which includes automated exposure control, adjustment of the mA and/or kV according to patient size and/or use of iterative reconstruction technique. CONTRAST:  45m OMNIPAQUE IOHEXOL 350 MG/ML SOLN COMPARISON:  Chest radiograph -12/17/2021  FINDINGS: Vascular Findings: There is adequate opacification of the pulmonary arterial system with the main pulmonary artery measuring 221 Hounsfield units. No discrete filling defects are seen within the pulmonary arterial tree to suggest pulmonary embolism. Normal caliber of the main pulmonary artery. Borderline cardiomegaly. Coronary artery calcifications. No pericardial effusion though a small amount of fluid is seen within the pericardial recess. No evidence of thoracic aortic aneurysm or dissection on this nongated examination. Bovine configuration of the aortic arch. Atherosclerotic plaque involving the aortic arch and descending thoracic aorta, not resulting in a hemodynamically significant stenosis. The descending thoracic aorta is tortuous but of normal caliber. Review of the MIP images confirms the above findings. ---------------------------------------------------------------------------------- Nonvascular Findings: Mediastinum/Lymph Nodes: Borderline enlarged mediastinal and hilar lymph nodes with index precarinal lymph node measuring 1 cm in greatest short axis diameter (image 30, series 4), index prevascular lymph node measuring 0.9 cm (image 72), index right paratracheal lymph node measuring 0.9 cm (image 18), index right infrahilar lymph node measuring 0.9 cm (image 43, series 4 and index left hilar nodal conglomeration measuring 1.0 (image 35) and 0.9 cm (image 40, series 4), nonspecific. No axillary lymphadenopathy. Lungs/Pleura: Extensive bilateral crazy paving pattern as evidenced by extensive ground-glass opacities and interlobular septal thickening, progressed compared to chest radiograph performed  12/17/2021. Minimal subpleural atelectasis involving the anterior aspects of the bilateral upper lobes. No associated air bronchograms. The central pulmonary airways appear widely patent. Trace bilateral pleural effusions, left-greater-than-right. Evaluation for discrete pulmonary nodules is degraded secondary to extensive parenchymal abnormalities. Upper abdomen: Limited early arterial phase evaluation of the upper abdomen demonstrates an approximately 5.3 x 3.4 cm hypoattenuating lesion involving the central aspect of the right lobe of the liver (image 81, series 4), incompletely evaluated on the present examination though potentially representative of a minimally complex hepatic cyst. Musculoskeletal: No acute or aggressive osseous abnormalities. Incidental note made of a right-sided cervical rib. Moderate DDD of C6-C7 with disc space height loss, endplate irregularity and sclerosis. Regional soft tissues appear normal. Normal appearance of the thyroid gland. IMPRESSION: 1. No evidence of pulmonary embolism. 2. Extensive bilateral crazy paving pattern characterized by extensive bilateral ground-glass opacities and interlobular septal thickening. Differential considerations are broad and include pulmonary edema, diffuse alveolar hemorrhage, ARDS, acute hypersensitivity pneumonitis and (favored) atypical infection (including COVID-19 pneumonia). Clinical correlation is advised. Follow-up chest radiograph in 4-6 weeks is recommended to ensure resolution. 3. Borderline enlarged mediastinal and hilar lymphadenopathy, nonspecific though presumably reactive in etiology. 4. Borderline cardiomegaly. 5. Coronary artery calcifications. Aortic Atherosclerosis (ICD10-I70.0). 6. Indeterminate approximately 5.3 cm hypoattenuating lesion within the central aspect of the right lobe of the liver, potentially representative of a complex hepatic cyst, though incompletely evaluated on the present examination. Comparison with prior  outside examinations (if available), is advised. Otherwise, further evaluation with nonemergent abdominal MRI is recommended for further characterization. Electronically Signed   By: Sandi Mariscal M.D.   On: 12/19/2021 16:22

## 2021-12-20 ENCOUNTER — Inpatient Hospital Stay (HOSPITAL_COMMUNITY): Payer: Medicare Other

## 2021-12-20 DIAGNOSIS — S27309A Unspecified injury of lung, unspecified, initial encounter: Secondary | ICD-10-CM | POA: Clinically undetermined

## 2021-12-20 DIAGNOSIS — I509 Heart failure, unspecified: Secondary | ICD-10-CM

## 2021-12-20 DIAGNOSIS — S27309D Unspecified injury of lung, unspecified, subsequent encounter: Secondary | ICD-10-CM | POA: Diagnosis not present

## 2021-12-20 DIAGNOSIS — J9601 Acute respiratory failure with hypoxia: Secondary | ICD-10-CM | POA: Diagnosis not present

## 2021-12-20 DIAGNOSIS — A419 Sepsis, unspecified organism: Secondary | ICD-10-CM | POA: Diagnosis not present

## 2021-12-20 DIAGNOSIS — J189 Pneumonia, unspecified organism: Secondary | ICD-10-CM | POA: Diagnosis not present

## 2021-12-20 LAB — RESPIRATORY PANEL BY PCR

## 2021-12-20 LAB — CBC
HCT: 32.1 % — ABNORMAL LOW (ref 36.0–46.0)
Hemoglobin: 10.6 g/dL — ABNORMAL LOW (ref 12.0–15.0)
MCH: 29 pg (ref 26.0–34.0)
MCHC: 33 g/dL (ref 30.0–36.0)
MCV: 87.7 fL (ref 80.0–100.0)
Platelets: 389 K/uL (ref 150–400)
RBC: 3.66 MIL/uL — ABNORMAL LOW (ref 3.87–5.11)
RDW: 13.8 % (ref 11.5–15.5)
WBC: 12.1 K/uL — ABNORMAL HIGH (ref 4.0–10.5)
nRBC: 0 % (ref 0.0–0.2)

## 2021-12-20 LAB — BASIC METABOLIC PANEL WITH GFR
Anion gap: 11 (ref 5–15)
BUN: 13 mg/dL (ref 8–23)
CO2: 25 mmol/L (ref 22–32)
Calcium: 8.9 mg/dL (ref 8.9–10.3)
Chloride: 97 mmol/L — ABNORMAL LOW (ref 98–111)
Creatinine, Ser: 0.67 mg/dL (ref 0.44–1.00)
GFR, Estimated: >60 mL/min
Glucose, Bld: 113 mg/dL — ABNORMAL HIGH (ref 70–99)
Potassium: 3.9 mmol/L (ref 3.5–5.1)
Sodium: 133 mmol/L — ABNORMAL LOW (ref 135–145)

## 2021-12-20 LAB — ECHOCARDIOGRAM COMPLETE
AR max vel: 3.47 cm2
AV Area VTI: 3.56 cm2
AV Area mean vel: 3.42 cm2
AV Mean grad: 3 mmHg
AV Peak grad: 5.4 mmHg
Ao pk vel: 1.16 m/s
Area-P 1/2: 3.53 cm2
Calc EF: 67.4 %
MV M vel: 2.45 m/s
MV Peak grad: 23.9 mmHg
S' Lateral: 3.2 cm
Single Plane A2C EF: 64.9 %
Single Plane A4C EF: 68.1 %
Weight: 3232 [oz_av]

## 2021-12-20 LAB — PHOSPHORUS: Phosphorus: 4.1 mg/dL (ref 2.5–4.6)

## 2021-12-20 LAB — GLUCOSE, CAPILLARY
Glucose-Capillary: 113 mg/dL — ABNORMAL HIGH (ref 70–99)
Glucose-Capillary: 147 mg/dL — ABNORMAL HIGH (ref 70–99)
Glucose-Capillary: 154 mg/dL — ABNORMAL HIGH (ref 70–99)

## 2021-12-20 LAB — MAGNESIUM: Magnesium: 1.9 mg/dL (ref 1.7–2.4)

## 2021-12-20 LAB — HEMOGLOBIN A1C
Hgb A1c MFr Bld: 5.5 % (ref 4.8–5.6)
Mean Plasma Glucose: 111.15 mg/dL

## 2021-12-20 MED ORDER — PERFLUTREN LIPID MICROSPHERE
1.0000 mL | INTRAVENOUS | Status: AC | PRN
  Administered 2021-12-20: 2 mL via INTRAVENOUS

## 2021-12-20 MED ORDER — LIDOCAINE HCL 2 % IJ SOLN
INTRAMUSCULAR | Status: AC
  Filled 2021-12-20: qty 20

## 2021-12-20 MED ORDER — OLANZAPINE 10 MG IM SOLR
2.5000 mg | Freq: Once | INTRAMUSCULAR | Status: AC | PRN
  Administered 2021-12-20: 2.5 mg via INTRAMUSCULAR
  Filled 2021-12-20 (×2): qty 10

## 2021-12-20 MED ORDER — MIDAZOLAM HCL 2 MG/2ML IJ SOLN: 2.0000 mg | Freq: Once | INTRAMUSCULAR | Status: AC

## 2021-12-20 MED ORDER — LIDOCAINE HCL 4 % EX SOLN
Freq: Once | CUTANEOUS | Status: DC
  Filled 2021-12-20: qty 50

## 2021-12-20 MED ORDER — ORAL CARE MOUTH RINSE
15.0000 mL | OROMUCOSAL | Status: DC
  Administered 2021-12-21 – 2021-12-23 (×11): 15 mL via OROMUCOSAL

## 2021-12-20 MED ORDER — ORAL CARE MOUTH RINSE: 15.0000 mL | OROMUCOSAL | Status: DC | PRN

## 2021-12-20 MED ORDER — LIDOCAINE HCL (PF) 2 % IJ SOLN
5.0000 mL | Freq: Once | INTRAMUSCULAR | Status: AC
  Administered 2021-12-20: 5 mL
  Filled 2021-12-20: qty 5

## 2021-12-20 MED ORDER — FENTANYL CITRATE PF 50 MCG/ML IJ SOSY: 50.0000 ug | PREFILLED_SYRINGE | Freq: Once | INTRAMUSCULAR | Status: AC

## 2021-12-20 MED ORDER — INSULIN ASPART 100 UNIT/ML IJ SOLN
0.0000 [IU] | INTRAMUSCULAR | Status: DC
  Administered 2021-12-20: 2 [IU] via SUBCUTANEOUS
  Administered 2021-12-20 – 2021-12-21 (×3): 3 [IU] via SUBCUTANEOUS
  Administered 2021-12-21 (×2): 2 [IU] via SUBCUTANEOUS
  Administered 2021-12-22 (×2): 3 [IU] via SUBCUTANEOUS
  Administered 2021-12-22: 2 [IU] via SUBCUTANEOUS
  Administered 2021-12-22: 3 [IU] via SUBCUTANEOUS
  Administered 2021-12-22: 5 [IU] via SUBCUTANEOUS
  Administered 2021-12-22: 3 [IU] via SUBCUTANEOUS
  Administered 2021-12-22 – 2021-12-23 (×3): 2 [IU] via SUBCUTANEOUS
  Administered 2021-12-23 (×2): 3 [IU] via SUBCUTANEOUS

## 2021-12-20 MED ORDER — FENTANYL CITRATE PF 50 MCG/ML IJ SOSY
PREFILLED_SYRINGE | INTRAMUSCULAR | Status: AC
  Administered 2021-12-20: 50 ug via INTRAVENOUS
  Filled 2021-12-20: qty 2

## 2021-12-20 MED ORDER — DEXMEDETOMIDINE HCL IN NACL 400 MCG/100ML IV SOLN
0.4000 ug/kg/h | INTRAVENOUS | Status: DC
  Administered 2021-12-20: 0.4 ug/kg/h via INTRAVENOUS
  Filled 2021-12-20 (×2): qty 100

## 2021-12-20 MED ORDER — STERILE WATER FOR INJECTION IJ SOLN
INTRAMUSCULAR | Status: AC
  Administered 2021-12-20: 10 mL
  Filled 2021-12-20: qty 10

## 2021-12-20 MED ORDER — MIDAZOLAM HCL 2 MG/2ML IJ SOLN
INTRAMUSCULAR | Status: AC
  Administered 2021-12-20: 2 mg via INTRAVENOUS
  Filled 2021-12-20: qty 2

## 2021-12-20 MED ORDER — METHYLPREDNISOLONE SODIUM SUCC 125 MG IJ SOLR
125.0000 mg | Freq: Four times a day (QID) | INTRAMUSCULAR | Status: DC
  Administered 2021-12-20 – 2021-12-22 (×7): 125 mg via INTRAVENOUS
  Filled 2021-12-20 (×7): qty 2

## 2021-12-20 MED ORDER — MAGNESIUM SULFATE 2 GM/50ML IV SOLN
2.0000 g | Freq: Once | INTRAVENOUS | Status: AC
  Administered 2021-12-20: 2 g via INTRAVENOUS
  Filled 2021-12-20: qty 50

## 2021-12-20 NOTE — Op Note (Signed)
Name:  Erin [name] MRN:  [id number] DOB:  1950-10-13  PROCEDURE NOTE  Procedure(s): Flexible bronchoscopy [phone]) Bronchial alveolar lavage [phone]) of the LINGULA   Indications:  Acute Lung injury, Acute resp failure  Consent:  Procedure, benefits, risks and alternatives discussed.  Questions answered.  Consent obtained.  Anesthesia:  Moderate Sedation fent 76mg and versed 232m Precedex gtt  Location: [facility] ICU 1240  Procedure summary:  Appropriate equipment was assembled.  The patient was brought to the procedure suite room and identified as Erin [name] 3/Apr 18, 1952Safety timeout was performed. The patient was placed supine on the  table, airway and moderate sedation administered by this operator  After the appropriate level of moderation was assured, flexible video bronchoscope was lubricated and inserted through the Right naris. Prior to that patient was given nebulzied lidocaine . Pusle ox was 100% on 15L HFNC. REgulra size bronch would NOT pass through either naris. Very small amount of epistaxis.   Then ultra-thin bronc used. Vocal cords looied normal. Airway entered. WEnt tto lingular and did 2062m 3 of saline BAL. Onl 8cc of clear returns obtained.  Pulse ox dropped to 87% and procedure terminated.   Airway examination was NOT performed bilaterally to subsegmental level.    After hemostasis was assure, the bronchoscope was withdrawn.   Post-procedure chest x-ray was YES ordered.  Specimens sent: Bronchial alveolar lavage specimen of the LINGULAR for RVP for cell count  anmd microbiology due to limited amount  Complications:  No immediate complications were noted.  Hemodynamic parameters and oxygenation remained stable throughout the procedure.  Estimated blood loss:  NONE  IMPRESSION 1. BAL Lingula 2. No alveolar hemorrhage 3. BAL for cell count #1 prioroity  Followup Rounding in ICU  Dr. MurBrand Males.D.,  F.CPorter[facility] Developmental CenterP Pulmonary and Critical Care Medicine Staff Physician ConNorth [facility] Buttelmonary and Critical Care Pager: [phone] no answer or between  15:00h - 7:00h: call [phone]  12/20/2021 4:21 PM

## 2021-12-20 NOTE — Progress Notes (Signed)
RT NOTE:  Pt placed on BiPAP per CCMD order. Pt tolerating BiPAP well at this time, per order pt to wear for 2 hours and be off for 4 hours, RT will take pt off BiPAP at 11:45.

## 2021-12-20 NOTE — Consult Note (Addendum)
NAME:  Erin [name], MRN:  [id number], DOB:  13-Feb-1951, LOS: 3 ADMISSION DATE:  12/17/2021, CONSULTATION DATE:  12/19/21 REFERRING MD:  Dr Wyline Copas of [facility] CHIEF COMPLAINT:  Acute hypoxemic resp failure,    BRIEF   71 year old "big time" smoker who is retired.  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 Suffers from depression, anxiety, previous history of alcohol abuse in remission currently on December 12, 2021 Monday her 62-year-old granddaughter got diagnosed with her first ever RSV infection (also had covid a month ago from day care).  She did have substantial exposure to this granddaughter.  Then on 12/15/2021 Thursday patient became ill abruptly and presented to the ER on 12/17/2021 with 2-day history of progressive cough and shortness of breath.  Found to be hypoxemic.  CT angiogram today ruled out pulmonary embolism but showed diffuse groundglass opacities raising concern for a broad list of differential diagnosis.  She is on antibacterial community-acquired pneumonia antibiotics with Rocephin and azithromycin but without improvement.  Requiring 15 L high flow nasal cannula and subjectively feeling okay and watching TV but is actually tachypneic.  She was febrile 101 Fahrenheit with a high white count consistent with SIRS physiology.  Concern for bacterial pneumonia/sepsis.  Given the presence of infiltrates and hypoxemia  Her blood eosinophils were high at 900 cells per cubic millimeter at admission.  Previously normal.  No prior history of asthma.  No mold exposure in the house.  No feather pillow or feather blankets in the house.  No pet birds in the house. Noted per daughte 12/20/21" old home . There could be mold. Dog in home urinates all over   Critical care medicine called for consultation.    Past Medical History:    has a past medical history of Alcohol abuse (11/08/2016), Alcoholism ([place]), Depression, and Hyperlipemia.   reports that she has been smoking cigarettes. She has been  smoking an average of 1 pack per day. She has never used smokeless tobacco.  Past Surgical History:  Procedure Laterality Date   NOSE SURGERY      Allergies  Allergen Reactions   Lamictal [Lamotrigine] Swelling    Caused swelling in the face   Vistaril [Hydroxyzine Hcl] Swelling    Caused swelling in the face    Immunization History  Administered Date(s) Administered   Influenza, Seasonal, Injecte, Preservative Fre 11/12/2014   Influenza-Unspecified 12/15/2016   Td 03/08/2017   Tdap 05/22/2011, 04/01/2012   Zoster, Live 10/28/2015    Family History  Problem Relation Age of Onset   Heart failure Father      Significant Hospital Events:  12/17/2021 - admit 12/19/21 - ccm consult  - LDH 450  - CRP 25/ ESR 111  - BNP 107  - EOS 1200 (amdit 600)  - Quant GOld >>  - RVP 12/19/21 - NEG  - VASCULITIS PANEL >>  - SEROLOGY panel >>  - Urine strep/leg >>  Interim History / Subjective:   12/20/2021 - seen in [facility] ICU 1240. Used BiPAP last night. On 13L HFNC Feeling some better. Lab markers show sever inflammation  Objective   Blood pressure (!) 121/57, pulse 80, temperature 98.4 F (36.9 C), temperature source Axillary, resp. rate (!) 33, weight 91.6 kg, SpO2 99 %.    FiO2 (%):  [60 %] 60 %   Intake/Output Summary (Last 24 hours) at 12/20/2021 0857 Last data filed at 12/20/2021 0622 Gross per 24 hour  Intake 99.86 ml  Output 2100 ml  Net -2000.14 ml  Filed Weights   12/17/21 1657  Weight: 91.6 kg    Examination: General: Pleasant female.  Watching TV. HENT: Oxygen 15 L high flow nasal cannula on Lungs: Mildly tachypneic respiratory rate around 28.  Looks happy hypoxemic.  Mild distant crackles present.  No paradoxical respiration able to talk full sentences Cardiovascular: Mild sinus tachycardia Abdomen: Soft nontender no organomegaly Extremities: No cyanosis no clubbing no edema Neuro: Alert and oriented x3 GU: Not examined  Resolved Hospital Problem list    X  Assessment & Plan:   Acute hypoxemic respiratory failure with pulmonary infiltrates  -Possible bacterial process given fever and high white count  -High previous probability for primary RSV infection given sick contact  -Differential diagnosis of acute eosinophilic pneumonia although the first 7 days peripheral blood eosinophils are normal  12/20/2021 -> 13 L high flow nasal cannula severe hypoxemia. LAbs indicating ? BOOP (post viral) v AEP RVP negative. ANemia new onset this admit. ? Rule out alv hemorrhage  P:   Await  serology Await vasculitis panel Await QuantiFERON gold  Continue BiPAP nightly  and day time 1-2 application  Await echocardiogram   Urine strep, urine Legionella awaited    \Consider Bronch BAL 12/20/2021 and then steroids  - Risks of pneumothorax, hemothorax, sedation/anesthesia complications such as cardiac or respiratory arrest or hypotension, stroke and bleeding all explained. Benefits of diagnosis but limitations of non-diagnosis also explained. Patient verbalized understanding and wished to proceed.   - will aim do under precedex and with very minimal fent/versed   Continue CAP coverage for now   Addendum 2:18 PM - updated daughter in detail. She agrees with bronch and risks and wishes to proceed. Risks of pneumothorax, hemothorax, sedation/anesthesia complications such as cardiac or respiratory arrest or hypotension, stroke and bleeding all explained. Benefits of diagnosis but limitations of non-diagnosis also explained. Patient verbalized understanding and wished to proceed.    Best practice (daily eval):  Diet: cNPO but for meds prior to [place] per [facility] MD   Goals of Care:    Family Updates: updated patient and late rdaughter  D/w Dr Wyline Copas of [facility]      [place]   The patient Erin [name] is critically ill with multiple organ systems failure and requires high complexity decision making for assessment and  support, frequent evaluation and titration of therapies, application of advanced monitoring technologies and extensive interpretation of multiple databases.   Critical Care Time devoted to patient care services described in this note is  35  Minutes. This time reflects time of care of this signee Dr Brand Males. This critical care time does not reflect procedure time, or teaching time or supervisory time of PA/NP/Med student/Med Resident etc but could involve care discussion time     Dr. Brand Males, M.D., [facility].C.P Pulmonary and Critical Care Medicine Medical Director - [facility] ICU Staff Physician, [facility] Pager: [phone], If no answer or between  15:00h - 7:00h: call [phone]  [phone]  12/20/2021 9:36 AM     SIGNATURE    Dr. Brand Males, M.D., F.C.C.P,  Pulmonary and Critical Care Medicine Staff Physician, [place] Director - Interstitial Lung Disease  Program  Pulmonary [place] [facility] [place], [place], [address]  NPI Number:  NPI #[id number]  Pager: [phone], If no answer  -> Check AMION or Try [place] Telephone (clinical office): [phone]  [place] (research): [phone]  8:57 AM 12/20/2021   12/20/2021 8:57 AM    LABS    Latest Reference Range & Units 07/14/09 20:23 09/06/09 13:50 05/16/20 17:51 12/17/21 18:50  Eosinophils Absolute 0.0 - 0.5 K/uL 0.0 0.0 0.0 0.9 (H)  (H): Data is abnormally high PULMONARY Recent Labs  Lab 12/19/21 1843  PHART 7.52*  PCO2ART 35  PO2ART 67*  HCO3 28.6*  O2SAT 95.8    CBC Recent Labs  Lab 12/19/21 0322 12/19/21 1855 12/20/21 0333  HGB 10.6* 11.1* 10.6*  HCT 31.7* 34.0* 32.1*  WBC 11.9* 13.6* 12.1*  PLT 399 429* 389    COAGULATION Recent Labs  Lab 12/18/21 0040  INR 1.2    CARDIAC  No results for input(s): "TROPONINI" in the last 168 hours. No results for  input(s): "PROBNP" in the last 168 hours.  CHEMISTRY Recent Labs  Lab 12/17/21 1850 12/18/21 0040 12/18/21 0600 12/19/21 0322 12/20/21 0333  NA 136  --  132* 132* 133*  K 3.3*  --  3.8 4.0 3.9  CL 100  --  100 100 97*  CO2 26  --  _0 GLUCOSE 113*  --  106* 108* 113*  BUN 12  --  _1 CREATININE 0.79  --  0.58 0.57 0.67  CALCIUM 9.0  --  8.6* 8.7* 8.9  MG  --  1.9  --   --  1.9  PHOS  --   --   --   --  4.1   CrCl cannot be calculated (Unknown ideal weight.).   LIVER Recent Labs  Lab 12/17/21 1850 12/18/21 0040 12/18/21 0600  AST 37  --  37  ALT 36  --  33  ALKPHOS 209*  --  202*  BILITOT 0.9  --  0.8  PROT 6.7  --  6.1*  ALBUMIN 2.6*  --  2.3*  INR  --  1.2  --      INFECTIOUS Recent Labs  Lab 12/18/21 0040 12/18/21 0600 12/19/21 0322  LATICACIDVEN 1.3  --   --   PROCALCITON 0.18 0.13 0.13     ENDOCRINE CBG (last 3)  No results for input(s): "GLUCAP" in the last 72 hours.       IMAGING x48h  - image(s) personally visualized  -   highlighted in bold CT Angio Chest Pulmonary Embolism (PE) W or WO Contrast  Result Date: 12/19/2021 CLINICAL DATA:  Shortness of breath. Evaluate for pulmonary embolism. EXAM: CT ANGIOGRAPHY CHEST WITH CONTRAST TECHNIQUE: Multidetector CT imaging of the chest was performed using the standard protocol during bolus administration of intravenous contrast. Multiplanar CT image reconstructions and MIPs were obtained to evaluate the vascular anatomy. RADIATION DOSE REDUCTION: This exam was performed according to the departmental dose-optimization program which includes automated exposure control, adjustment of the mA and/or kV according to patient size and/or use of iterative reconstruction technique. CONTRAST:  72m OMNIPAQUE IOHEXOL 350 MG/ML SOLN COMPARISON:  Chest radiograph -12/17/2021 FINDINGS: Vascular Findings: There is adequate opacification of the pulmonary arterial system with the main pulmonary artery measuring  221 Hounsfield units. No discrete filling defects are seen within the pulmonary arterial tree to suggest pulmonary embolism. Normal caliber of the main pulmonary artery. Borderline cardiomegaly. Coronary artery calcifications. No pericardial effusion though a small amount of fluid is seen within the pericardial recess. No evidence of thoracic aortic aneurysm or dissection on this nongated examination. Bovine configuration of the aortic arch. Atherosclerotic plaque involving  the aortic arch and descending thoracic aorta, not resulting in a hemodynamically significant stenosis. The descending thoracic aorta is tortuous but of normal caliber. Review of the MIP images confirms the above findings. ---------------------------------------------------------------------------------- Nonvascular Findings: Mediastinum/Lymph Nodes: Borderline enlarged mediastinal and hilar lymph nodes with index precarinal lymph node measuring 1 cm in greatest short axis diameter (image 30, series 4), index prevascular lymph node measuring 0.9 cm (image 72), index right paratracheal lymph node measuring 0.9 cm (image 18), index right infrahilar lymph node measuring 0.9 cm (image 43, series 4 and index left hilar nodal conglomeration measuring 1.0 (image 35) and 0.9 cm (image 40, series 4), nonspecific. No axillary lymphadenopathy. Lungs/Pleura: Extensive bilateral crazy paving pattern as evidenced by extensive ground-glass opacities and interlobular septal thickening, progressed compared to chest radiograph performed 12/17/2021. Minimal subpleural atelectasis involving the anterior aspects of the bilateral upper lobes. No associated air bronchograms. The central pulmonary airways appear widely patent. Trace bilateral pleural effusions, left-greater-than-right. Evaluation for discrete pulmonary nodules is degraded secondary to extensive parenchymal abnormalities. Upper abdomen: Limited early arterial phase evaluation of the upper abdomen  demonstrates an approximately 5.3 x 3.4 cm hypoattenuating lesion involving the central aspect of the right lobe of the liver (image 81, series 4), incompletely evaluated on the present examination though potentially representative of a minimally complex hepatic cyst. Musculoskeletal: No acute or aggressive osseous abnormalities. Incidental note made of a right-sided cervical rib. Moderate DDD of C6-C7 with disc space height loss, endplate irregularity and sclerosis. Regional soft tissues appear normal. Normal appearance of the thyroid gland. IMPRESSION: 1. No evidence of pulmonary embolism. 2. Extensive bilateral crazy paving pattern characterized by extensive bilateral ground-glass opacities and interlobular septal thickening. Differential considerations are broad and include pulmonary edema, diffuse alveolar hemorrhage, ARDS, acute hypersensitivity pneumonitis and (favored) atypical infection (including COVID-19 pneumonia). Clinical correlation is advised. Follow-up chest radiograph in 4-6 weeks is recommended to ensure resolution. 3. Borderline enlarged mediastinal and hilar lymphadenopathy, nonspecific though presumably reactive in etiology. 4. Borderline cardiomegaly. 5. Coronary artery calcifications. Aortic Atherosclerosis (ICD10-I70.0). 6. Indeterminate approximately 5.3 cm hypoattenuating lesion within the central aspect of the right lobe of the liver, potentially representative of a complex hepatic cyst, though incompletely evaluated on the present examination. Comparison with prior outside examinations (if available), is advised. Otherwise, further evaluation with nonemergent abdominal MRI is recommended for further characterization. Electronically Signed   By: Sandi Mariscal M.D.   On: 12/19/2021 16:22

## 2021-12-20 NOTE — Progress Notes (Signed)
RT NOTE:  Pt taken off BiPAP per CCMD order, pt placed back on 13L [place], with no complications noted. Pt saturations 98%, vitals are stable at this time.

## 2021-12-20 NOTE — Progress Notes (Signed)
[name] Progress Note Patient Name: Erin [name] DOB: [birth year] MRN: [id number]   Date of Service  12/20/2021  HPI/Events of Note  Patient is agitated on BIPAP.  eICU Interventions  Zyprexa 2.5 mg IM x 1 ordered.        Frederik Pear 12/20/2021, 8:47 PM

## 2021-12-20 NOTE — Progress Notes (Signed)
  Progress Note   Patient: Erin [name] GHW:[id number] DOB: Mar 30, 1950 DOA: 12/17/2021     3 DOS: the patient was seen and examined on 12/20/2021   Brief hospital course: 71 y.o. female with medical history significant for depression, anxiety, and history of alcohol abuse in remission who presents emergency department for evaluation of cough and shortness of breath.   Patient initially sought care at an urgent care for 2 days of progressive cough and shortness of breath, was found to be hypoxic, and directed to the ED. In ED, pt was found to have xray findings worrisome for multifocal PNA with leukocytosis. Pt was started on abx  Assessment and Plan: 1. Severe sepsis d/t pneumonia; acute hypoxic respiratory failure, present on admission  - Found to have multifocal PNA with fever, leukocytosis, tachypnea, and acute hypoxic respiratory failure   - Started on Rocephin and azithromycin in ED  - Continues to require between 13 to 15L high flow O2, unable to wean further.  -CTA chest on 11/6 without PE, however is notable for extensive B crazy paving pattern with B ground-glass opacities and interlobular septal thickening. -Appreciate assistance by Pulmonary. Workup underway. 2d echo pending -Pulm recommends bronch today   2. HTN  -BP stable at present - Continue clonidine     3. Depression, anxiety  - Continue Wellbutrin, Prozac, Latuda, and hydroxyzine      4. Prolonged QT interval  - QTc is 507 ms in ED  -potassium corrected, avoid QT-prolonging medications   5. Liver lesion -Incidental 5.3cm R lobe lesion on CTA chest -recommend nonemergent abd MRI once above pulmonary issues have been resolved      Subjective: Despite continued high flow O2, pt reports breathing somewhat better today  Physical Exam: Vitals:   12/20/21 1124 12/20/21 1200 12/20/21 1300 12/20/21 1341  BP:   (!) 126/47 (!) 151/54  Pulse: 81 76 70 81  Resp: (!) 31 (!) 36 (!) 29 (!) 43  Temp:  100.3 F  (37.9 C)    TempSrc:  Axillary    SpO2: 98% 91% 95% 93%  Weight:       General exam: Awake, laying in bed, in nad Respiratory system: Increased respiratory effort, no wheezing Cardiovascular system: regular rate, s1, s2 Gastrointestinal system: Soft, nondistended, positive BS Central nervous system: CN2-12 grossly intact, strength intact Extremities: Perfused, no clubbing Skin: Normal skin turgor, no notable skin lesions seen Psychiatry: Mood normal // no visual hallucinations   Data Reviewed:  Labs reviewed: Na 133, K 3.9, Cr 0.67, WBC 12.1, hgb 10.6  Family Communication: Pt in room, family not at bedside  Disposition: Status is: Inpatient Remains inpatient appropriate because: Severity of illness  Planned Discharge Destination: Home    Author: Marylu Lund, MD 12/20/2021 1:57 PM  For on call review [email or web address].

## 2021-12-20 NOTE — Progress Notes (Signed)
  Echocardiogram 2D Echocardiogram has been performed.  Erin [name] 12/20/2021, 12:40 PM

## 2021-12-21 ENCOUNTER — Inpatient Hospital Stay (HOSPITAL_COMMUNITY): Payer: Medicare Other

## 2021-12-21 DIAGNOSIS — S27309D Unspecified injury of lung, unspecified, subsequent encounter: Secondary | ICD-10-CM | POA: Diagnosis not present

## 2021-12-21 DIAGNOSIS — I10 Essential (primary) hypertension: Secondary | ICD-10-CM | POA: Diagnosis not present

## 2021-12-21 DIAGNOSIS — J189 Pneumonia, unspecified organism: Secondary | ICD-10-CM | POA: Diagnosis not present

## 2021-12-21 DIAGNOSIS — R9431 Abnormal electrocardiogram [ECG] [EKG]: Secondary | ICD-10-CM

## 2021-12-21 DIAGNOSIS — J9601 Acute respiratory failure with hypoxia: Secondary | ICD-10-CM | POA: Diagnosis not present

## 2021-12-21 DIAGNOSIS — F32A Depression, unspecified: Secondary | ICD-10-CM

## 2021-12-21 LAB — CBC
HCT: 33.5 % — ABNORMAL LOW (ref 36.0–46.0)
Hemoglobin: 10.9 g/dL — ABNORMAL LOW (ref 12.0–15.0)
MCH: 28.8 pg (ref 26.0–34.0)
MCHC: 32.5 g/dL (ref 30.0–36.0)
MCV: 88.4 fL (ref 80.0–100.0)
Platelets: 442 10*3/uL — ABNORMAL HIGH (ref 150–400)
RBC: 3.79 MIL/uL — ABNORMAL LOW (ref 3.87–5.11)
RDW: 13.7 % (ref 11.5–15.5)
WBC: 8.7 10*3/uL (ref 4.0–10.5)
nRBC: 0 % (ref 0.0–0.2)

## 2021-12-21 LAB — COMPREHENSIVE METABOLIC PANEL
ALT: 33 U/L (ref 0–44)
AST: 35 U/L (ref 15–41)
Albumin: 2.4 g/dL — ABNORMAL LOW (ref 3.5–5.0)
Alkaline Phosphatase: 201 U/L — ABNORMAL HIGH (ref 38–126)
Anion gap: 9 (ref 5–15)
BUN: 19 mg/dL (ref 8–23)
CO2: 28 mmol/L (ref 22–32)
Calcium: 9.4 mg/dL (ref 8.9–10.3)
Chloride: 98 mmol/L (ref 98–111)
Creatinine, Ser: 0.64 mg/dL (ref 0.44–1.00)
GFR, Estimated: >60 mL/min (ref >60)
Glucose, Bld: 157 mg/dL — ABNORMAL HIGH (ref 70–99)
Potassium: 4.4 mmol/L (ref 3.5–5.1)
Sodium: 135 mmol/L (ref 135–145)
Total Bilirubin: 0.6 mg/dL (ref 0.3–1.2)
Total Protein: 6.5 g/dL (ref 6.5–8.1)

## 2021-12-21 LAB — GLUCOSE, CAPILLARY
Glucose-Capillary: 149 mg/dL — ABNORMAL HIGH (ref 70–99)
Glucose-Capillary: 149 mg/dL — ABNORMAL HIGH (ref 70–99)
Glucose-Capillary: 162 mg/dL — ABNORMAL HIGH (ref 70–99)
Glucose-Capillary: 187 mg/dL — ABNORMAL HIGH (ref 70–99)

## 2021-12-21 LAB — BODY FLUID CELL COUNT WITH DIFFERENTIAL
Eos, Fluid: 11 %
Lymphs, Fluid: 2 %
Monocyte-Macrophage-Serous Fluid: 1 % — ABNORMAL LOW (ref 50–90)
Neutrophil Count, Fluid: 86 % — ABNORMAL HIGH (ref 0–25)

## 2021-12-21 LAB — SAR COV2 SEROLOGY (COVID19)AB(IGG),IA: SARS-CoV-2 Ab, IgG: REACTIVE — AB

## 2021-12-21 MED ORDER — OLANZAPINE 5 MG PO TBDP
5.0000 mg | ORAL_TABLET | Freq: Every day | ORAL | Status: DC | PRN
  Administered 2021-12-21: 5 mg via ORAL
  Filled 2021-12-21: qty 1

## 2021-12-21 NOTE — Progress Notes (Signed)
PROGRESS NOTE    Erin [name]  DUK:[id number] DOB: [birth year] DOA: 12/17/2021 PCP: Aura Dials, PA-C    Chief Complaint  Patient presents with   Shortness of Breath    Brief Narrative:  71 y.o. female with medical history significant for depression, anxiety, and history of alcohol abuse in remission who presents emergency department for evaluation of cough and shortness of breath.   Patient initially sought care at an urgent care for 2 days of progressive cough and shortness of breath, was found to be hypoxic, and directed to the ED. In ED, pt was found to have xray findings worrisome for multifocal PNA with leukocytosis. Pt was started on abx. -CT angiogram chest done showed diffuse groundglass opacities raising concern for multiple differential diagnosis. -Due to worsening respiratory status and increased O2 requirements PCCM consulted. -Patient seen in consultation by PCCM and subsequently underwent bronchoscopy with BAL.  Patient due to concern for acute lung injury placed on IV Solu-Medrol with clinical improvement.   Assessment & Plan:   Principal Problem:   Sepsis due to pneumonia [facility]) Active Problems:   Acute respiratory failure with hypoxia (HCC)   Acute lung injury   Multifocal pneumonia   Depression   Hypokalemia   Essential hypertension   Prolonged QT interval   QT prolongation   Hypertension  #1 acute hypoxemic respiratory failure, POA secondary to ALI/ pneumonia -Patient noted to have presented with 2-day history of progressive cough, shortness of breath, noted to be hypoxemic. -CT angiogram negative for PE but did show diffuse groundglass opacities raising concern for a broad list of differential diagnosis. -Due to increased O2 requirements going up as high as 15 L high flow nasal cannula and required BiPAP PCCM consulted. 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 -Patient given a dose of IV Lasix yesterday with urine output not properly recorded -Patient started on high-dose IV Solu-Medrol 125 mg every 6 hours with clinical improvement with O2 sats down to 8 L high flow nasal cannula today from BiPAP overnight. -Serology ordered per PCCM including vasculitis panel, QuantiFERON gold, urine Legionella antigen, urine strep pneumococcus antigen. -Doxycycline discontinued per PCCM and patient maintained on IV Rocephin pending urine strep Legionella antigen. -PCCM recommending BiPAP nightly and daytime 1-2 applications. -Per PCCM May DC daily BiPAP once down to 6 L and continue BiPAP nightly and may DC nightly BiPAP once less than 4 L O2 requirements. -PCCM recommending IV Solu-Medrol through 12/21/2021 and then to start oral prednisone taper 12/23/2021 and taper over a few weeks to few months. -We will need outpatient follow-up with PCCM.  2.  Severe sepsis due to pneumonia -Patient on admission met criteria for severe sepsis with multifocal pneumonia concerns, fever, leukocytosis, tachypnea, acute hypoxic respiratory failure and placed empirically on IV Rocephin and azithromycin in the ED. -Due to increased O2 requirement CT angiogram chest on 12/19/2021 negative for PE however notable for extensive be crazy paving pattern with be groundglass opacities and interlobular septal thickening. -2D echo done EF 60 to 65%,NWMA. -See problem #1 above.  3.  Hypertension -BP soft. -On clonidine nightly, may need to hold clonidine however will monitor for now.  4.  Depression/anxiety -Continue home regimen Prozac, Wellbutrin, Latuda, hydroxyzine.  5.  Prolonged QT interval -QTc noted at 507 on presentation in the ED. -Repeat EKG with RBBB with no significant change from prior EKG, some improvement with  QTc. -Electrolytes repleted. -Avoid QTc prolongation medications.  6.  Liver lesion -Incidental  5.3 cm right lobe lesion on CTA of chest noted. -Recommend nonemergent abdominal MRI once pulmonary issues have resolved which may be done in the outpatient setting.  DVT prophylaxis: Lovenox Code Status: Full Family Communication: Updated patient.  Updated sisters at bedside.   Disposition: TBD  Status is: Inpatient Remains inpatient appropriate because: Severity of illness   Consultants:  PCCM: Dr. Chase Caller 12/19/2021  Procedures:  Flexible bronchoscopy, BAL of the lingula per PCCM, Dr. Chase Caller 12/20/2021 CT angiogram chest 12/19/2021 Chest x-ray 12/17/2021, 12/20/2021, 12/21/2021 2D echo 12/20/2021  Significant Hospital Events:  12/17/2021 - admit 12/19/21 - ccm consult             - LDH 450             - CRP 25/ ESR 111             - BNP 107             - EOS 1200 (amdit 600)             - Quant GOld >>             - RVP 12/19/21 - NEG             - VASCULITIS PANEL >>             - SEROLOGY panel >>             - Urine strep/leg >>             - covid IgG - POSITIVE (2 year grand kid had covid 2 mo ago)   12/20/21 Bronch BAL              -11% eos, 86% PMN NO ALV hge             - repeat RVP neg             - stARted solumedrol 156m Q6h               - 13L HFNC. Using BiPAP QHs and day time 1-2 times  Antimicrobials:  Oral Azithromycin 12/17/2021 x 1 dose IV Rocephin 12/17/2021>>>> 12/22/2021   Subjective: Patient sitting up in bed on 8 L high flow nasal cannula with sats of 89%.  Was on BiPAP overnight.  States feels a little bit better.  Still with a nonproductive cough.  Sisters at bedside.  Objective: Vitals:   12/21/21 0600 12/21/21 0645 12/21/21 1142 12/21/21 1146  BP: (!) 117/46     Pulse: (!) 58  78   Resp: (!) 22  (!) 25   Temp:  (!) 96.9 F (36.1 C)    TempSrc:  Axillary    SpO2: 97%  97% 96%  Weight:        Intake/Output Summary (Last 24 hours) at 12/21/2021 1520 Last data filed at 12/21/2021 0400 Gross per 24 hour  Intake 152.56 ml  Output 100 ml   Net 52.56 ml   Filed Weights   12/17/21 1657  Weight: 91.6 kg    Examination:  General exam: NAD Respiratory system: Some coarse breath sounds on the left base.  No wheezing.  Fair air movement.  Cardiovascular system: Regular rate rhythm no murmurs rubs or gallops.  No JVD.  No lower extremity edema.  Gastrointestinal system: Abdomen is nondistended, soft and nontender. No organomegaly or masses felt. Normal bowel sounds heard. Central nervous system:  Alert and oriented. No focal neurological deficits. Extremities: Symmetric 5 x 5 power. Skin: No rashes, lesions or ulcers Psychiatry: Judgement and insight appear normal. Mood & affect appropriate.     Data Reviewed: I have personally reviewed following labs and imaging studies  CBC: Recent Labs  Lab 12/17/21 1850 12/18/21 0600 12/19/21 0322 12/19/21 1855 12/20/21 0333 12/21/21 0327  WBC 12.8* 11.0* 11.9* 13.6* 12.1* 8.7  NEUTROABS 9.5*  --   --  10.3*  --   --   HGB 11.2* 10.3* 10.6* 11.1* 10.6* 10.9*  HCT 33.0* 30.8* 31.7* 34.0* 32.1* 33.5*  MCV 87.5 88.0 87.1 88.5 87.7 88.4  PLT 409* 392 399 429* 389 442*    Basic Metabolic Panel: Recent Labs  Lab 12/17/21 1850 12/18/21 0040 12/18/21 0600 12/19/21 0322 12/20/21 0333 12/21/21 0327  NA 136  --  132* 132* 133* 135  K 3.3*  --  3.8 4.0 3.9 4.4  CL 100  --  100 100 97* 98  CO2 26  --  _0 GLUCOSE 113*  --  106* 108* 113* 157*  BUN 12  --  _1 CREATININE 0.79  --  0.58 0.57 0.67 0.64  CALCIUM 9.0  --  8.6* 8.7* 8.9 9.4  MG  --  1.9  --   --  1.9  --   PHOS  --   --   --   --  4.1  --     GFR: CrCl cannot be calculated (Unknown ideal weight.).  Liver Function Tests: Recent Labs  Lab 12/17/21 1850 12/18/21 0600 12/21/21 0327  AST 37 37 35  ALT 36 33 33  ALKPHOS 209* 202* 201*  BILITOT 0.9 0.8 0.6  PROT 6.7 6.1* 6.5  ALBUMIN 2.6* 2.3* 2.4*    CBG: Recent Labs  Lab 12/20/21 2050 12/20/21 2256 12/21/21 0253 12/21/21 0801  12/21/21 1152  GLUCAP 147* 154* 149* 149* 162*     Recent Results (from the past 240 hour(s))  Resp Panel by RT-PCR (Flu A&B, Covid) Anterior Nasal Swab     Status: None   Collection Time: 12/17/21  6:24 PM   Specimen: Anterior Nasal Swab  Result Value Ref Range Status   SARS Coronavirus 2 by RT PCR NEGATIVE NEGATIVE Final    Comment: (NOTE) SARS-CoV-2 target nucleic acids are NOT DETECTED.  The SARS-CoV-2 RNA is generally detectable in upper respiratory specimens during the acute phase of infection. The lowest concentration of SARS-CoV-2 viral copies this assay can detect is 138 copies/mL. A negative result does not preclude SARS-Cov-2 infection and should not be used as the sole basis for treatment or other patient management decisions. A negative result may occur with  improper specimen collection/handling, submission of specimen other than nasopharyngeal swab, presence of viral mutation(s) within the areas targeted by this assay, and inadequate number of viral copies(<138 copies/mL). A negative result must be combined with clinical observations, patient history, and epidemiological information. The expected result is Negative.  Fact Sheet for Patients:  EntrepreneurPulse.com.au  Fact Sheet for Healthcare Providers:  IncredibleEmployment.be  This test is no t yet approved or cleared by the [place] FDA and  has been authorized for detection and/or diagnosis of SARS-CoV-2 by FDA under an Emergency Use Authorization (EUA). This EUA will remain  in effect (meaning this test can be used) for the duration of the COVID-19 declaration under Section 564(b)(1) of the Act, 21 U.S.C.section 360bbb-3(b)(1), unless the authorization is terminated  or  revoked sooner.       Influenza A by PCR NEGATIVE NEGATIVE Final   Influenza B by PCR NEGATIVE NEGATIVE Final    Comment: (NOTE) The Xpert Xpress SARS-CoV-2/FLU/RSV plus assay is intended as an  aid in the diagnosis of influenza from Nasopharyngeal swab specimens and should not be used as a sole basis for treatment. Nasal washings and aspirates are unacceptable for Xpert Xpress SARS-CoV-2/FLU/RSV testing.  Fact Sheet for Patients: EntrepreneurPulse.com.au  Fact Sheet for Healthcare Providers: IncredibleEmployment.be  This test is not yet approved or cleared by the [place] FDA and has been authorized for detection and/or diagnosis of SARS-CoV-2 by FDA under an Emergency Use Authorization (EUA). This EUA will remain in effect (meaning this test can be used) for the duration of the COVID-19 declaration under Section 564(b)(1) of the Act, 21 U.S.C. section 360bbb-3(b)(1), unless the authorization is terminated or revoked.  Performed at [facility], [place] [address], [place], [place] [address]   Culture, blood (x 2)     Status: None (Preliminary result)   Collection Time: 12/18/21 12:40 AM   Specimen: BLOOD  Result Value Ref Range Status   Specimen Description   Final    BLOOD BLOOD RIGHT FOREARM Performed at [facility] [address], [place], [place] [address]    Special Requests   Final    BOTTLES DRAWN AEROBIC AND ANAEROBIC Blood Culture adequate volume Performed at [facility] [address], [place], [place] [address]    Culture   Final    NO GROWTH 3 DAYS Performed at [facility], [place] [address] [place], [place] [address]    Report Status PENDING  Incomplete  Culture, blood (x 2)     Status: None (Preliminary result)   Collection Time: 12/18/21 12:40 AM   Specimen: BLOOD  Result Value Ref Range Status   Specimen Description   Final    BLOOD BLOOD RIGHT HAND Performed at [name] [address], [place], [place] [address]    Special Requests   Final    BOTTLES DRAWN AEROBIC AND ANAEROBIC Blood Culture adequate  volume Performed at [facility] [address], [place], [place] [address]    Culture   Final    NO GROWTH 3 DAYS Performed at [facility], [place] [address], [place], [place] [address]    Report Status PENDING  Incomplete  MRSA Next Gen by PCR, Nasal     Status: None   Collection Time: 12/18/21  2:20 PM   Specimen: Nasal Mucosa; Nasal Swab  Result Value Ref Range Status   MRSA by PCR Next Gen NOT DETECTED NOT DETECTED Final    Comment: (NOTE) The GeneXpert MRSA Assay (FDA approved for NASAL specimens only), is one component of a comprehensive MRSA colonization surveillance program. It is not intended to diagnose MRSA infection nor to guide or monitor treatment for MRSA infections. Test performance is not FDA approved in patients less than 58 years old. Performed at [facility], [place] [address] [place], [place] [address]   Respiratory (~20 pathogens) panel by PCR     Status: None   Collection Time: 12/19/21  6:30 PM   Specimen: Nasopharyngeal Swab; Respiratory  Result Value Ref Range Status   Adenovirus NOT DETECTED NOT DETECTED Final   Coronavirus 229E NOT DETECTED NOT DETECTED Final    Comment: (NOTE) The Coronavirus on the Respiratory Panel, DOES NOT test for the  novel  Coronavirus (2019 nCoV)    Coronavirus HKU1 NOT DETECTED NOT DETECTED Final   Coronavirus NL63 NOT DETECTED NOT DETECTED Final   Coronavirus OC43 NOT DETECTED NOT DETECTED Final   Metapneumovirus NOT DETECTED NOT DETECTED Final   Rhinovirus / Enterovirus NOT DETECTED NOT DETECTED Final   Influenza A NOT DETECTED NOT DETECTED Final   Influenza B NOT DETECTED NOT DETECTED Final   Parainfluenza Virus 1 NOT DETECTED NOT DETECTED Final   Parainfluenza Virus 2 NOT DETECTED NOT DETECTED Final   Parainfluenza Virus 3 NOT DETECTED NOT DETECTED Final   Parainfluenza Virus 4 NOT DETECTED NOT DETECTED Final   Respiratory Syncytial Virus NOT DETECTED NOT DETECTED Final    Bordetella pertussis NOT DETECTED NOT DETECTED Final   Bordetella Parapertussis NOT DETECTED NOT DETECTED Final   Chlamydophila pneumoniae NOT DETECTED NOT DETECTED Final   Mycoplasma pneumoniae NOT DETECTED NOT DETECTED Final    Comment: Performed at [facility], [place] [address], [place], [place] [address]  Respiratory (~20 pathogens) panel by PCR     Status: None   Collection Time: 12/20/21  4:26 PM   Specimen: Bronchoalveolar Lavage; Respiratory  Result Value Ref Range Status   Adenovirus NOT DETECTED NOT DETECTED Final   Coronavirus 229E NOT DETECTED NOT DETECTED Final    Comment: (NOTE) The Coronavirus on the Respiratory Panel, DOES NOT test for the novel  Coronavirus (2019 nCoV)    Coronavirus HKU1 NOT DETECTED NOT DETECTED Final   Coronavirus NL63 NOT DETECTED NOT DETECTED Final   Coronavirus OC43 NOT DETECTED NOT DETECTED Final   Metapneumovirus NOT DETECTED NOT DETECTED Final   Rhinovirus / Enterovirus NOT DETECTED NOT DETECTED Final   Influenza A NOT DETECTED NOT DETECTED Final   Influenza B NOT DETECTED NOT DETECTED Final   Parainfluenza Virus 1 NOT DETECTED NOT DETECTED Final   Parainfluenza Virus 2 NOT DETECTED NOT DETECTED Final   Parainfluenza Virus 3 NOT DETECTED NOT DETECTED Final   Parainfluenza Virus 4 NOT DETECTED NOT DETECTED Final   Respiratory Syncytial Virus NOT DETECTED NOT DETECTED Final   Bordetella pertussis NOT DETECTED NOT DETECTED Final   Bordetella Parapertussis NOT DETECTED NOT DETECTED Final   Chlamydophila pneumoniae NOT DETECTED NOT DETECTED Final   Mycoplasma pneumoniae NOT DETECTED NOT DETECTED Final    Comment: Performed at [facility], [place]. [address] [place], [place] [address]  Culture, Respiratory w Gram Stain     Status: None (Preliminary result)   Collection Time: 12/20/21  4:26 PM   Specimen: Bronchoalveolar Lavage; Respiratory  Result Value Ref Range Status   Specimen Description   Final    BRONCHIAL ALVEOLAR  LAVAGE Performed at [facility] [address] [place], [place] [address]    Special Requests   Final    NONE Performed at [facility], [place] [address] [place], [place] [address]    Gram Stain NO WBC SEEN NO ORGANISMS SEEN   Final   Culture   Final    NO GROWTH < 24 HOURS Performed at [facility], [place] [address], [place], [place] [address]    Report Status PENDING  Incomplete         Radiology Studies: DG CHEST PORT 1 VIEW  Result Date: 12/21/2021 CLINICAL DATA:  Acute on chronic respiratory failure EXAM: PORTABLE CHEST 1 VIEW COMPARISON:  Chest radiograph dated 12/20/2021, CTA chest dated 12/19/2021 FINDINGS: Improved lung aeration. Decreased but persistent patchy and interstitial opacities throughout  both lungs. Decreased areas of more confluent opacities in the left greater than right lung. No definite pleural effusion or pneumothorax. The heart size and mediastinal contours are within normal limits. The visualized skeletal structures are unremarkable. IMPRESSION: Improved lung aeration with decreased but persistent patchy and interstitial opacities throughout both lungs. Electronically Signed   By: Darrin Nipper M.D.   On: 12/21/2021 10:20   DG CHEST PORT 1 VIEW  Result Date: 12/20/2021 CLINICAL DATA:  Respiratory distress EXAM: PORTABLE CHEST 1 VIEW COMPARISON:  Chest x-ray dated December 17, 2021 FINDINGS: The heart size and mediastinal contours are within normal limits. Diffuse heterogeneous opacities, increased when compared with prior chest x-ray. The visualized skeletal structures are unremarkable. IMPRESSION: Diffuse heterogeneous opacities, worsened when compared with prior radiograph, likely due to pulmonary edema. Electronically Signed   By: Yetta Glassman M.D.   On: 12/20/2021 16:48   ECHOCARDIOGRAM COMPLETE  Result Date: 12/20/2021    ECHOCARDIOGRAM REPORT   Patient Name:   Erin [name] Date of Exam: 12/20/2021  Medical Rec #:  [id number]              Height:       69.0 in Accession #:    [id number]             Weight:       202.0 lb Date of Birth:  05/22/1950              BSA:          2.075 m Patient Age:    56 years               BP:           141/61 mmHg Patient Gender: F                      HR:           77 bpm. Exam Location:  Inpatient Procedure: 2D Echo and Intracardiac Opacification Agent Indications:    CHF  History:        Patient has no prior history of Echocardiogram examinations.  Sonographer:    Harvie Junior Referring Phys: [phone] STEPHEN K CHIU  Sonographer Comments: Technically difficult study due to poor echo windows. IMPRESSIONS  1. Left ventricular ejection fraction, by estimation, is 60 to 65%. The left ventricle has normal function. The left ventricle has no regional wall motion abnormalities. Left ventricular diastolic parameters were normal.  2. Right ventricular systolic function is normal. The right ventricular size is normal. There is normal pulmonary artery systolic pressure.  3. The mitral valve is normal in structure. No evidence of mitral valve regurgitation. No evidence of mitral stenosis.  4. The aortic valve is normal in structure. Aortic valve regurgitation is not visualized. No aortic stenosis is present.  5. The inferior vena cava is normal in size with greater than 50% respiratory variability, suggesting right atrial pressure of 3 mmHg. FINDINGS  Left Ventricle: Left ventricular ejection fraction, by estimation, is 60 to 65%. The left ventricle has normal function. The left ventricle has no regional wall motion abnormalities. Definity contrast agent was given IV to delineate the left ventricular  endocardial borders. The left ventricular internal cavity size was normal in size. There is no left ventricular hypertrophy. Left ventricular diastolic parameters were normal. Normal left ventricular filling pressure. Right Ventricle: The right ventricular size is normal. No increase in right  ventricular wall thickness. Right ventricular systolic function is normal.  There is normal pulmonary artery systolic pressure. The tricuspid regurgitant velocity is 2.44 m/s, and  with an assumed right atrial pressure of 3 mmHg, the estimated right ventricular systolic pressure is 32.9 mmHg. Left Atrium: Left atrial size was normal in size. Right Atrium: Right atrial size was normal in size. Pericardium: There is no evidence of pericardial effusion. Mitral Valve: The mitral valve is normal in structure. No evidence of mitral valve regurgitation. No evidence of mitral valve stenosis. Tricuspid Valve: The tricuspid valve is normal in structure. Tricuspid valve regurgitation is not demonstrated. No evidence of tricuspid stenosis. Aortic Valve: The aortic valve is normal in structure. Aortic valve regurgitation is not visualized. No aortic stenosis is present. Aortic valve mean gradient measures 3.0 mmHg. Aortic valve peak gradient measures 5.4 mmHg. Aortic valve area, by VTI measures 3.56 cm. Pulmonic Valve: The pulmonic valve was normal in structure. Pulmonic valve regurgitation is not visualized. No evidence of pulmonic stenosis. Aorta: The aortic root is normal in size and structure. Venous: The inferior vena cava is normal in size with greater than 50% respiratory variability, suggesting right atrial pressure of 3 mmHg. IAS/Shunts: No atrial level shunt detected by color flow Doppler.  LEFT VENTRICLE PLAX 2D LVIDd:         4.70 cm      Diastology LVIDs:         3.20 cm      LV e' medial:    12.10 cm/s LV PW:         1.00 cm      LV E/e' medial:  8.2 LV IVS:        1.00 cm      LV e' lateral:   11.30 cm/s LVOT diam:     2.00 cm      LV E/e' lateral: 8.8 LV SV:         75 LV SV Index:   36 LVOT Area:     3.14 cm  LV Volumes (MOD) LV vol d, MOD A2C: 112.0 ml LV vol d, MOD A4C: 139.0 ml LV vol s, MOD A2C: 39.3 ml LV vol s, MOD A4C: 44.4 ml LV SV MOD A2C:     72.7 ml LV SV MOD A4C:     139.0 ml LV SV MOD BP:      87.7  ml RIGHT VENTRICLE RV Basal diam:  3.30 cm RV Mid diam:    3.20 cm TAPSE (M-mode): 2.2 cm LEFT ATRIUM           Index        RIGHT ATRIUM           Index LA diam:      3.50 cm 1.69 cm/m   RA Area:     13.40 cm LA Vol (A2C): 29.5 ml 14.22 ml/m  RA Volume:   29.60 ml  14.27 ml/m LA Vol (A4C): 40.0 ml 19.28 ml/m  AORTIC VALVE                    PULMONIC VALVE AV Area (Vmax):    3.47 cm     PV Vmax:       1.28 m/s AV Area (Vmean):   3.42 cm     PV Peak grad:  6.6 mmHg AV Area (VTI):     3.56 cm AV Vmax:           116.00 cm/s AV Vmean:          83.200 cm/s AV  VTI:            0.210 m AV Peak Grad:      5.4 mmHg AV Mean Grad:      3.0 mmHg LVOT Vmax:         128.00 cm/s LVOT Vmean:        90.700 cm/s LVOT VTI:          0.238 m LVOT/AV VTI ratio: 1.13  AORTA Ao Root diam: 3.10 cm Ao Asc diam:  3.20 cm MITRAL VALVE               TRICUSPID VALVE MV Area (PHT): 3.53 cm    TR Peak grad:   23.8 mmHg MV Decel Time: 215 msec    TR Vmax:        244.00 cm/s MR Peak grad: 23.9 mmHg MR Vmax:      244.50 cm/s  SHUNTS MV E velocity: 99.40 cm/s  Systemic VTI:  0.24 m MV A velocity: 78.40 cm/s  Systemic Diam: 2.00 cm MV E/A ratio:  1.27 Mihai Croitoru MD Electronically signed by Sanda Klein MD Signature Date/Time: 12/20/2021/1:06:55 PM    Final         Scheduled Meds:  atorvastatin  20 mg Oral QPM   buPROPion  150 mg Oral q morning   Chlorhexidine Gluconate Cloth  6 each Topical Daily   cloNIDine  0.1 mg Oral QHS   enoxaparin (LOVENOX) injection  40 mg Subcutaneous Q24H   FLUoxetine  40 mg Oral Daily   hydrOXYzine  25 mg Oral Daily   insulin aspart  0-15 Units Subcutaneous Q4H   lurasidone  40 mg Oral QHS   methylPREDNISolone (SOLU-MEDROL) injection  125 mg Intravenous Q6H   nicotine  21 mg Transdermal Daily   mouth rinse  15 mL Mouth Rinse 4 times per day   sodium chloride flush  3 mL Intravenous Q12H   Continuous Infusions:  cefTRIAXone (ROCEPHIN)  IV Stopped (12/20/21 2141)     LOS: 4 days    Time  spent: 40 minutes    Irine Seal, MD [facility]   To contact the attending provider between 7A-7P or the covering provider during after hours 7P-7A, please log into the web site [email or web address] and access using universal [place] password for that web site. If you do not have the password, please call the hospital operator.  12/21/2021, 3:20 PM

## 2021-12-21 NOTE — Progress Notes (Signed)
NAME:  Erin [name], MRN:  [id number], DOB:  [birth year], LOS: 4 ADMISSION DATE:  12/17/2021, CONSULTATION DATE:  12/19/21 REFERRING MD:  Dr Wyline Copas of [facility] CHIEF COMPLAINT:  Acute hypoxemic resp failure,    BRIEF   71 year old "big time" smoker who is retired.  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 Suffers from depression, anxiety, previous history of alcohol abuse in remission currently on December 12, 2021 Monday her 39-year-old granddaughter got diagnosed with her first ever RSV infection (also had covid a month ago from day care).  She did have substantial exposure to this granddaughter.  Then on 12/15/2021 Thursday patient became ill abruptly and presented to the ER on 12/17/2021 with 2-day history of progressive cough and shortness of breath.  Found to be hypoxemic.  CT angiogram today ruled out pulmonary embolism but showed diffuse groundglass opacities raising concern for a broad list of differential diagnosis.  She is on antibacterial community-acquired pneumonia antibiotics with Rocephin and azithromycin but without improvement.  Requiring 15 L high flow nasal cannula and subjectively feeling okay and watching TV but is actually tachypneic.  She was febrile 101 Fahrenheit with a high white count consistent with SIRS physiology.  Concern for bacterial pneumonia/sepsis.  Given the presence of infiltrates and hypoxemia  Her blood eosinophils were high at 900 cells per cubic millimeter at admission.  Previously normal.  No prior history of asthma.  No mold exposure in the house.  No feather pillow or feather blankets in the house.  No pet birds in the house. Noted per daughte 12/20/21" old home . There could be mold. Dog in home urinates all over   Critical care medicine called for consultation.    Past Medical History:    has a past medical history of Alcohol abuse (11/08/2016), Alcoholism ([place]), Depression, and Hyperlipemia.   reports that she has been smoking cigarettes. She has been  smoking an average of 1 pack per day. She has never used smokeless tobacco.   Significant Hospital Events:  12/17/2021 - admit 12/19/21 - ccm consult  - LDH 450  - CRP 25/ ESR 111  - BNP 107  - EOS 1200 (amdit 600)  - Quant GOld >>  - RVP 12/19/21 - NEG  - VASCULITIS PANEL >>  - SEROLOGY panel >>  - Urine strep/leg >>  - covid IgG - POSITIVE (2 year grand kid had covid 2 mo ago)  12/20/21 Bronch BAL   -11% eos, 86% PMN NO ALV hge  - repeat RVP neg  - stARted solumedrol 162m Q6h   - 13L HFNC. Using BiPAP QHs and day time 1-2 times  Interim History / Subjective:   12/21/2021 - Used bipap all night. This am down to 8L [place] and hungry. Afebrile. Feeling better  Objective   Blood pressure (!) 117/46, pulse (!) 58, temperature (!) 96.9 F (36.1 C), temperature source Axillary, resp. rate (!) 22, weight 91.6 kg, SpO2 97 %.        Intake/Output Summary (Last 24 hours) at 12/21/2021 0932 Last data filed at 12/21/2021 0400 Gross per 24 hour  Intake 152.56 ml  Output 100 ml  Net 52.56 ml   Filed Weights   12/17/21 1657  Weight: 91.6 kg    Examination: General: No distress. Looks well. Wathign TV.  Neuro: Alert and Oriented x 3. GCS 15. Speech normal Psych: Pleasant Resp:  Barrel Chest - no.  Wheeze - no, Crackles - no. On 8L [place], No overt respiratory distress CVS: Normal heart  sounds. Murmurs - no Ext: Stigmata of Connective Tissue Disease - no HEENT: Normal upper airway. PEERL +. No post nasal drip   Resolved Hospital Problem list   X  Assessment & Plan:   Acute hypoxemic respiratory failure with pulmonary infiltrates  - BAL 11/7 Features c/w ALI - AIP with >80% PMN with AEP (11% eos) 2nd in ddx.  - RVP x 2 neg including from BAL   12/21/2021 -> rapidly improving. Downt to 8L [place]. On Soluemdrol 126m Q6h since 12/20/21   P:   Await  serology Await vasculitis panel Await QuantiFERON gold Urine strep, urine Legionella awaited - ordered 12/17/21 - d/w RN to send it off  if not sent so far  DC isolation  Continue BiPAP nightly  and day time 1-2 application  - dc day bippa once down to 6L   - dc all bipap once <= 4L   DC doxy Continue ceftriaxone but dc once  urine strep leg neg  Continue solemdrol through 12/21/21 PM at high dose and then start po prednisone 12/23/21 and taper over few weeks to few months based on clinical judgement   OPD followup Dr RKarl Pockpractice (daily eval):  Diet: restart diet  Rest per [facility] MD   Goals of Care:    Family Updates: updated patient . [facility]  Can move to progressive 12/21/21     SIGNATURE    Dr. MBrand Males M.D., F.C.C.P,  Pulmonary and Critical Care Medicine Staff Physician, CWainwrightDirector - Interstitial Lung Disease  Program  Pulmonary [place] [id number] NPI Number:  NPI ##[id number] Pager: [phone] If no answer  -> Check AMION or Try [phone] Telephone (clinical office): [phone] Telephone (research): [phone]  9:32 AM 12/21/2021   12/21/2021 9:32 AM    LABS    Latest Reference Range & Units 07/14/09 20:23 09/06/09 13:50 05/16/20 17:51 12/17/21 18:50  Eosinophils Absolute 0.0 - 0.5 K/uL 0.0 0.0 0.0 0.9 (H)  (H): Data is abnormally high PULMONARY Recent Labs  Lab 12/19/21 1843  PHART 7.52*  PCO2ART 35  PO2ART 67*  HCO3 28.6*  O2SAT 95.8    CBC Recent Labs  Lab 12/19/21 1855 12/20/21 0333 12/21/21 0327  HGB 11.1* 10.6* 10.9*  HCT 34.0* 32.1* 33.5*  WBC 13.6* 12.1* 8.7  PLT 429* 389 442*    COAGULATION Recent Labs  Lab 12/18/21 0040  INR 1.2    CARDIAC  No results for input(s): "TROPONINI" in the last 168 hours. No results for input(s): "PROBNP" in the last 168 hours.  CHEMISTRY Recent Labs  Lab 12/17/21 1850 12/18/21 0040 12/18/21 0600 12/19/21 0322 12/20/21 0333 12/21/21 0327  NA 136  --  132* 132*  133* 135  K 3.3*  --  3.8 4.0 3.9 4.4  CL 100  --  100 100 97* 98  CO2 26  --  _0 GLUCOSE 113*  --  106* 108* 113* 157*  BUN 12  --  _1 CREATININE 0.79  --  0.58 0.57 0.67 0.64  CALCIUM 9.0  --  8.6* 8.7* 8.9 9.4  MG  --  1.9  --   --  1.9  --   PHOS  --   --   --   --  4.1  --    CrCl cannot  be calculated (Unknown ideal weight.).   LIVER Recent Labs  Lab 12/17/21 1850 12/18/21 0040 12/18/21 0600 12/21/21 0327  AST 37  --  37 35  ALT 36  --  33 33  ALKPHOS 209*  --  202* 201*  BILITOT 0.9  --  0.8 0.6  PROT 6.7  --  6.1* 6.5  ALBUMIN 2.6*  --  2.3* 2.4*  INR  --  1.2  --   --      INFECTIOUS Recent Labs  Lab 12/18/21 0040 12/18/21 0600 12/19/21 0322  LATICACIDVEN 1.3  --   --   PROCALCITON 0.18 0.13 0.13     ENDOCRINE CBG (last 3)  Recent Labs    12/20/21 2050 12/20/21 2256 12/21/21 0801  GLUCAP 147* 154* 149*         IMAGING x48h  - image(s) personally visualized  -   highlighted in bold DG CHEST PORT 1 VIEW  Result Date: 12/20/2021 CLINICAL DATA:  Respiratory distress EXAM: PORTABLE CHEST 1 VIEW COMPARISON:  Chest x-ray dated December 17, 2021 FINDINGS: The heart size and mediastinal contours are within normal limits. Diffuse heterogeneous opacities, increased when compared with prior chest x-ray. The visualized skeletal structures are unremarkable. IMPRESSION: Diffuse heterogeneous opacities, worsened when compared with prior radiograph, likely due to pulmonary edema. Electronically Signed   By: Yetta Glassman M.D.   On: 12/20/2021 16:48   ECHOCARDIOGRAM COMPLETE  Result Date: 12/20/2021    ECHOCARDIOGRAM REPORT   Patient Name:   Erin [name] Date of Exam: 12/20/2021 Medical Rec #:  [id number]              Height:       69.0 in Accession #:    [id number]             Weight:       202.0 lb Date of Birth:  [birth year]              BSA:          2.075 m Patient Age:    76 years               BP:           141/61 mmHg Patient  Gender: F                      HR:           77 bpm. Exam Location:  Inpatient Procedure: 2D Echo and Intracardiac Opacification Agent Indications:    CHF  History:        Patient has no prior history of Echocardiogram examinations.  Sonographer:    Harvie Junior Referring Phys: [id number] STEPHEN K CHIU  Sonographer Comments: Technically difficult study due to poor echo windows. IMPRESSIONS  1. Left ventricular ejection fraction, by estimation, is 60 to 65%. The left ventricle has normal function. The left ventricle has no regional wall motion abnormalities. Left ventricular diastolic parameters were normal.  2. Right ventricular systolic function is normal. The right ventricular size is normal. There is normal pulmonary artery systolic pressure.  3. The mitral valve is normal in structure. No evidence of mitral valve regurgitation. No evidence of mitral stenosis.  4. The aortic valve is normal in structure. Aortic valve regurgitation is not visualized. No aortic stenosis is present.  5. The inferior vena cava is normal in size with greater than 50% respiratory variability, suggesting right atrial pressure of 3 mmHg. FINDINGS  Left Ventricle:  Left ventricular ejection fraction, by estimation, is 60 to 65%. The left ventricle has normal function. The left ventricle has no regional wall motion abnormalities. Definity contrast agent was given IV to delineate the left ventricular  endocardial borders. The left ventricular internal cavity size was normal in size. There is no left ventricular hypertrophy. Left ventricular diastolic parameters were normal. Normal left ventricular filling pressure. Right Ventricle: The right ventricular size is normal. No increase in right ventricular wall thickness. Right ventricular systolic function is normal. There is normal pulmonary artery systolic pressure. The tricuspid regurgitant velocity is 2.44 m/s, and  with an assumed right atrial pressure of 3 mmHg, the estimated right ventricular  systolic pressure is 29.5 mmHg. Left Atrium: Left atrial size was normal in size. Right Atrium: Right atrial size was normal in size. Pericardium: There is no evidence of pericardial effusion. Mitral Valve: The mitral valve is normal in structure. No evidence of mitral valve regurgitation. No evidence of mitral valve stenosis. Tricuspid Valve: The tricuspid valve is normal in structure. Tricuspid valve regurgitation is not demonstrated. No evidence of tricuspid stenosis. Aortic Valve: The aortic valve is normal in structure. Aortic valve regurgitation is not visualized. No aortic stenosis is present. Aortic valve mean gradient measures 3.0 mmHg. Aortic valve peak gradient measures 5.4 mmHg. Aortic valve area, by VTI measures 3.56 cm. Pulmonic Valve: The pulmonic valve was normal in structure. Pulmonic valve regurgitation is not visualized. No evidence of pulmonic stenosis. Aorta: The aortic root is normal in size and structure. Venous: The inferior vena cava is normal in size with greater than 50% respiratory variability, suggesting right atrial pressure of 3 mmHg. IAS/Shunts: No atrial level shunt detected by color flow Doppler.  LEFT VENTRICLE PLAX 2D LVIDd:         4.70 cm      Diastology LVIDs:         3.20 cm      LV e' medial:    12.10 cm/s LV PW:         1.00 cm      LV E/e' medial:  8.2 LV IVS:        1.00 cm      LV e' lateral:   11.30 cm/s LVOT diam:     2.00 cm      LV E/e' lateral: 8.8 LV SV:         75 LV SV Index:   36 LVOT Area:     3.14 cm  LV Volumes (MOD) LV vol d, MOD A2C: 112.0 ml LV vol d, MOD A4C: 139.0 ml LV vol s, MOD A2C: 39.3 ml LV vol s, MOD A4C: 44.4 ml LV SV MOD A2C:     72.7 ml LV SV MOD A4C:     139.0 ml LV SV MOD BP:      87.7 ml RIGHT VENTRICLE RV Basal diam:  3.30 cm RV Mid diam:    3.20 cm TAPSE (M-mode): 2.2 cm LEFT ATRIUM           Index        RIGHT ATRIUM           Index LA diam:      3.50 cm 1.69 cm/m   RA Area:     13.40 cm LA Vol (A2C): 29.5 ml 14.22 ml/m  RA Volume:    29.60 ml  14.27 ml/m LA Vol (A4C): 40.0 ml 19.28 ml/m  AORTIC VALVE  PULMONIC VALVE AV Area (Vmax):    3.47 cm     PV Vmax:       1.28 m/s AV Area (Vmean):   3.42 cm     PV Peak grad:  6.6 mmHg AV Area (VTI):     3.56 cm AV Vmax:           116.00 cm/s AV Vmean:          83.200 cm/s AV VTI:            0.210 m AV Peak Grad:      5.4 mmHg AV Mean Grad:      3.0 mmHg LVOT Vmax:         128.00 cm/s LVOT Vmean:        90.700 cm/s LVOT VTI:          0.238 m LVOT/AV VTI ratio: 1.13  AORTA Ao Root diam: 3.10 cm Ao Asc diam:  3.20 cm MITRAL VALVE               TRICUSPID VALVE MV Area (PHT): 3.53 cm    TR Peak grad:   23.8 mmHg MV Decel Time: 215 msec    TR Vmax:        244.00 cm/s MR Peak grad: 23.9 mmHg MR Vmax:      244.50 cm/s  SHUNTS MV E velocity: 99.40 cm/s  Systemic VTI:  0.24 m MV A velocity: 78.40 cm/s  Systemic Diam: 2.00 cm MV E/A ratio:  1.27 Mihai Croitoru MD Electronically signed by Sanda Klein MD Signature Date/Time: 12/20/2021/1:06:55 PM    Final    CT Angio Chest Pulmonary Embolism (PE) W or WO Contrast  Result Date: 12/19/2021 CLINICAL DATA:  Shortness of breath. Evaluate for pulmonary embolism. EXAM: CT ANGIOGRAPHY CHEST WITH CONTRAST TECHNIQUE: Multidetector CT imaging of the chest was performed using the standard protocol during bolus administration of intravenous contrast. Multiplanar CT image reconstructions and MIPs were obtained to evaluate the vascular anatomy. RADIATION DOSE REDUCTION: This exam was performed according to the departmental dose-optimization program which includes automated exposure control, adjustment of the mA and/or kV according to patient size and/or use of iterative reconstruction technique. CONTRAST:  67m OMNIPAQUE IOHEXOL 350 MG/ML SOLN COMPARISON:  Chest radiograph -12/17/2021 FINDINGS: Vascular Findings: There is adequate opacification of the pulmonary arterial system with the main pulmonary artery measuring 221 Hounsfield units. No discrete  filling defects are seen within the pulmonary arterial tree to suggest pulmonary embolism. Normal caliber of the main pulmonary artery. Borderline cardiomegaly. Coronary artery calcifications. No pericardial effusion though a small amount of fluid is seen within the pericardial recess. No evidence of thoracic aortic aneurysm or dissection on this nongated examination. Bovine configuration of the aortic arch. Atherosclerotic plaque involving the aortic arch and descending thoracic aorta, not resulting in a hemodynamically significant stenosis. The descending thoracic aorta is tortuous but of normal caliber. Review of the MIP images confirms the above findings. ---------------------------------------------------------------------------------- Nonvascular Findings: Mediastinum/Lymph Nodes: Borderline enlarged mediastinal and hilar lymph nodes with index precarinal lymph node measuring 1 cm in greatest short axis diameter (image 30, series 4), index prevascular lymph node measuring 0.9 cm (image 72), index right paratracheal lymph node measuring 0.9 cm (image 18), index right infrahilar lymph node measuring 0.9 cm (image 43, series 4 and index left hilar nodal conglomeration measuring 1.0 (image 35) and 0.9 cm (image 40, series 4), nonspecific. No axillary lymphadenopathy. Lungs/Pleura: Extensive bilateral crazy paving pattern as evidenced by extensive ground-glass opacities and  interlobular septal thickening, progressed compared to chest radiograph performed 12/17/2021. Minimal subpleural atelectasis involving the anterior aspects of the bilateral upper lobes. No associated air bronchograms. The central pulmonary airways appear widely patent. Trace bilateral pleural effusions, left-greater-than-right. Evaluation for discrete pulmonary nodules is degraded secondary to extensive parenchymal abnormalities. Upper abdomen: Limited early arterial phase evaluation of the upper abdomen demonstrates an approximately 5.3 x 3.4  cm hypoattenuating lesion involving the central aspect of the right lobe of the liver (image 81, series 4), incompletely evaluated on the present examination though potentially representative of a minimally complex hepatic cyst. Musculoskeletal: No acute or aggressive osseous abnormalities. Incidental note made of a right-sided cervical rib. Moderate DDD of C6-C7 with disc space height loss, endplate irregularity and sclerosis. Regional soft tissues appear normal. Normal appearance of the thyroid gland. IMPRESSION: 1. No evidence of pulmonary embolism. 2. Extensive bilateral crazy paving pattern characterized by extensive bilateral ground-glass opacities and interlobular septal thickening. Differential considerations are broad and include pulmonary edema, diffuse alveolar hemorrhage, ARDS, acute hypersensitivity pneumonitis and (favored) atypical infection (including COVID-19 pneumonia). Clinical correlation is advised. Follow-up chest radiograph in 4-6 weeks is recommended to ensure resolution. 3. Borderline enlarged mediastinal and hilar lymphadenopathy, nonspecific though presumably reactive in etiology. 4. Borderline cardiomegaly. 5. Coronary artery calcifications. Aortic Atherosclerosis (ICD10-I70.0). 6. Indeterminate approximately 5.3 cm hypoattenuating lesion within the central aspect of the right lobe of the liver, potentially representative of a complex hepatic cyst, though incompletely evaluated on the present examination. Comparison with prior outside examinations (if available), is advised. Otherwise, further evaluation with nonemergent abdominal MRI is recommended for further characterization. Electronically Signed   By: Sandi Mariscal M.D.   On: 12/19/2021 16:22

## 2021-12-21 NOTE — TOC Progression Note (Signed)
Transition of Care [facility]) - Progression Note    Patient Details  Name: Erin [name] MRN: [id number] Date of Birth: 1950/06/07  Transition [facility] CM/SW Contact  Adrian Prows, RN Phone Number: 12/21/2021, 9:11 AM  Clinical Narrative:     Pt remains on O2; not ready for d/c; TOC will con't  to follow.  Expected Discharge Plan: Home/Self Care Barriers to Discharge: Continued Medical Work up  Expected Discharge Plan and Services Expected Discharge Plan: Home/Self Care   Discharge Planning Services: CM Consult   Living arrangements for the past 2 months: Single Family Home                                       Social Determinants of Health (SDOH) Interventions    Readmission Risk Interventions     No data to display

## 2021-12-21 NOTE — Progress Notes (Addendum)
   12/21/21 1800  Clinical Encounter Type  Visited With Patient and family together  Visit Type Initial;Psychological support;Spiritual support;Critical Care  Referral From Chaplain;Nurse  Consult/Referral To Chaplain  Recommendations Grief and illness  Spiritual Encounters  Spiritual Needs Sacred text;Prayer;Ritual;Emotional;Grief support  Stress Factors  Patient Stress Factors Loss;Loss of control;Major life changes  Family Stress Factors Loss;Loss of control;Major life changes   Met with patient who is admitted to ICU and sat with her as she went to her husbands room down the hallway - who just unexpectedly passed within this hour.    Wife and daughter are in grief and shock.  Attended to both family members as they expressed their grief.  Provided emotional support.  Chaplain services will remain available to this family throughout evening.

## 2021-12-22 DIAGNOSIS — S27309D Unspecified injury of lung, unspecified, subsequent encounter: Secondary | ICD-10-CM | POA: Diagnosis not present

## 2021-12-22 DIAGNOSIS — I10 Essential (primary) hypertension: Secondary | ICD-10-CM | POA: Diagnosis not present

## 2021-12-22 DIAGNOSIS — J189 Pneumonia, unspecified organism: Secondary | ICD-10-CM | POA: Diagnosis not present

## 2021-12-22 DIAGNOSIS — J9601 Acute respiratory failure with hypoxia: Secondary | ICD-10-CM | POA: Diagnosis not present

## 2021-12-22 DIAGNOSIS — A419 Sepsis, unspecified organism: Secondary | ICD-10-CM | POA: Diagnosis not present

## 2021-12-22 LAB — CBC WITH DIFFERENTIAL/PLATELET
Abs Immature Granulocytes: 0.12 10*3/uL — ABNORMAL HIGH (ref 0.00–0.07)
Basophils Absolute: 0 10*3/uL (ref 0.0–0.1)
Basophils Relative: 0 %
Eosinophils Absolute: 0 10*3/uL (ref 0.0–0.5)
Eosinophils Relative: 0 %
HCT: 32.2 % — ABNORMAL LOW (ref 36.0–46.0)
Hemoglobin: 10.7 g/dL — ABNORMAL LOW (ref 12.0–15.0)
Immature Granulocytes: 1 %
Lymphocytes Relative: 8 %
Lymphs Abs: 1.2 10*3/uL (ref 0.7–4.0)
MCH: 28.8 pg (ref 26.0–34.0)
MCHC: 33.2 g/dL (ref 30.0–36.0)
MCV: 86.8 fL (ref 80.0–100.0)
Monocytes Absolute: 0.4 10*3/uL (ref 0.1–1.0)
Monocytes Relative: 3 %
Neutro Abs: 12.2 10*3/uL — ABNORMAL HIGH (ref 1.7–7.7)
Neutrophils Relative %: 88 %
Platelets: 424 10*3/uL — ABNORMAL HIGH (ref 150–400)
RBC: 3.71 MIL/uL — ABNORMAL LOW (ref 3.87–5.11)
RDW: 13.9 % (ref 11.5–15.5)
WBC: 13.9 10*3/uL — ABNORMAL HIGH (ref 4.0–10.5)
nRBC: 0 % (ref 0.0–0.2)

## 2021-12-22 LAB — GLUCOSE, CAPILLARY
Glucose-Capillary: 153 mg/dL — ABNORMAL HIGH (ref 70–99)
Glucose-Capillary: 160 mg/dL — ABNORMAL HIGH (ref 70–99)
Glucose-Capillary: 131 mg/dL — ABNORMAL HIGH (ref 70–99)
Glucose-Capillary: 156 mg/dL — ABNORMAL HIGH (ref 70–99)
Glucose-Capillary: 223 mg/dL — ABNORMAL HIGH (ref 70–99)
Glucose-Capillary: 139 mg/dL — ABNORMAL HIGH (ref 70–99)
Glucose-Capillary: 153 mg/dL — ABNORMAL HIGH (ref 70–99)

## 2021-12-22 LAB — PROCALCITONIN: Procalcitonin: <0.1 ng/mL

## 2021-12-22 LAB — BASIC METABOLIC PANEL
Anion gap: 9 (ref 5–15)
BUN: 29 mg/dL — ABNORMAL HIGH (ref 8–23)
CO2: 27 mmol/L (ref 22–32)
Calcium: 9.3 mg/dL (ref 8.9–10.3)
Chloride: 101 mmol/L (ref 98–111)
Creatinine, Ser: 0.65 mg/dL (ref 0.44–1.00)
GFR, Estimated: >60 mL/min (ref >60)
Glucose, Bld: 144 mg/dL — ABNORMAL HIGH (ref 70–99)
Potassium: 4 mmol/L (ref 3.5–5.1)
Sodium: 137 mmol/L (ref 135–145)

## 2021-12-22 LAB — MAGNESIUM: Magnesium: 2.1 mg/dL (ref 1.7–2.4)

## 2021-12-22 MED ORDER — METHYLPREDNISOLONE SODIUM SUCC 125 MG IJ SOLR
80.0000 mg | Freq: Four times a day (QID) | INTRAMUSCULAR | Status: DC
  Administered 2021-12-22: 80 mg via INTRAVENOUS
  Filled 2021-12-22: qty 2

## 2021-12-22 MED ORDER — LORAZEPAM 0.5 MG PO TABS
0.5000 mg | ORAL_TABLET | Freq: Three times a day (TID) | ORAL | Status: DC | PRN
  Administered 2021-12-22 – 2021-12-25 (×5): 0.5 mg via ORAL
  Filled 2021-12-22 (×5): qty 1

## 2021-12-22 MED ORDER — METHYLPREDNISOLONE SODIUM SUCC 125 MG IJ SOLR
80.0000 mg | Freq: Every day | INTRAMUSCULAR | Status: DC
  Administered 2021-12-23 – 2021-12-26 (×4): 80 mg via INTRAVENOUS
  Filled 2021-12-22 (×4): qty 2

## 2021-12-22 MED ORDER — IPRATROPIUM-ALBUTEROL 0.5-2.5 (3) MG/3ML IN SOLN: 3.0000 mL | RESPIRATORY_TRACT | Status: DC | PRN

## 2021-12-22 NOTE — Progress Notes (Addendum)
NAME:  Erin [name], MRN:  [id number], DOB:  07-Feb-1951, LOS: 5 ADMISSION DATE:  12/17/2021, CONSULTATION DATE:  12/19/21 REFERRING MD:  Dr Wyline Copas of [facility] CHIEF COMPLAINT:  Acute hypoxemic resp failure,    BRIEF   71 year old "big time" smoker who is retired.  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 Suffers from depression, anxiety, previous history of alcohol abuse in remission currently on December 12, 2021 Monday her 20-year-old granddaughter got diagnosed with her first ever RSV infection (also had covid a month ago from day care).  She did have substantial exposure to this granddaughter.  Then on 12/15/2021 Thursday patient became ill abruptly and presented to the ER on 12/17/2021 with 2-day history of progressive cough and shortness of breath.  Found to be hypoxemic.  CT angiogram today ruled out pulmonary embolism but showed diffuse groundglass opacities raising concern for a broad list of differential diagnosis.  She is on antibacterial community-acquired pneumonia antibiotics with Rocephin and azithromycin but without improvement.  Requiring 15 L high flow nasal cannula and subjectively feeling okay and watching TV but is actually tachypneic.  She was febrile 101 Fahrenheit with a high white count consistent with SIRS physiology.  Concern for bacterial pneumonia/sepsis.  Given the presence of infiltrates and hypoxemia  Her blood eosinophils were high at 900 cells per cubic millimeter at admission.  Previously normal.  No prior history of asthma.  No mold exposure in the house.  No feather pillow or feather blankets in the house.  No pet birds in the house. Noted per daughte 12/20/21" old home . There could be mold. Dog in home urinates all over  Critical care medicine called for consultation.  Past Medical History:    has a past medical history of Alcohol abuse (11/08/2016), Alcoholism ([place]), Depression, and Hyperlipemia.   reports that she has been smoking cigarettes. She has been smoking  an average of 1 pack per day. She has never used smokeless tobacco.   Significant Hospital Events:  12/17/2021 - admit 12/19/21 - ccm consult  - LDH 450  - CRP 25/ ESR 111  - BNP 107  - EOS 1200 (amdit 600)  - Quant GOld >> neg  - RVP 12/19/21 - NEG  - VASCULITIS PANEL >>  - SEROLOGY panel >>  - Urine strep/leg >>  - covid IgG - POSITIVE (2 year grand kid had covid 2 mo ago)  12/20/21 Bronch BAL   -11% eos, 86% PMN NO ALV hge  - repeat RVP neg  - stARted solumedrol 153m Q6h  - 13L HFNC. Using BiPAP QHs and day time 1-2 times 126-Nov-2023- Used bipap all night. This am down to 8L [place] and hungry. Afebrile. Feeling better  Interim History / Subjective:  Afebrile Down to 2L , wore bipap overnight Overall, feels breathing is  improved from yesterday Reported that patient's husband passed away 1Nov 26, 2024from respiratory failure/ ?covid > dealing with grief and anxiety today   Objective   Blood pressure (!) 119/51, pulse 68, temperature 97.7 F (36.5 C), temperature source Oral, resp. rate (!) 21, weight 91.6 kg, SpO2 100 %.        Intake/Output Summary (Last 24 hours) at 12/22/2021 1236 Last data filed at 12/22/2021 0950 Gross per 24 hour  Intake 97.98 ml  Output 850 ml  Net -752.02 ml   Filed Weights   12/17/21 1657  Weight: 91.6 kg   Examination: General:  Older female sitting in bedside recliner in NAD HEENT: MM pink/moist Neuro: Aox3,  MAE CV: rr, no murmur PULM:  non labored, clear anteriorly, bibasilar rales, occasional insp wheeze GI: soft, bs+, NT/ ND Extremities: warm/dry, no LE edema  Skin: no rashes    Resolved Hospital Problem list    Assessment & Plan:   Acute hypoxemic respiratory failure with pulmonary infiltrates  - BAL 11/7 Features c/w ALI - AIP with >80% PMN with AEP (11% eos) 2nd in ddx.  - RVP x 2 neg including from BAL - little improvement with abx/ diureses - started on solumedrol 19m q 6 11/7 with continued rapid improvement  - normal echo  11/7 Tobacco abuse P:   - continue to wean O2 for sat goal > 88% - BiPAP prn, likely will not need anymore - Pending serology and vasculitis panel - Pending QuantiFERON gold - Urine strep, urine Legionella > not collected yet - completed ceftriaxone/ doxy after 5 day course, monitor clinically off abx - start weaning solumedrol 1273mdaily> 8065maily - aggressive pulmonary hygiene- IS/ PT - nicotine patch, smoking cessation  - will need output f/u with Dr. RamJoan Floresr Primary team.  Pulmonary will continue to follow  Best practice (daily eval):   per [facility] MD     BroKennieth RadSN, AG-ACNP-BC [facility] 12/22/2021, 4:03 PM  See Amion for pager If no response to pager, please call PCCM consult pager After 7:00 pm call Elink     LABS    Latest Reference Range & Units 07/14/09 20:23 09/06/09 13:50 05/16/20 17:51 12/17/21 18:50  Eosinophils Absolute 0.0 - 0.5 K/uL 0.0 0.0 0.0 0.9 (H)  (H): Data is abnormally high PULMONARY Recent Labs  Lab 12/19/21 1843  PHART 7.52*  PCO2ART 35  PO2ART 67*  HCO3 28.6*  O2SAT 95.8    CBC Recent Labs  Lab 12/20/21 0333 12/21/21 0327 12/22/21 0313  HGB 10.6* 10.9* 10.7*  HCT 32.1* 33.5* 32.2*  WBC 12.1* 8.7 13.9*  PLT 389 442* 424*    COAGULATION Recent Labs  Lab 12/18/21 0040  INR 1.2    CARDIAC  No results for input(s): "TROPONINI" in the last 168 hours. No results for input(s): "PROBNP" in the last 168 hours.  CHEMISTRY Recent Labs  Lab 12/18/21 0040 12/18/21 0600 12/19/21 0322 12/20/21 0333 12/21/21 0327 12/22/21 0313  NA  --  132* 132* 133* 135 137  K  --  3.8 4.0 3.9 4.4 4.0  CL  --  100 100 97* 98 101  CO2  --  _0 GLUCOSE  --  106* 108* 113* 157* 144*  BUN  --  _1 29*  CREATININE  --  0.58 0.57 0.67 0.64 0.65  CALCIUM  --  8.6* 8.7* 8.9 9.4 9.3  MG 1.9  --   --  1.9  --  2.1  PHOS  --   --   --  4.1  --   --    CrCl cannot be  calculated (Unknown ideal weight.).   LIVER Recent Labs  Lab 12/17/21 1850 12/18/21 0040 12/18/21 0600 12/21/21 0327  AST 37  --  37 35  ALT 36  --  33 33  ALKPHOS 209*  --  202* 201*  BILITOT 0.9  --  0.8 0.6  PROT 6.7  --  6.1* 6.5  ALBUMIN 2.6*  --  2.3* 2.4*  INR  --  1.2  --   --      INFECTIOUS Recent Labs  Lab 12/18/21 0040 12/18/21 0600 12/19/21 0322  LATICACIDVEN 1.3  --   --   PROCALCITON 0.18 0.13 0.13     ENDOCRINE CBG (last 3)  Recent Labs    12/22/21 0412 12/22/21 0801 12/22/21 1149  GLUCAP 160* 131* 156*    IMAGING x48h  - image(s) personally visualized  -   highlighted in bold DG CHEST PORT 1 VIEW  Result Date: 12/21/2021 CLINICAL DATA:  Acute on chronic respiratory failure EXAM: PORTABLE CHEST 1 VIEW COMPARISON:  Chest radiograph dated 12/20/2021, CTA chest dated 12/19/2021 FINDINGS: Improved lung aeration. Decreased but persistent patchy and interstitial opacities throughout both lungs. Decreased areas of more confluent opacities in the left greater than right lung. No definite pleural effusion or pneumothorax. The heart size and mediastinal contours are within normal limits. The visualized skeletal structures are unremarkable. IMPRESSION: Improved lung aeration with decreased but persistent patchy and interstitial opacities throughout both lungs. Electronically Signed   By: Darrin Nipper M.D.   On: 12/21/2021 10:20   DG CHEST PORT 1 VIEW  Result Date: 12/20/2021 CLINICAL DATA:  Respiratory distress EXAM: PORTABLE CHEST 1 VIEW COMPARISON:  Chest x-ray dated December 17, 2021 FINDINGS: The heart size and mediastinal contours are within normal limits. Diffuse heterogeneous opacities, increased when compared with prior chest x-ray. The visualized skeletal structures are unremarkable. IMPRESSION: Diffuse heterogeneous opacities, worsened when compared with prior radiograph, likely due to pulmonary edema. Electronically Signed   By: Yetta Glassman M.D.   On:  12/20/2021 16:48   ECHOCARDIOGRAM COMPLETE  Result Date: 12/20/2021    ECHOCARDIOGRAM REPORT   Patient Name:   ITZELL BENDAVID Date of Exam: 12/20/2021 Medical Rec #:  [id number]              Height:       69.0 in Accession #:    [id number]             Weight:       202.0 lb Date of Birth:  1951-01-11              BSA:          2.075 m Patient Age:    57 years               BP:           141/61 mmHg Patient Gender: F                      HR:           77 bpm. Exam Location:  Inpatient Procedure: 2D Echo and Intracardiac Opacification Agent Indications:    CHF  History:        Patient has no prior history of Echocardiogram examinations.  Sonographer:    Harvie Junior Referring Phys: [phone] STEPHEN K CHIU  Sonographer Comments: Technically difficult study due to poor echo windows. IMPRESSIONS  1. Left ventricular ejection fraction, by estimation, is 60 to 65%. The left ventricle has normal function. The left ventricle has no regional wall motion abnormalities. Left ventricular diastolic parameters were normal.  2. Right ventricular systolic function is normal. The right ventricular size is normal. There is normal pulmonary artery systolic pressure.  3. The mitral valve is normal in structure. No evidence of mitral valve regurgitation. No evidence of mitral stenosis.  4. The aortic valve is normal in structure. Aortic valve regurgitation is not visualized. No aortic stenosis is present.  5. The inferior vena cava is normal  in size with greater than 50% respiratory variability, suggesting right atrial pressure of 3 mmHg. FINDINGS  Left Ventricle: Left ventricular ejection fraction, by estimation, is 60 to 65%. The left ventricle has normal function. The left ventricle has no regional wall motion abnormalities. Definity contrast agent was given IV to delineate the left ventricular  endocardial borders. The left ventricular internal cavity size was normal in size. There is no left ventricular hypertrophy. Left  ventricular diastolic parameters were normal. Normal left ventricular filling pressure. Right Ventricle: The right ventricular size is normal. No increase in right ventricular wall thickness. Right ventricular systolic function is normal. There is normal pulmonary artery systolic pressure. The tricuspid regurgitant velocity is 2.44 m/s, and  with an assumed right atrial pressure of 3 mmHg, the estimated right ventricular systolic pressure is 16.1 mmHg. Left Atrium: Left atrial size was normal in size. Right Atrium: Right atrial size was normal in size. Pericardium: There is no evidence of pericardial effusion. Mitral Valve: The mitral valve is normal in structure. No evidence of mitral valve regurgitation. No evidence of mitral valve stenosis. Tricuspid Valve: The tricuspid valve is normal in structure. Tricuspid valve regurgitation is not demonstrated. No evidence of tricuspid stenosis. Aortic Valve: The aortic valve is normal in structure. Aortic valve regurgitation is not visualized. No aortic stenosis is present. Aortic valve mean gradient measures 3.0 mmHg. Aortic valve peak gradient measures 5.4 mmHg. Aortic valve area, by VTI measures 3.56 cm. Pulmonic Valve: The pulmonic valve was normal in structure. Pulmonic valve regurgitation is not visualized. No evidence of pulmonic stenosis. Aorta: The aortic root is normal in size and structure. Venous: The inferior vena cava is normal in size with greater than 50% respiratory variability, suggesting right atrial pressure of 3 mmHg. IAS/Shunts: No atrial level shunt detected by color flow Doppler.  LEFT VENTRICLE PLAX 2D LVIDd:         4.70 cm      Diastology LVIDs:         3.20 cm      LV e' medial:    12.10 cm/s LV PW:         1.00 cm      LV E/e' medial:  8.2 LV IVS:        1.00 cm      LV e' lateral:   11.30 cm/s LVOT diam:     2.00 cm      LV E/e' lateral: 8.8 LV SV:         75 LV SV Index:   36 LVOT Area:     3.14 cm  LV Volumes (MOD) LV vol d, MOD A2C: 112.0  ml LV vol d, MOD A4C: 139.0 ml LV vol s, MOD A2C: 39.3 ml LV vol s, MOD A4C: 44.4 ml LV SV MOD A2C:     72.7 ml LV SV MOD A4C:     139.0 ml LV SV MOD BP:      87.7 ml RIGHT VENTRICLE RV Basal diam:  3.30 cm RV Mid diam:    3.20 cm TAPSE (M-mode): 2.2 cm LEFT ATRIUM           Index        RIGHT ATRIUM           Index LA diam:      3.50 cm 1.69 cm/m   RA Area:     13.40 cm LA Vol (A2C): 29.5 ml 14.22 ml/m  RA Volume:   29.60 ml  14.27 ml/m  LA Vol (A4C): 40.0 ml 19.28 ml/m  AORTIC VALVE                    PULMONIC VALVE AV Area (Vmax):    3.47 cm     PV Vmax:       1.28 m/s AV Area (Vmean):   3.42 cm     PV Peak grad:  6.6 mmHg AV Area (VTI):     3.56 cm AV Vmax:           116.00 cm/s AV Vmean:          83.200 cm/s AV VTI:            0.210 m AV Peak Grad:      5.4 mmHg AV Mean Grad:      3.0 mmHg LVOT Vmax:         128.00 cm/s LVOT Vmean:        90.700 cm/s LVOT VTI:          0.238 m LVOT/AV VTI ratio: 1.13  AORTA Ao Root diam: 3.10 cm Ao Asc diam:  3.20 cm MITRAL VALVE               TRICUSPID VALVE MV Area (PHT): 3.53 cm    TR Peak grad:   23.8 mmHg MV Decel Time: 215 msec    TR Vmax:        244.00 cm/s MR Peak grad: 23.9 mmHg MR Vmax:      244.50 cm/s  SHUNTS MV E velocity: 99.40 cm/s  Systemic VTI:  0.24 m MV A velocity: 78.40 cm/s  Systemic Diam: 2.00 cm MV E/A ratio:  1.27 Mihai Croitoru MD Electronically signed by Sanda Klein MD Signature Date/Time: 12/20/2021/1:06:55 PM    Final

## 2021-12-22 NOTE — Progress Notes (Signed)
   12/22/21 1100  Clinical Encounter Type  Visited With Patient and family together  Visit Type Follow-up;Psychological support;Spiritual support;Social support;Critical Care  Referral From Chaplain  Consult/Referral To Chaplain  Recommendations  (Grief Support)  Spiritual Encounters  Spiritual Needs Sacred text;Prayer;Emotional;Grief support  Stress Factors  Patient Stress Factors Loss of control;Loss;Major life changes  Family Stress Factors None identified   Chaplain made a follow up visit with patient who was being visited by her two sisters.  She was in her own room today and after being with her deceased husband last night - she spoke of her processing her grief.  Chaplain checked in to provided additional support for her grief and her own medical health challenges at this time.

## 2021-12-22 NOTE — Progress Notes (Signed)
PROGRESS NOTE    Erin [name]  YYT:[id number] DOB: 18-May-1950 DOA: 12/17/2021 PCP: Aura Dials, PA-C    Chief Complaint  Patient presents with   Shortness of Breath    Brief Narrative:  71 y.o. female with medical history significant for depression, anxiety, and history of alcohol abuse in remission who presents emergency department for evaluation of cough and shortness of breath.   Patient initially sought care at an urgent care for 2 days of progressive cough and shortness of breath, was found to be hypoxic, and directed to the ED. In ED, pt was found to have xray findings worrisome for multifocal PNA with leukocytosis. Pt was started on abx. -CT angiogram chest done showed diffuse groundglass opacities raising concern for multiple differential diagnosis. -Due to worsening respiratory status and increased O2 requirements PCCM consulted. -Patient seen in consultation by PCCM and subsequently underwent bronchoscopy with BAL.  Patient due to concern for acute lung injury placed on IV Solu-Medrol with clinical improvement.   Assessment & Plan:   Principal Problem:   Sepsis due to pneumonia [facility]) Active Problems:   Acute respiratory failure with hypoxia (HCC)   Acute lung injury   Multifocal pneumonia   Depression   Hypokalemia   Essential hypertension   Prolonged QT interval   QT prolongation   Hypertension  #1 acute hypoxemic respiratory failure, POA secondary to ALI/ pneumonia -Patient noted to have presented with 2-day history of progressive cough, shortness of breath, noted to be hypoxemic. -CT angiogram negative for PE but did show diffuse groundglass opacities raising concern for a broad list of differential diagnosis. -Due to increased O2 requirements going up as high as 15 L high flow nasal cannula and required BiPAP PCCM consulted. 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 -Patient given a dose of IV Lasix (12/20/2021) with urine output not properly recorded -Patient started on high-dose IV Solu-Medrol 125 mg every 6 hours with clinical improvement with O2 sats down to 6 L high flow nasal cannula today from BiPAP overnight. -Serology ordered per PCCM including vasculitis panel, QuantiFERON gold, urine Legionella antigen, urine strep pneumococcus antigen. -Doxycycline discontinued per PCCM and patient maintained on IV Rocephin pending urine strep Legionella antigen. -PCCM recommending BiPAP nightly and daytime 1-2 applications. -Per PCCM May DC daily BiPAP once down to < 6 L and continue BiPAP nightly and may DC nightly BiPAP once < 4 L O2 requirements. -PCCM recommending IV Solu-Medrol through 12/21/2021 and then to start oral prednisone taper 12/23/2021 and taper over a few weeks to few months. -We will need outpatient follow-up with PCCM.  2.  Severe sepsis due to pneumonia -Patient on admission met criteria for severe sepsis with multifocal pneumonia concerns, fever, leukocytosis, tachypnea, acute hypoxic respiratory failure and placed empirically on IV Rocephin and azithromycin in the ED. -Due to increased O2 requirement CT angiogram chest on 12/19/2021 negative for PE however notable for extensive be crazy paving pattern with be groundglass opacities and interlobular septal thickening. -2D echo done EF 60 to 65%,NWMA. -See problem #1 above.  3.  Hypertension -BP was soft yesterday but has improved.   -Continue clonidine.  4.  Depression/anxiety -Stable.   -Continue home regimen Wellbutrin, Prozac, Latuda, hydroxyzine.  5.  Prolonged QT interval -QTc noted at 507 on presentation in the ED. -Repeat EKG with RBBB with no significant change from prior EKG, some improvement with QTc. -  Electrolytes repleted. -Avoid QTc prolongation medications.  6.  Liver lesion -Incidental 5.3 cm right lobe  lesion on CTA of chest noted. -Recommend nonemergent abdominal MRI once pulmonary issues have resolved which may be done in the outpatient setting.  DVT prophylaxis: Lovenox Code Status: Full Family Communication: Updated patient.  No family at bedside.  Disposition: TBD  Status is: Inpatient Remains inpatient appropriate because: Severity of illness   Consultants:  PCCM: Dr. Chase Caller 12/19/2021  Procedures:  Flexible bronchoscopy, BAL of the lingula per PCCM, Dr. Chase Caller 12/20/2021 CT angiogram chest 12/19/2021 Chest x-ray 12/17/2021, 12/20/2021, 12/21/2021 2D echo 12/20/2021  Significant Hospital Events:  12/17/2021 - admit 12/19/21 - ccm consult             - LDH 450             - CRP 25/ ESR 111             - BNP 107             - EOS 1200 (amdit 600)             - Quant GOld >> negative             - RVP 12/19/21 - NEG             - VASCULITIS PANEL >>             - SEROLOGY panel >>             - Urine strep/leg >>             - covid IgG - POSITIVE (2 year grand kid had covid 2 mo ago)   12/20/21 Bronch BAL              -11% eos, 86% PMN NO ALV hge             - repeat RVP neg             - stARted solumedrol 182m Q6h               - 13L HFNC. Using BiPAP QHs and day time 1-2 times  Antimicrobials:  Oral Azithromycin 12/17/2021 x 1 dose IV Rocephin 12/17/2021>>>> 12/22/2021 Doxycycline 12/18/2021>>>> 12/21/2021   Subjective: Patient noted overnight to have required BiPAP currently on 6 L high flow nasal cannula.  Feels some improvement with shortness of breath.  Denies any chest pain.  No abdominal pain.  Patient's husband noted to have passed yesterday in the stepdown unit/ICU.   Objective: Vitals:   12/22/21 0700 12/22/21 0800 12/22/21 0850 12/22/21 0853  BP: (!) 114/59 134/67    Pulse: (!) 55 (!) 57    Resp: 19 (!) 21    Temp: (!) 97.4 F (36.3 C)     TempSrc: Axillary     SpO2: 96% 96% 100% 100%  Weight:        Intake/Output Summary (Last 24 hours) at  12/22/2021 0911 Last data filed at 12/22/2021 0600 Gross per 24 hour  Intake 97.98 ml  Output 200 ml  Net -102.02 ml    Filed Weights   12/17/21 1657  Weight: 91.6 kg    Examination:  General exam: NAD Respiratory system: Scattered coarse breath sounds.  No wheezing.  Fair air movement.  Speaking in full sentences.  On 6 L high flow nasal cannula.   Cardiovascular system: RRR no murmurs rubs or gallops.  No JVD.  No lower extremity edema.  Gastrointestinal system:  Abdomen abdomen is soft, nontender, nondistended, positive bowel sounds.  No rebound.  No guarding. Central nervous system: Alert and oriented. No focal neurological deficits. Extremities: Symmetric 5 x 5 power. Skin: No rashes, lesions or ulcers Psychiatry: Judgement and insight appear normal. Mood & affect appropriate.     Data Reviewed: I have personally reviewed following labs and imaging studies  CBC: Recent Labs  Lab 12/17/21 1850 12/18/21 0600 12/19/21 0322 12/19/21 1855 12/20/21 0333 12/21/21 0327 12/22/21 0313  WBC 12.8*   < > 11.9* 13.6* 12.1* 8.7 13.9*  NEUTROABS 9.5*  --   --  10.3*  --   --  12.2*  HGB 11.2*   < > 10.6* 11.1* 10.6* 10.9* 10.7*  HCT 33.0*   < > 31.7* 34.0* 32.1* 33.5* 32.2*  MCV 87.5   < > 87.1 88.5 87.7 88.4 86.8  PLT 409*   < > 399 429* 389 442* 424*   < > = values in this interval not displayed.     Basic Metabolic Panel: Recent Labs  Lab 12/18/21 0040 12/18/21 0600 12/19/21 0322 12/20/21 0333 12/21/21 0327 12/22/21 0313  NA  --  132* 132* 133* 135 137  K  --  3.8 4.0 3.9 4.4 4.0  CL  --  100 100 97* 98 101  CO2  --  _0 GLUCOSE  --  106* 108* 113* 157* 144*  BUN  --  _1 29*  CREATININE  --  0.58 0.57 0.67 0.64 0.65  CALCIUM  --  8.6* 8.7* 8.9 9.4 9.3  MG 1.9  --   --  1.9  --  2.1  PHOS  --   --   --  4.1  --   --      GFR: CrCl cannot be calculated (Unknown ideal weight.).  Liver Function Tests: Recent Labs  Lab 12/17/21 1850  12/18/21 0600 12/21/21 0327  AST 37 37 35  ALT 36 33 33  ALKPHOS 209* 202* 201*  BILITOT 0.9 0.8 0.6  PROT 6.7 6.1* 6.5  ALBUMIN 2.6* 2.3* 2.4*     CBG: Recent Labs  Lab 12/21/21 1152 12/21/21 2005 12/22/21 0124 12/22/21 0412 12/22/21 0801  GLUCAP 162* 187* 153* 160* 131*      Recent Results (from the past 240 hour(s))  Resp Panel by RT-PCR (Flu A&B, Covid) Anterior Nasal Swab     Status: None   Collection Time: 12/17/21  6:24 PM   Specimen: Anterior Nasal Swab  Result Value Ref Range Status   SARS Coronavirus 2 by RT PCR NEGATIVE NEGATIVE Final    Comment: (NOTE) SARS-CoV-2 target nucleic acids are NOT DETECTED.  The SARS-CoV-2 RNA is generally detectable in upper respiratory specimens during the acute phase of infection. The lowest concentration of SARS-CoV-2 viral copies this assay can detect is 138 copies/mL. A negative result does not preclude SARS-Cov-2 infection and should not be used as the sole basis for treatment or other patient management decisions. A negative result may occur with  improper specimen collection/handling, submission of specimen other than nasopharyngeal swab, presence of viral mutation(s) within the areas targeted by this assay, and inadequate number of viral copies(<138 copies/mL). A negative result must be combined with clinical observations, patient history, and epidemiological information. The expected result is Negative.  Fact Sheet for Patients:  EntrepreneurPulse.com.au  Fact Sheet for Healthcare Providers:  IncredibleEmployment.be  This test is no t yet approved or cleared by the [place] and  has been authorized for detection and/or diagnosis of SARS-CoV-2 by FDA under an Emergency Use Authorization (EUA). This EUA will remain  in effect (meaning this test can be used) for the duration of the COVID-19 declaration under Section 564(b)(1) of the Act, 21 U.S.C.section  360bbb-3(b)(1), unless the authorization is terminated  or revoked sooner.       Influenza A by PCR NEGATIVE NEGATIVE Final   Influenza B by PCR NEGATIVE NEGATIVE Final    Comment: (NOTE) The Xpert Xpress SARS-CoV-2/FLU/RSV plus assay is intended as an aid in the diagnosis of influenza from Nasopharyngeal swab specimens and should not be used as a sole basis for treatment. Nasal washings and aspirates are unacceptable for Xpert Xpress SARS-CoV-2/FLU/RSV testing.  Fact Sheet for Patients: EntrepreneurPulse.com.au  Fact Sheet for Healthcare Providers: IncredibleEmployment.be  This test is not yet approved or cleared by the [place] FDA and has been authorized for detection and/or diagnosis of SARS-CoV-2 by FDA under an Emergency Use Authorization (EUA). This EUA will remain in effect (meaning this test can be used) for the duration of the COVID-19 declaration under Section 564(b)(1) of the Act, 21 U.S.C. section 360bbb-3(b)(1), unless the authorization is terminated or revoked.  Performed at [facility], [place] [address], [place], [place] [address]   Culture, blood (x 2)     Status: None (Preliminary result)   Collection Time: 12/18/21 12:40 AM   Specimen: BLOOD  Result Value Ref Range Status   Specimen Description   Final    BLOOD BLOOD RIGHT FOREARM Performed at [place] [address] [place], [place] [address]    Special Requests   Final    BOTTLES DRAWN AEROBIC AND ANAEROBIC Blood Culture adequate volume Performed at [place] [address] [place], [place] [address]    Culture   Final    NO GROWTH 3 DAYS Performed at [facility], [place] [address] [place], [place] [address]    Report Status PENDING  Incomplete  Culture, blood (x 2)     Status: None (Preliminary result)   Collection Time: 12/18/21 12:40 AM   Specimen: BLOOD  Result Value Ref Range  Status   Specimen Description   Final    BLOOD BLOOD RIGHT HAND Performed at [place] [address] [place], [place] [address]    Special Requests   Final    BOTTLES DRAWN AEROBIC AND ANAEROBIC Blood Culture adequate volume Performed at [place] [address] [place], [place] [address]    Culture   Final    NO GROWTH 3 DAYS Performed at [facility], [place] [address], [place], [place] [address]    Report Status PENDING  Incomplete  MRSA Next Gen by PCR, Nasal     Status: None   Collection Time: 12/18/21  2:20 PM   Specimen: Nasal Mucosa; Nasal Swab  Result Value Ref Range Status   MRSA by PCR Next Gen NOT DETECTED NOT DETECTED Final    Comment: (NOTE) The GeneXpert MRSA Assay (FDA approved for NASAL specimens only), is one component of a comprehensive MRSA colonization surveillance program. It is not intended to diagnose MRSA infection nor to guide or monitor treatment for MRSA infections. Test performance is not FDA approved in patients less than 15 years old. Performed at [facility], [place] [address], [place], [place] [address]   Respiratory (~20 pathogens) panel by PCR     Status: None  Collection Time: 12/19/21  6:30 PM   Specimen: Nasopharyngeal Swab; Respiratory  Result Value Ref Range Status   Adenovirus NOT DETECTED NOT DETECTED Final   Coronavirus 229E NOT DETECTED NOT DETECTED Final    Comment: (NOTE) The Coronavirus on the Respiratory Panel, DOES NOT test for the novel  Coronavirus (2019 nCoV)    Coronavirus HKU1 NOT DETECTED NOT DETECTED Final   Coronavirus NL63 NOT DETECTED NOT DETECTED Final   Coronavirus OC43 NOT DETECTED NOT DETECTED Final   Metapneumovirus NOT DETECTED NOT DETECTED Final   Rhinovirus / Enterovirus NOT DETECTED NOT DETECTED Final   Influenza A NOT DETECTED NOT DETECTED Final   Influenza B NOT DETECTED NOT DETECTED Final   Parainfluenza Virus 1 NOT DETECTED NOT  DETECTED Final   Parainfluenza Virus 2 NOT DETECTED NOT DETECTED Final   Parainfluenza Virus 3 NOT DETECTED NOT DETECTED Final   Parainfluenza Virus 4 NOT DETECTED NOT DETECTED Final   Respiratory Syncytial Virus NOT DETECTED NOT DETECTED Final   Bordetella pertussis NOT DETECTED NOT DETECTED Final   Bordetella Parapertussis NOT DETECTED NOT DETECTED Final   Chlamydophila pneumoniae NOT DETECTED NOT DETECTED Final   Mycoplasma pneumoniae NOT DETECTED NOT DETECTED Final    Comment: Performed at [facility], [address] [place], [place] [address]  Respiratory (~20 pathogens) panel by PCR     Status: None   Collection Time: 12/20/21  4:26 PM   Specimen: Bronchoalveolar Lavage; Respiratory  Result Value Ref Range Status   Adenovirus NOT DETECTED NOT DETECTED Final   Coronavirus 229E NOT DETECTED NOT DETECTED Final    Comment: (NOTE) The Coronavirus on the Respiratory Panel, DOES NOT test for the novel  Coronavirus (2019 nCoV)    Coronavirus HKU1 NOT DETECTED NOT DETECTED Final   Coronavirus NL63 NOT DETECTED NOT DETECTED Final   Coronavirus OC43 NOT DETECTED NOT DETECTED Final   Metapneumovirus NOT DETECTED NOT DETECTED Final   Rhinovirus / Enterovirus NOT DETECTED NOT DETECTED Final   Influenza A NOT DETECTED NOT DETECTED Final   Influenza B NOT DETECTED NOT DETECTED Final   Parainfluenza Virus 1 NOT DETECTED NOT DETECTED Final   Parainfluenza Virus 2 NOT DETECTED NOT DETECTED Final   Parainfluenza Virus 3 NOT DETECTED NOT DETECTED Final   Parainfluenza Virus 4 NOT DETECTED NOT DETECTED Final   Respiratory Syncytial Virus NOT DETECTED NOT DETECTED Final   Bordetella pertussis NOT DETECTED NOT DETECTED Final   Bordetella Parapertussis NOT DETECTED NOT DETECTED Final   Chlamydophila pneumoniae NOT DETECTED NOT DETECTED Final   Mycoplasma pneumoniae NOT DETECTED NOT DETECTED Final    Comment: Performed at [facility], [place]. [address] [place], [place] [address]   Culture, Respiratory w Gram Stain     Status: None (Preliminary result)   Collection Time: 12/20/21  4:26 PM   Specimen: Bronchoalveolar Lavage; Respiratory  Result Value Ref Range Status   Specimen Description   Final    BRONCHIAL ALVEOLAR LAVAGE Performed at [place] [address] [place], [place] [address]    Special Requests   Final    NONE Performed at [facility], [place] [address] [place], [place] [address]    Gram Stain NO WBC SEEN NO ORGANISMS SEEN   Final   Culture   Final    NO GROWTH < 24 HOURS Performed at [facility], [place] [address], [place], [place] [address]    Report Status PENDING  Incomplete  Radiology Studies: DG CHEST PORT 1 VIEW  Result Date: 12/21/2021 CLINICAL DATA:  Acute on chronic respiratory failure EXAM: PORTABLE CHEST 1 VIEW COMPARISON:  Chest radiograph dated 12/20/2021, CTA chest dated 12/19/2021 FINDINGS: Improved lung aeration. Decreased but persistent patchy and interstitial opacities throughout both lungs. Decreased areas of more confluent opacities in the left greater than right lung. No definite pleural effusion or pneumothorax. The heart size and mediastinal contours are within normal limits. The visualized skeletal structures are unremarkable. IMPRESSION: Improved lung aeration with decreased but persistent patchy and interstitial opacities throughout both lungs. Electronically Signed   By: Darrin Nipper M.D.   On: 12/21/2021 10:20   DG CHEST PORT 1 VIEW  Result Date: 12/20/2021 CLINICAL DATA:  Respiratory distress EXAM: PORTABLE CHEST 1 VIEW COMPARISON:  Chest x-ray dated December 17, 2021 FINDINGS: The heart size and mediastinal contours are within normal limits. Diffuse heterogeneous opacities, increased when compared with prior chest x-ray. The visualized skeletal structures are unremarkable. IMPRESSION: Diffuse heterogeneous opacities, worsened when compared with prior radiograph,  likely due to pulmonary edema. Electronically Signed   By: Yetta Glassman M.D.   On: 12/20/2021 16:48   ECHOCARDIOGRAM COMPLETE  Result Date: 12/20/2021    ECHOCARDIOGRAM REPORT   Patient Name:   Erin [name] Date of Exam: 12/20/2021 Medical Rec #:  [id number]              Height:       69.0 in Accession #:    [id number]             Weight:       202.0 lb Date of Birth:  1950/05/04              BSA:          2.075 m Patient Age:    44 years               BP:           141/61 mmHg Patient Gender: F                      HR:           77 bpm. Exam Location:  Inpatient Procedure: 2D Echo and Intracardiac Opacification Agent Indications:    CHF  History:        Patient has no prior history of Echocardiogram examinations.  Sonographer:    Harvie Junior Referring Phys: [phone] STEPHEN K CHIU  Sonographer Comments: Technically difficult study due to poor echo windows. IMPRESSIONS  1. Left ventricular ejection fraction, by estimation, is 60 to 65%. The left ventricle has normal function. The left ventricle has no regional wall motion abnormalities. Left ventricular diastolic parameters were normal.  2. Right ventricular systolic function is normal. The right ventricular size is normal. There is normal pulmonary artery systolic pressure.  3. The mitral valve is normal in structure. No evidence of mitral valve regurgitation. No evidence of mitral stenosis.  4. The aortic valve is normal in structure. Aortic valve regurgitation is not visualized. No aortic stenosis is present.  5. The inferior vena cava is normal in size with greater than 50% respiratory variability, suggesting right atrial pressure of 3 mmHg. FINDINGS  Left Ventricle: Left ventricular ejection fraction, by estimation, is 60 to 65%. The left ventricle has normal function. The left ventricle has no regional wall motion abnormalities. Definity contrast agent was given IV to delineate the left ventricular  endocardial borders. The left ventricular  internal cavity size was normal in size. There is no left ventricular hypertrophy. Left ventricular diastolic parameters were normal. Normal left ventricular filling pressure. Right Ventricle: The right ventricular size is normal. No increase in right ventricular wall thickness. Right ventricular systolic function is normal. There is normal pulmonary artery systolic pressure. The tricuspid regurgitant velocity is 2.44 m/s, and  with an assumed right atrial pressure of 3 mmHg, the estimated right ventricular systolic pressure is 83.4 mmHg. Left Atrium: Left atrial size was normal in size. Right Atrium: Right atrial size was normal in size. Pericardium: There is no evidence of pericardial effusion. Mitral Valve: The mitral valve is normal in structure. No evidence of mitral valve regurgitation. No evidence of mitral valve stenosis. Tricuspid Valve: The tricuspid valve is normal in structure. Tricuspid valve regurgitation is not demonstrated. No evidence of tricuspid stenosis. Aortic Valve: The aortic valve is normal in structure. Aortic valve regurgitation is not visualized. No aortic stenosis is present. Aortic valve mean gradient measures 3.0 mmHg. Aortic valve peak gradient measures 5.4 mmHg. Aortic valve area, by VTI measures 3.56 cm. Pulmonic Valve: The pulmonic valve was normal in structure. Pulmonic valve regurgitation is not visualized. No evidence of pulmonic stenosis. Aorta: The aortic root is normal in size and structure. Venous: The inferior vena cava is normal in size with greater than 50% respiratory variability, suggesting right atrial pressure of 3 mmHg. IAS/Shunts: No atrial level shunt detected by color flow Doppler.  LEFT VENTRICLE PLAX 2D LVIDd:         4.70 cm      Diastology LVIDs:         3.20 cm      LV e' medial:    12.10 cm/s LV PW:         1.00 cm      LV E/e' medial:  8.2 LV IVS:        1.00 cm      LV e' lateral:   11.30 cm/s LVOT diam:     2.00 cm      LV E/e' lateral: 8.8 LV SV:          75 LV SV Index:   36 LVOT Area:     3.14 cm  LV Volumes (MOD) LV vol d, MOD A2C: 112.0 ml LV vol d, MOD A4C: 139.0 ml LV vol s, MOD A2C: 39.3 ml LV vol s, MOD A4C: 44.4 ml LV SV MOD A2C:     72.7 ml LV SV MOD A4C:     139.0 ml LV SV MOD BP:      87.7 ml RIGHT VENTRICLE RV Basal diam:  3.30 cm RV Mid diam:    3.20 cm TAPSE (M-mode): 2.2 cm LEFT ATRIUM           Index        RIGHT ATRIUM           Index LA diam:      3.50 cm 1.69 cm/m   RA Area:     13.40 cm LA Vol (A2C): 29.5 ml 14.22 ml/m  RA Volume:   29.60 ml  14.27 ml/m LA Vol (A4C): 40.0 ml 19.28 ml/m  AORTIC VALVE                    PULMONIC VALVE AV Area (Vmax):    3.47 cm     PV Vmax:       1.28 m/s AV Area (Vmean):   3.42 cm  PV Peak grad:  6.6 mmHg AV Area (VTI):     3.56 cm AV Vmax:           116.00 cm/s AV Vmean:          83.200 cm/s AV VTI:            0.210 m AV Peak Grad:      5.4 mmHg AV Mean Grad:      3.0 mmHg LVOT Vmax:         128.00 cm/s LVOT Vmean:        90.700 cm/s LVOT VTI:          0.238 m LVOT/AV VTI ratio: 1.13  AORTA Ao Root diam: 3.10 cm Ao Asc diam:  3.20 cm MITRAL VALVE               TRICUSPID VALVE MV Area (PHT): 3.53 cm    TR Peak grad:   23.8 mmHg MV Decel Time: 215 msec    TR Vmax:        244.00 cm/s MR Peak grad: 23.9 mmHg MR Vmax:      244.50 cm/s  SHUNTS MV E velocity: 99.40 cm/s  Systemic VTI:  0.24 m MV A velocity: 78.40 cm/s  Systemic Diam: 2.00 cm MV E/A ratio:  1.27 Mihai Croitoru MD Electronically signed by Sanda Klein MD Signature Date/Time: 12/20/2021/1:06:55 PM    Final         Scheduled Meds:  atorvastatin  20 mg Oral QPM   buPROPion  150 mg Oral q morning   Chlorhexidine Gluconate Cloth  6 each Topical Daily   cloNIDine  0.1 mg Oral QHS   enoxaparin (LOVENOX) injection  40 mg Subcutaneous Q24H   FLUoxetine  40 mg Oral Daily   hydrOXYzine  25 mg Oral Daily   insulin aspart  0-15 Units Subcutaneous Q4H   lurasidone  40 mg Oral QHS   methylPREDNISolone (SOLU-MEDROL) injection  125 mg  Intravenous Q6H   nicotine  21 mg Transdermal Daily   mouth rinse  15 mL Mouth Rinse 4 times per day   sodium chloride flush  3 mL Intravenous Q12H   Continuous Infusions:     LOS: 5 days    Time spent: 40 minutes    Irine Seal, MD [facility]   To contact the attending provider between 7A-7P or the covering provider during after hours 7P-7A, please log into the web site [email or web address] and access using universal [place] password for that web site. If you do not have the password, please call the hospital operator.  12/22/2021, 9:11 AM

## 2021-12-23 DIAGNOSIS — J189 Pneumonia, unspecified organism: Secondary | ICD-10-CM | POA: Diagnosis not present

## 2021-12-23 DIAGNOSIS — A419 Sepsis, unspecified organism: Secondary | ICD-10-CM | POA: Diagnosis not present

## 2021-12-23 DIAGNOSIS — J9601 Acute respiratory failure with hypoxia: Secondary | ICD-10-CM | POA: Diagnosis not present

## 2021-12-23 DIAGNOSIS — S27309D Unspecified injury of lung, unspecified, subsequent encounter: Secondary | ICD-10-CM | POA: Diagnosis not present

## 2021-12-23 DIAGNOSIS — I10 Essential (primary) hypertension: Secondary | ICD-10-CM | POA: Diagnosis not present

## 2021-12-23 LAB — STREP PNEUMONIAE URINARY ANTIGEN: Strep Pneumo Urinary Antigen: NEGATIVE

## 2021-12-23 LAB — GLUCOSE, CAPILLARY
Glucose-Capillary: 139 mg/dL — ABNORMAL HIGH (ref 70–99)
Glucose-Capillary: 145 mg/dL — ABNORMAL HIGH (ref 70–99)
Glucose-Capillary: 117 mg/dL — ABNORMAL HIGH (ref 70–99)
Glucose-Capillary: 143 mg/dL — ABNORMAL HIGH (ref 70–99)
Glucose-Capillary: 184 mg/dL — ABNORMAL HIGH (ref 70–99)
Glucose-Capillary: 182 mg/dL — ABNORMAL HIGH (ref 70–99)

## 2021-12-23 LAB — BASIC METABOLIC PANEL
Anion gap: 7 (ref 5–15)
BUN: 29 mg/dL — ABNORMAL HIGH (ref 8–23)
CO2: 28 mmol/L (ref 22–32)
Calcium: 9.3 mg/dL (ref 8.9–10.3)
Chloride: 103 mmol/L (ref 98–111)
Creatinine, Ser: 0.56 mg/dL (ref 0.44–1.00)
GFR, Estimated: >60 mL/min (ref >60)
Glucose, Bld: 147 mg/dL — ABNORMAL HIGH (ref 70–99)
Potassium: 4.2 mmol/L (ref 3.5–5.1)
Sodium: 138 mmol/L (ref 135–145)

## 2021-12-23 LAB — CBC
HCT: 32.7 % — ABNORMAL LOW (ref 36.0–46.0)
Hemoglobin: 10.5 g/dL — ABNORMAL LOW (ref 12.0–15.0)
MCH: 28.7 pg (ref 26.0–34.0)
MCHC: 32.1 g/dL (ref 30.0–36.0)
MCV: 89.3 fL (ref 80.0–100.0)
Platelets: 433 10*3/uL — ABNORMAL HIGH (ref 150–400)
RBC: 3.66 MIL/uL — ABNORMAL LOW (ref 3.87–5.11)
RDW: 14.1 % (ref 11.5–15.5)
WBC: 13 10*3/uL — ABNORMAL HIGH (ref 4.0–10.5)
nRBC: 0 % (ref 0.0–0.2)

## 2021-12-23 LAB — CULTURE, RESPIRATORY W GRAM STAIN
Culture: NO GROWTH
Gram Stain: NONE SEEN

## 2021-12-23 LAB — PATHOLOGIST SMEAR REVIEW

## 2021-12-23 LAB — CULTURE, BLOOD (ROUTINE X 2)
Culture: NO GROWTH
Culture: NO GROWTH
Special Requests: ADEQUATE
Special Requests: ADEQUATE

## 2021-12-23 MED ORDER — INSULIN ASPART 100 UNIT/ML IJ SOLN
0.0000 [IU] | Freq: Three times a day (TID) | INTRAMUSCULAR | Status: DC
  Administered 2021-12-24: 3 [IU] via SUBCUTANEOUS
  Administered 2021-12-24: 2 [IU] via SUBCUTANEOUS
  Administered 2021-12-25: 5 [IU] via SUBCUTANEOUS
  Administered 2021-12-27: 3 [IU] via SUBCUTANEOUS

## 2021-12-23 NOTE — Progress Notes (Addendum)
NAME:  Erin [name], MRN:  [id number], DOB:  1950/10/29, LOS: 6 ADMISSION DATE:  12/17/2021, CONSULTATION DATE:  12/19/21 REFERRING MD:  Dr Wyline Copas of [facility] CHIEF COMPLAINT:  Acute hypoxemic resp failure,    BRIEF  71 year old "big time" smoker who is retired.  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 Suffers from depression, anxiety, previous history of alcohol abuse in remission currently on December 12, 2021 Monday her 62-year-old granddaughter got diagnosed with her first ever RSV infection (also had covid a month ago from day care).  She did have substantial exposure to this granddaughter.  Then on 12/15/2021 Thursday patient became ill abruptly and presented to the ER on 12/17/2021 with 2-day history of progressive cough and shortness of breath.  Found to be hypoxemic.  CT angiogram today ruled out pulmonary embolism but showed diffuse groundglass opacities raising concern for a broad list of differential diagnosis.  She is on antibacterial community-acquired pneumonia antibiotics with Rocephin and azithromycin but without improvement.  Requiring 15 L high flow nasal cannula and subjectively feeling okay and watching TV but is actually tachypneic.  She was febrile 101 Fahrenheit with a high white count consistent with SIRS physiology.  Concern for bacterial pneumonia/sepsis.  Given the presence of infiltrates and hypoxemia  Her blood eosinophils were high at 900 cells per cubic millimeter at admission.  Previously normal.  No prior history of asthma.  No mold exposure in the house.  No feather pillow or feather blankets in the house.  No pet birds in the house. Noted per daughte 12/20/21" old home . There could be mold. Dog in home urinates all over  Critical care medicine called for consultation.  Past Medical History:   has a past medical history of Alcohol abuse (11/08/2016), Alcoholism ([place]), Depression, and Hyperlipemia.   reports that she has been smoking cigarettes. She has been smoking an  average of 1 pack per day. She has never used smokeless tobacco. Significant Hospital Events:  12/17/2021 - admit 12/19/21 - ccm consult  - LDH 450  - CRP 25/ ESR 111  - BNP 107  - EOS 1200 (amdit 600)  - Quant GOld >> neg  - RVP 12/19/21 - NEG  - VASCULITIS PANEL >>  - SEROLOGY panel >>  - Urine strep/leg >>  - covid IgG - POSITIVE (2 year grand kid had covid 2 mo ago 12/20/21 Bronch BAL   -11% eos, 86% PMN NO ALV hge  - repeat RVP neg  - stARted solumedrol 120m Q6h  - 13L HFNC. Using BiPAP QHs and day time 1-2 times 12/21/2021 - Used bipap all night. This am down to 8L [place] and hungry. Afebrile. Feeling better  Interim History / Subjective:  Wore BiPAP overnight. Remains on 2L [place].   Objective   Blood pressure (!) 111/46, pulse (!) 56, temperature 97.9 F (36.6 C), temperature source Oral, resp. rate 18, weight 91.6 kg, SpO2 99 %.        Intake/Output Summary (Last 24 hours) at 12/23/2021 0833 Last data filed at 12/23/2021 0456 Gross per 24 hour  Intake --  Output 1330 ml  Net -1330 ml   Filed Weights   12/17/21 1657  Weight: 91.6 kg   Examination: General: Older adult female sitting in bed, no distress  HEENT: MM pink/moist Neuro: Alert, oriented, follows commands  CV: RRR, HR 65, no mRG PULM:  diminished to bases, no wheezes noted, no use of accessory muscles  GI: soft, active bowel sounds, non-tender  Extremities: -edema  Skin: no rashes    Resolved Hospital Problem list    Assessment & Plan:   Acute hypoxemic respiratory failure with pulmonary infiltrates - CT Chest with extensive bilateral crazy paving pattern characterized by extensive bilateral ground glass opacities and interlobular septal thickening  - BAL 11/7 Features c/w ALI - AIP with >80% PMN with AEP (11% eos) 2nd in ddx. - RVP x 2 neg including from BAL - little improvement with abx/ diureses - started on solumedrol 19m q 6 11/7 with continued rapid improvement  - normal echo 11/7 -  Quantiferon TB negative  - completed ceftriaxone/ doxy after 5 day course,  Tobacco abuse P:   - continue to wean O2 for sat goal > 88% (remains on 2L [place])  - BiPAP prn, likely will not need anymore - Urine strep, urine Legionella > re-ordered, still not collected  - monitor clinically off abx - Continue solumedrol 80 mg daily (just decreased from 125 daily 11/9, will need slow wean)  - aggressive pulmonary hygiene- IS/ PT - nicotine patch, smoking cessation  - will need output f/u with Dr. RJoan Floresper Primary team.  Pulmonary will continue to follow > Will see again Monday. If need assistance over weekend please page.   Best practice (daily eval):   per [facility]  Time Spent: 35 minutes   KHayden Pedro AGACNP-BC [facility]  PCCM Pgr: [phone] LABS    Latest Reference Range & Units 07/14/09 20:23 09/06/09 13:50 05/16/20 17:51 12/17/21 18:50  Eosinophils Absolute 0.0 - 0.5 K/uL 0.0 0.0 0.0 0.9 (H)  (H): Data is abnormally high PULMONARY Recent Labs  Lab 12/19/21 1843  PHART 7.52*  PCO2ART 35  PO2ART 67*  HCO3 28.6*  O2SAT 95.8    CBC Recent Labs  Lab 12/21/21 0327 12/22/21 0313 12/23/21 0300  HGB 10.9* 10.7* 10.5*  HCT 33.5* 32.2* 32.7*  WBC 8.7 13.9* 13.0*  PLT 442* 424* 433*    COAGULATION Recent Labs  Lab 12/18/21 0040  INR 1.2    CARDIAC  No results for input(s): "TROPONINI" in the last 168 hours. No results for input(s): "PROBNP" in the last 168 hours.  CHEMISTRY Recent Labs  Lab 12/18/21 0040 12/18/21 0600 12/19/21 0322 12/20/21 0333 12/21/21 0327 12/22/21 0313 12/23/21 0300  NA  --    < > 132* 133* 135 137 138  K  --    < > 4.0 3.9 4.4 4.0 4.2  CL  --    < > 100 97* 98 101 103  CO2  --    < > _0 GLUCOSE  --    < > 108* 113* 157* 144* 147*  BUN  --    < > _1 29* 29*  CREATININE  --    < > 0.57 0.67 0.64 0.65 0.56  CALCIUM  --    < > 8.7* 8.9 9.4 9.3 9.3  MG 1.9  --    --  1.9  --  2.1  --   PHOS  --   --   --  4.1  --   --   --    < > = values in this interval not displayed.   CrCl cannot be calculated (Unknown ideal weight.).   LIVER Recent Labs  Lab 12/17/21 1850 12/18/21 0040 12/18/21 0600 12/21/21 0327  AST 37  --  37 35  ALT 36  --  33 33  ALKPHOS 209*  --  202* 201*  BILITOT 0.9  --  0.8 0.6  PROT 6.7  --  6.1* 6.5  ALBUMIN 2.6*  --  2.3* 2.4*  INR  --  1.2  --   --      INFECTIOUS Recent Labs  Lab 12/18/21 0040 12/18/21 0600 12/19/21 0322 12/22/21 1339  LATICACIDVEN 1.3  --   --   --   PROCALCITON 0.18 0.13 0.13 <0.10     ENDOCRINE CBG (last 3)  Recent Labs    12/22/21 2316 12/23/21 0318 12/23/21 0813  GLUCAP 153* 145* 117*    IMAGING x48h  - image(s) personally visualized  -   highlighted in bold DG CHEST PORT 1 VIEW  Result Date: 12/21/2021 CLINICAL DATA:  Acute on chronic respiratory failure EXAM: PORTABLE CHEST 1 VIEW COMPARISON:  Chest radiograph dated 12/20/2021, CTA chest dated 12/19/2021 FINDINGS: Improved lung aeration. Decreased but persistent patchy and interstitial opacities throughout both lungs. Decreased areas of more confluent opacities in the left greater than right lung. No definite pleural effusion or pneumothorax. The heart size and mediastinal contours are within normal limits. The visualized skeletal structures are unremarkable. IMPRESSION: Improved lung aeration with decreased but persistent patchy and interstitial opacities throughout both lungs. Electronically Signed   By: Darrin Nipper M.D.   On: 12/21/2021 10:20

## 2021-12-23 NOTE — Progress Notes (Signed)
PROGRESS NOTE    Erin [name]  LGX:[id number] DOB: 1951-01-17 DOA: 12/17/2021 PCP: Aura Dials, PA-C    Chief Complaint  Patient presents with   Shortness of Breath    Brief Narrative:  71 y.o. female with medical history significant for depression, anxiety, and history of alcohol abuse in remission who presents emergency department for evaluation of cough and shortness of breath.   Patient initially sought care at an urgent care for 2 days of progressive cough and shortness of breath, was found to be hypoxic, and directed to the ED. In ED, pt was found to have xray findings worrisome for multifocal PNA with leukocytosis. Pt was started on abx. -CT angiogram chest done showed diffuse groundglass opacities raising concern for multiple differential diagnosis. -Due to worsening respiratory status and increased O2 requirements PCCM consulted. -Patient seen in consultation by PCCM and subsequently underwent bronchoscopy with BAL.  Patient due to concern for acute lung injury placed on IV Solu-Medrol with clinical improvement.   Assessment & Plan:   Principal Problem:   Sepsis due to pneumonia [facility]) Active Problems:   Acute respiratory failure with hypoxia (HCC)   Acute lung injury   Multifocal pneumonia   Depression   Hypokalemia   Essential hypertension   Prolonged QT interval   QT prolongation   Hypertension  #1 acute hypoxemic respiratory failure, POA secondary to ALI/ pneumonia -Patient noted to have presented with 2-day history of progressive cough, shortness of breath, noted to be hypoxemic. -CT angiogram negative for PE but did show diffuse groundglass opacities raising concern for a broad list of differential diagnosis. -Due to increased O2 requirements going up as high as 15 L high flow nasal cannula and required BiPAP PCCM consulted. 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 -Patient given a dose of IV Lasix (12/20/2021) with urine output not properly recorded -Patient started on high-dose IV Solu-Medrol 125 mg every 6 hours with clinical improvement and dose decreased to 80 mg daily today per pulmonary with O2 sats down to 2 L high flow nasal cannula today from BiPAP overnight. -Serology ordered per PCCM including vasculitis panel, QuantiFERON gold, urine Legionella antigen, urine strep pneumococcus antigen. -Doxycycline discontinued per PCCM and patient status post 5-day course of IV Rocephin.  -PCCM recommending BiPAP nightly and daytime 1-2 applications. -Per PCCM May DC daily BiPAP once down to < 6 L and continue BiPAP nightly and may DC nightly BiPAP once < 4 L O2 requirements. -PCCM recommending IV Solu-Medrol through and then to start slow oral prednisone taper 12/26/2021 and taper over a few weeks to few months. -Will need outpatient follow-up with PCCM. -PCCM following and appreciate input and recommendations.  2.  Severe sepsis due to pneumonia -Patient on admission met criteria for severe sepsis with multifocal pneumonia concerns, fever, leukocytosis, tachypnea, acute hypoxic respiratory failure and placed empirically on IV Rocephin and azithromycin in the ED. -Status post full course of antibiotic treatment. -Due to increased O2 requirement CT angiogram chest on 12/19/2021 negative for PE however notable for extensive be crazy paving pattern with be groundglass opacities and interlobular septal thickening. -2D echo done EF 60 to 65%,NWMA. -See problem #1 above.  3.  Hypertension -Stable.  Continue current regimen of clonidine.   4.  Depression/anxiety -Stable.   -Continue home regimen Prozac, Latuda, Wellbutrin, hydroxyzine.  Patient also placed on Ativan as needed as husband recently  passed a few days ago in the stepdown unit.   5.  Prolonged QT interval -QTc noted at 507 on  presentation in the ED. -Repeat EKG with RBBB with no significant change from prior EKG, some improvement with QTc. -Electrolytes repleted. -Potassium at 4.2 today. -Avoid QTc prolongation medications.  6.  Liver lesion -Incidental 5.3 cm right lobe lesion on CTA of chest noted. -Recommend nonemergent abdominal MRI once pulmonary issues have resolved which may be done in the outpatient setting.  DVT prophylaxis: Lovenox Code Status: Full Family Communication: Updated patient.  No family at bedside.  Disposition: Transfer to progressive care unit.  Likely home once clinically improved and cleared by pulmonary.    Status is: Inpatient Remains inpatient appropriate because: Severity of illness   Consultants:  PCCM: Dr. Chase Caller 12/19/2021  Procedures:  Flexible bronchoscopy, BAL of the lingula per PCCM, Dr. Chase Caller 12/20/2021 CT angiogram chest 12/19/2021 Chest x-ray 12/17/2021, 12/20/2021, 12/21/2021 2D echo 12/20/2021  Significant Hospital Events:  12/17/2021 - admit 12/19/21 - ccm consult             - LDH 450             - CRP 25/ ESR 111             - BNP 107             - EOS 1200 (amdit 600)             - Quant GOld >> negative             - RVP 12/19/21 - NEG             - VASCULITIS PANEL >>             - SEROLOGY panel >>             - Urine strep/leg >>             - covid IgG - POSITIVE (2 year grand kid had covid 2 mo ago)   12/20/21 Bronch BAL              -11% eos, 86% PMN NO ALV hge             - repeat RVP neg             - stARted solumedrol 178m Q6h               - 13L HFNC. Using BiPAP QHs and day time 1-2 times  Antimicrobials:  Oral Azithromycin 12/17/2021 x 1 dose IV Rocephin 12/17/2021>>>> 12/22/2021 Doxycycline 12/18/2021>>>> 12/21/2021   Subjective: Patient sitting up in bed.  States overall she is continues to feel better.  Noted to have been on BiPAP overnight currently on 2 L nasal cannula.  Denies any chest pain.    Objective: Vitals:    12/23/21 0600 12/23/21 0822 12/23/21 0928 12/23/21 0932  BP: (!) 111/46     Pulse: (!) 56     Resp: 18     Temp:  97.9 F (36.6 C)    TempSrc:  Oral    SpO2: 99%  96% 95%  Weight:        Intake/Output Summary (Last 24 hours) at 12/23/2021 1020 Last data filed at 12/23/2021 0456 Gross per 24 hour  Intake --  Output 680 ml  Net -680 ml    Filed Weights   12/17/21 1657  Weight: 91.6 kg    Examination:  General exam: NAD Respiratory  system: Scattered coarse breath sounds.  No wheezing.  Fair air movement.  No use of accessory muscles of respiration.  Speaking in full sentences.  Currently on 2 L nasal cannula with sats of 95%.  Cardiovascular system: Regular rate rhythm no murmurs rubs or gallops.  No JVD.  No lower extremity edema. Gastrointestinal system: Abdomen is soft, nontender, nondistended, positive bowel sounds.  No rebound.  No guarding.  Central nervous system: Alert and oriented. No focal neurological deficits. Extremities: Symmetric 5 x 5 power. Skin: No rashes, lesions or ulcers Psychiatry: Judgement and insight appear normal. Mood & affect appropriate.     Data Reviewed: I have personally reviewed following labs and imaging studies  CBC: Recent Labs  Lab 12/17/21 1850 12/18/21 0600 12/19/21 1855 12/20/21 0333 12/21/21 0327 12/22/21 0313 12/23/21 0300  WBC 12.8*   < > 13.6* 12.1* 8.7 13.9* 13.0*  NEUTROABS 9.5*  --  10.3*  --   --  12.2*  --   HGB 11.2*   < > 11.1* 10.6* 10.9* 10.7* 10.5*  HCT 33.0*   < > 34.0* 32.1* 33.5* 32.2* 32.7*  MCV 87.5   < > 88.5 87.7 88.4 86.8 89.3  PLT 409*   < > 429* 389 442* 424* 433*   < > = values in this interval not displayed.     Basic Metabolic Panel: Recent Labs  Lab 12/18/21 0040 12/18/21 0600 12/19/21 0322 12/20/21 0333 12/21/21 0327 12/22/21 0313 12/23/21 0300  NA  --    < > 132* 133* 135 137 138  K  --    < > 4.0 3.9 4.4 4.0 4.2  CL  --    < > 100 97* 98 101 103  CO2  --    < > _0 GLUCOSE  --    < > 108* 113* 157* 144* 147*  BUN  --    < > _1 29* 29*  CREATININE  --    < > 0.57 0.67 0.64 0.65 0.56  CALCIUM  --    < > 8.7* 8.9 9.4 9.3 9.3  MG 1.9  --   --  1.9  --  2.1  --   PHOS  --   --   --  4.1  --   --   --    < > = values in this interval not displayed.     GFR: CrCl cannot be calculated (Unknown ideal weight.).  Liver Function Tests: Recent Labs  Lab 12/17/21 1850 12/18/21 0600 12/21/21 0327  AST 37 37 35  ALT 36 33 33  ALKPHOS 209* 202* 201*  BILITOT 0.9 0.8 0.6  PROT 6.7 6.1* 6.5  ALBUMIN 2.6* 2.3* 2.4*     CBG: Recent Labs  Lab 12/22/21 1633 12/22/21 1926 12/22/21 2316 12/23/21 0318 12/23/21 0813  GLUCAP 223* 139* 153* 145* 117*      Recent Results (from the past 240 hour(s))  Resp Panel by RT-PCR (Flu A&B, Covid) Anterior Nasal Swab     Status: None   Collection Time: 12/17/21  6:24 PM   Specimen: Anterior Nasal Swab  Result Value Ref Range Status   SARS Coronavirus 2 by RT PCR NEGATIVE NEGATIVE Final    Comment: (NOTE) SARS-CoV-2 target nucleic acids are NOT DETECTED.  The SARS-CoV-2 RNA is generally detectable in upper respiratory specimens during the acute phase of infection. The lowest concentration of SARS-CoV-2 viral copies this assay can detect is 138 copies/mL. A  negative result does not preclude SARS-Cov-2 infection and should not be used as the sole basis for treatment or other patient management decisions. A negative result may occur with  improper specimen collection/handling, submission of specimen other than nasopharyngeal swab, presence of viral mutation(s) within the areas targeted by this assay, and inadequate number of viral copies(<138 copies/mL). A negative result must be combined with clinical observations, patient history, and epidemiological information. The expected result is Negative.  Fact Sheet for Patients:  EntrepreneurPulse.com.au  Fact Sheet for Healthcare  Providers:  IncredibleEmployment.be  This test is no t yet approved or cleared by the [place] FDA and  has been authorized for detection and/or diagnosis of SARS-CoV-2 by FDA under an Emergency Use Authorization (EUA). This EUA will remain  in effect (meaning this test can be used) for the duration of the COVID-19 declaration under Section 564(b)(1) of the Act, 21 U.S.C.section 360bbb-3(b)(1), unless the authorization is terminated  or revoked sooner.       Influenza A by PCR NEGATIVE NEGATIVE Final   Influenza B by PCR NEGATIVE NEGATIVE Final    Comment: (NOTE) The Xpert Xpress SARS-CoV-2/FLU/RSV plus assay is intended as an aid in the diagnosis of influenza from Nasopharyngeal swab specimens and should not be used as a sole basis for treatment. Nasal washings and aspirates are unacceptable for Xpert Xpress SARS-CoV-2/FLU/RSV testing.  Fact Sheet for Patients: EntrepreneurPulse.com.au  Fact Sheet for Healthcare Providers: IncredibleEmployment.be  This test is not yet approved or cleared by the [place] FDA and has been authorized for detection and/or diagnosis of SARS-CoV-2 by FDA under an Emergency Use Authorization (EUA). This EUA will remain in effect (meaning this test can be used) for the duration of the COVID-19 declaration under Section 564(b)(1) of the Act, 21 U.S.C. section 360bbb-3(b)(1), unless the authorization is terminated or revoked.  Performed at [facility], [place] [address], [place], [place] [address]   Culture, blood (x 2)     Status: None   Collection Time: 12/18/21 12:40 AM   Specimen: BLOOD  Result Value Ref Range Status   Specimen Description   Final    BLOOD BLOOD RIGHT FOREARM Performed at [facility] [address] [place], [place] [address]    Special Requests   Final    BOTTLES DRAWN AEROBIC AND ANAEROBIC Blood Culture adequate  volume Performed at [facility] [address], [place], [place] [address]    Culture   Final    NO GROWTH 5 DAYS Performed at [facility], [place] [address], [place], [place] [address]    Report Status 12/23/2021 FINAL  Final  Culture, blood (x 2)     Status: None   Collection Time: 12/18/21 12:40 AM   Specimen: BLOOD  Result Value Ref Range Status   Specimen Description   Final    BLOOD BLOOD RIGHT HAND Performed at [facility] [address] [place], [place] [address]    Special Requests   Final    BOTTLES DRAWN AEROBIC AND ANAEROBIC Blood Culture adequate volume Performed at [facility] [address], [place], [place] [address]    Culture   Final    NO GROWTH 5 DAYS Performed at [facility], [place] [address], [place],  [address]    Report Status 12/23/2021 FINAL  Final  MRSA Next Gen by PCR, Nasal     Status: None   Collection Time: 12/18/21  2:20 PM  Specimen: Nasal Mucosa; Nasal Swab  Result Value Ref Range Status   MRSA by PCR Next Gen NOT DETECTED NOT DETECTED Final    Comment: (NOTE) The GeneXpert MRSA Assay (FDA approved for NASAL specimens only), is one component of a comprehensive MRSA colonization surveillance program. It is not intended to diagnose MRSA infection nor to guide or monitor treatment for MRSA infections. Test performance is not FDA approved in patients less than 51 years old. Performed at [facility], [place] [address] [place], [place] [address]   Respiratory (~20 pathogens) panel by PCR     Status: None   Collection Time: 12/19/21  6:30 PM   Specimen: Nasopharyngeal Swab; Respiratory  Result Value Ref Range Status   Adenovirus NOT DETECTED NOT DETECTED Final   Coronavirus 229E NOT DETECTED NOT DETECTED Final    Comment: (NOTE) The Coronavirus on the Respiratory Panel, DOES NOT test for the novel  Coronavirus (2019 nCoV)    Coronavirus HKU1  NOT DETECTED NOT DETECTED Final   Coronavirus NL63 NOT DETECTED NOT DETECTED Final   Coronavirus OC43 NOT DETECTED NOT DETECTED Final   Metapneumovirus NOT DETECTED NOT DETECTED Final   Rhinovirus / Enterovirus NOT DETECTED NOT DETECTED Final   Influenza A NOT DETECTED NOT DETECTED Final   Influenza B NOT DETECTED NOT DETECTED Final   Parainfluenza Virus 1 NOT DETECTED NOT DETECTED Final   Parainfluenza Virus 2 NOT DETECTED NOT DETECTED Final   Parainfluenza Virus 3 NOT DETECTED NOT DETECTED Final   Parainfluenza Virus 4 NOT DETECTED NOT DETECTED Final   Respiratory Syncytial Virus NOT DETECTED NOT DETECTED Final   Bordetella pertussis NOT DETECTED NOT DETECTED Final   Bordetella Parapertussis NOT DETECTED NOT DETECTED Final   Chlamydophila pneumoniae NOT DETECTED NOT DETECTED Final   Mycoplasma pneumoniae NOT DETECTED NOT DETECTED Final    Comment: Performed at [facility], [place]. [address] [place], [place] [address]  Respiratory (~20 pathogens) panel by PCR     Status: None   Collection Time: 12/20/21  4:26 PM   Specimen: Bronchoalveolar Lavage; Respiratory  Result Value Ref Range Status   Adenovirus NOT DETECTED NOT DETECTED Final   Coronavirus 229E NOT DETECTED NOT DETECTED Final    Comment: (NOTE) The Coronavirus on the Respiratory Panel, DOES NOT test for the novel  Coronavirus (2019 nCoV)    Coronavirus HKU1 NOT DETECTED NOT DETECTED Final   Coronavirus NL63 NOT DETECTED NOT DETECTED Final   Coronavirus OC43 NOT DETECTED NOT DETECTED Final   Metapneumovirus NOT DETECTED NOT DETECTED Final   Rhinovirus / Enterovirus NOT DETECTED NOT DETECTED Final   Influenza A NOT DETECTED NOT DETECTED Final   Influenza B NOT DETECTED NOT DETECTED Final   Parainfluenza Virus 1 NOT DETECTED NOT DETECTED Final   Parainfluenza Virus 2 NOT DETECTED NOT DETECTED Final   Parainfluenza Virus 3 NOT DETECTED NOT DETECTED Final   Parainfluenza Virus 4 NOT DETECTED NOT DETECTED Final    Respiratory Syncytial Virus NOT DETECTED NOT DETECTED Final   Bordetella pertussis NOT DETECTED NOT DETECTED Final   Bordetella Parapertussis NOT DETECTED NOT DETECTED Final   Chlamydophila pneumoniae NOT DETECTED NOT DETECTED Final   Mycoplasma pneumoniae NOT DETECTED NOT DETECTED Final    Comment: Performed at [facility], [place]. [address] [place], [place] [address]  Culture, Respiratory w Gram Stain     Status: None (Preliminary result)   Collection Time: 12/20/21  4:26 PM   Specimen: Bronchoalveolar Lavage; Respiratory  Result  Value Ref Range Status   Specimen Description   Final    BRONCHIAL ALVEOLAR LAVAGE Performed at [facility] [address] [place], [place] [address]    Special Requests   Final    NONE Performed at [facility], [place] [address], [place], [place] [address]    Gram Stain NO WBC SEEN NO ORGANISMS SEEN   Final   Culture   Final    CULTURE REINCUBATED FOR BETTER GROWTH Performed at [facility], [place] [address] [place], [place] [address]    Report Status PENDING  Incomplete         Radiology Studies: No results found.      Scheduled Meds:  atorvastatin  20 mg Oral QPM   buPROPion  150 mg Oral q morning   Chlorhexidine Gluconate Cloth  6 each Topical Daily   cloNIDine  0.1 mg Oral QHS   enoxaparin (LOVENOX) injection  40 mg Subcutaneous Q24H   FLUoxetine  40 mg Oral Daily   hydrOXYzine  25 mg Oral Daily   insulin aspart  0-15 Units Subcutaneous Q4H   lurasidone  40 mg Oral QHS   methylPREDNISolone (SOLU-MEDROL) injection  80 mg Intravenous Daily   nicotine  21 mg Transdermal Daily   mouth rinse  15 mL Mouth Rinse 4 times per day   sodium chloride flush  3 mL Intravenous Q12H   Continuous Infusions:     LOS: 6 days    Time spent: 40 minutes    Irine Seal, MD [facility]   To contact the attending provider between 7A-7P or the covering provider during after hours  7P-7A, please log into the web site [email or web address] and access using universal Day password for that web site. If you do not have the password, please call the hospital operator.  12/23/2021, 10:20 AM

## 2021-12-23 NOTE — TOC Progression Note (Signed)
Transition of Care [facility]) - Progression Note    Patient Details  Name: Erin [name] MRN: [id number] Date of Birth: 05/03/1950  [facility] CM/SW Contact  Adrian Prows, RN Phone Number: 12/23/2021, 3:39 PM  Clinical Narrative:    Pt not ready for d/c; remains on O2; CCM consulted; TOC will con't to follow.   Expected Discharge Plan: Home/Self Care Barriers to Discharge: Continued Medical Work up  Expected Discharge Plan and Services Expected Discharge Plan: Home/Self Care   Discharge Planning Services: CM Consult   Living arrangements for the past 2 months: Single Family Home                                       Social Determinants of Health (SDOH) Interventions    Readmission Risk Interventions     No data to display

## 2021-12-23 NOTE — Progress Notes (Signed)
Chaplain was present as Erin [name] was being told the news of her husbands death.  Chaplain provided immediate grief support until evening chaplain arrived.  [address], Bcc Pager, [phone]

## 2021-12-23 NOTE — Progress Notes (Signed)
No need of bipap at this time.  

## 2021-12-24 DIAGNOSIS — J189 Pneumonia, unspecified organism: Secondary | ICD-10-CM | POA: Diagnosis not present

## 2021-12-24 DIAGNOSIS — S27309D Unspecified injury of lung, unspecified, subsequent encounter: Secondary | ICD-10-CM | POA: Diagnosis not present

## 2021-12-24 DIAGNOSIS — I10 Essential (primary) hypertension: Secondary | ICD-10-CM | POA: Diagnosis not present

## 2021-12-24 DIAGNOSIS — J9601 Acute respiratory failure with hypoxia: Secondary | ICD-10-CM | POA: Diagnosis not present

## 2021-12-24 LAB — GLUCOSE, CAPILLARY
Glucose-Capillary: 85 mg/dL (ref 70–99)
Glucose-Capillary: 148 mg/dL — ABNORMAL HIGH (ref 70–99)
Glucose-Capillary: 181 mg/dL — ABNORMAL HIGH (ref 70–99)
Glucose-Capillary: 114 mg/dL — ABNORMAL HIGH (ref 70–99)

## 2021-12-24 LAB — BASIC METABOLIC PANEL
Anion gap: 5 (ref 5–15)
BUN: 22 mg/dL (ref 8–23)
CO2: 25 mmol/L (ref 22–32)
Calcium: 8.6 mg/dL — ABNORMAL LOW (ref 8.9–10.3)
Chloride: 106 mmol/L (ref 98–111)
Creatinine, Ser: 0.6 mg/dL (ref 0.44–1.00)
GFR, Estimated: >60 mL/min (ref >60)
Glucose, Bld: 102 mg/dL — ABNORMAL HIGH (ref 70–99)
Potassium: 5 mmol/L (ref 3.5–5.1)
Sodium: 136 mmol/L (ref 135–145)

## 2021-12-24 LAB — CBC WITH DIFFERENTIAL/PLATELET
Abs Immature Granulocytes: 0.08 10*3/uL — ABNORMAL HIGH (ref 0.00–0.07)
Basophils Absolute: 0 10*3/uL (ref 0.0–0.1)
Basophils Relative: 0 %
Eosinophils Absolute: 0.1 10*3/uL (ref 0.0–0.5)
Eosinophils Relative: 1 %
HCT: 34.5 % — ABNORMAL LOW (ref 36.0–46.0)
Hemoglobin: 10.8 g/dL — ABNORMAL LOW (ref 12.0–15.0)
Immature Granulocytes: 1 %
Lymphocytes Relative: 28 %
Lymphs Abs: 2.9 10*3/uL (ref 0.7–4.0)
MCH: 28.7 pg (ref 26.0–34.0)
MCHC: 31.3 g/dL (ref 30.0–36.0)
MCV: 91.8 fL (ref 80.0–100.0)
Monocytes Absolute: 0.8 10*3/uL (ref 0.1–1.0)
Monocytes Relative: 7 %
Neutro Abs: 6.7 10*3/uL (ref 1.7–7.7)
Neutrophils Relative %: 63 %
Platelets: 310 10*3/uL (ref 150–400)
RBC: 3.76 MIL/uL — ABNORMAL LOW (ref 3.87–5.11)
RDW: 14.4 % (ref 11.5–15.5)
WBC: 10.5 10*3/uL (ref 4.0–10.5)
nRBC: 0 % (ref 0.0–0.2)

## 2021-12-24 LAB — MAGNESIUM: Magnesium: 2.5 mg/dL — ABNORMAL HIGH (ref 1.7–2.4)

## 2021-12-24 MED ORDER — POLYETHYLENE GLYCOL 3350 17 G PO PACK: 17.0000 g | PACK | Freq: Two times a day (BID) | ORAL | Status: DC

## 2021-12-24 MED ORDER — SENNOSIDES-DOCUSATE SODIUM 8.6-50 MG PO TABS
1.0000 | ORAL_TABLET | Freq: Two times a day (BID) | ORAL | Status: DC
  Filled 2021-12-24 (×6): qty 1

## 2021-12-24 MED ORDER — POLYETHYLENE GLYCOL 3350 17 G PO PACK: 17.0000 g | PACK | Freq: Every day | ORAL | Status: DC | PRN

## 2021-12-24 MED ORDER — ORAL CARE MOUTH RINSE: 15.0000 mL | OROMUCOSAL | Status: DC | PRN

## 2021-12-24 NOTE — Progress Notes (Signed)
PROGRESS NOTE    Erin [name]  ERX:[id number] DOB: May 07, 1950 DOA: 12/17/2021 PCP: Aura Dials, PA-C    Chief Complaint  Patient presents with   Shortness of Breath    Brief Narrative:  71 y.o. female with medical history significant for depression, anxiety, and history of alcohol abuse in remission who presents emergency department for evaluation of cough and shortness of breath.   Patient initially sought care at an urgent care for 2 days of progressive cough and shortness of breath, was found to be hypoxic, and directed to the ED. In ED, pt was found to have xray findings worrisome for multifocal PNA with leukocytosis. Pt was started on abx. -CT angiogram chest done showed diffuse groundglass opacities raising concern for multiple differential diagnosis. -Due to worsening respiratory status and increased O2 requirements PCCM consulted. -Patient seen in consultation by PCCM and subsequently underwent bronchoscopy with BAL.  Patient due to concern for acute lung injury placed on IV Solu-Medrol with clinical improvement.   Assessment & Plan:   Principal Problem:   Sepsis due to pneumonia [facility]) Active Problems:   Acute respiratory failure with hypoxia (HCC)   Acute lung injury   Multifocal pneumonia   Depression   Hypokalemia   Essential hypertension   Prolonged QT interval   QT prolongation   Hypertension  #1 acute hypoxemic respiratory failure, POA secondary to ALI/ pneumonia -Patient noted to have presented with 2-day history of progressive cough, shortness of breath, noted to be hypoxemic. -CT angiogram negative for PE but did show diffuse groundglass opacities raising concern for a broad list of differential diagnosis. -Due to increased O2 requirements going up as high as 15 L high flow nasal cannula and required BiPAP PCCM consulted. 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 -Patient given a dose of IV Lasix (12/20/2021) with urine output not properly recorded -Patient started on high-dose IV Solu-Medrol 125 mg every 6 hours with clinical improvement and dose decreased to 80 mg daily (11/10) per pulmonary with O2 sats down to 2.5 L high flow nasal cannula today.  Did not require BiPAP overnight.  -Serology ordered per PCCM including vasculitis panel, QuantiFERON gold, urine Legionella antigen, urine strep pneumococcus antigen. -Doxycycline discontinued per PCCM and patient status post 5-day course of IV Rocephin.  -PCCM recommending BiPAP nightly and daytime 1-2 applications. -Per PCCM May DC daily BiPAP once down to < 6 L and continue BiPAP nightly and may DC nightly BiPAP once < 4 L O2 requirements. -PCCM recommending IV Solu-Medrol through the weekend, and then to start slow oral prednisone taper 12/26/2021 and taper over a few weeks to few months. -Will need outpatient follow-up with PCCM. -PCCM following and appreciate input and recommendations.  2.  Severe sepsis due to pneumonia -Patient on admission met criteria for severe sepsis with multifocal pneumonia concerns, fever, leukocytosis, tachypnea, acute hypoxic respiratory failure and placed empirically on IV Rocephin and azithromycin in the ED. -Status post full course of antibiotic treatment. -Due to increased O2 requirement CT angiogram chest on 12/19/2021 negative for PE however notable for extensive be crazy paving pattern with be groundglass opacities and interlobular septal thickening. -2D echo done EF 60 to 65%,NWMA. -See problem #1 above.  3.  Hypertension -Stable.  Continue current regimen of clonidine.   4.  Depression/anxiety -Stable.   -Continue home regimen Prozac, Latuda, Wellbutrin, hydroxyzine.  Patient also placed on  Ativan as needed as husband recently passed a few days ago in the stepdown unit.   5.  Prolonged QT  interval -QTc noted at 507 on presentation in the ED. -Repeat EKG with RBBB with no significant change from prior EKG, some improvement with QTc. -Electrolytes repleted. -Potassium at 5 today. -Avoid QTc prolongation medications.  6.  Liver lesion -Incidental 5.3 cm right lobe lesion on CTA of chest noted. -Recommend nonemergent abdominal MRI once pulmonary issues have resolved which may be done in the outpatient setting.  DVT prophylaxis: Lovenox Code Status: Full Family Communication: Updated patient.  No family at bedside.  Disposition: Awaiting transfer to progressive care unit.    Status is: Inpatient Remains inpatient appropriate because: Severity of illness   Consultants:  PCCM: Dr. Chase Caller 12/19/2021  Procedures:  Flexible bronchoscopy, BAL of the lingula per PCCM, Dr. Chase Caller 12/20/2021 CT angiogram chest 12/19/2021 Chest x-ray 12/17/2021, 12/20/2021, 12/21/2021 2D echo 12/20/2021  Significant Hospital Events:  12/17/2021 - admit 12/19/21 - ccm consult             - LDH 450             - CRP 25/ ESR 111             - BNP 107             - EOS 1200 (amdit 600)             - Quant GOld >> negative             - RVP 12/19/21 - NEG             - VASCULITIS PANEL >>             - SEROLOGY panel >>             - Urine strep/leg >>             - covid IgG - POSITIVE (2 year grand kid had covid 2 mo ago)   12/20/21 Bronch BAL              -11% eos, 86% PMN NO ALV hge             - repeat RVP neg             - stARted solumedrol 197m Q6h               - 13L HFNC. Using BiPAP QHs and day time 1-2 times  Antimicrobials:  Oral Azithromycin 12/17/2021 x 1 dose IV Rocephin 12/17/2021>>>> 12/22/2021 Doxycycline 12/18/2021>>>> 12/21/2021   Subjective: Sitting up in bed.  Overall feeling better.  States able to take deeper breaths.  No chest pain.  No shortness of breath.  Noted to have not required BiPAP overnight.    Objective: Vitals:   12/24/21 0033 12/24/21 0200 12/24/21  0400 12/24/21 0800  BP:  (!) 110/44 (!) 97/48   Pulse:  63 60   Resp:  20 20   Temp: 97.8 F (36.6 C)  98 F (36.7 C) 98.2 F (36.8 C)  TempSrc: Oral   Oral  SpO2:  98% 97%   Weight:        Intake/Output Summary (Last 24 hours) at 12/24/2021 1013 Last data filed at 12/24/2021 0550 Gross per 24 hour  Intake 220 ml  Output 551 ml  Net -331 ml    Filed Weights   12/17/21 1657  Weight: 91.6 kg    Examination:  General  exam: NAD Respiratory system: Decreased cardiac coarse breath sounds/crackles.  Speaking in full sentences.  No use of accessory muscles of respiration.  Currently on 2.5 L high flow nasal cannula with sats greater than 95%.  Cardiovascular system: RRR no murmurs rubs or gallops.  No JVD.  No lower extremity edema.  Gastrointestinal system: Abdomen is soft, nontender, nondistended, positive bowel sounds.  No rebound.  No guarding. Central nervous system: Alert and oriented. No focal neurological deficits. Extremities: Symmetric 5 x 5 power. Skin: No rashes, lesions or ulcers Psychiatry: Judgement and insight appear normal. Mood & affect appropriate.     Data Reviewed: I have personally reviewed following labs and imaging studies  CBC: Recent Labs  Lab 12/17/21 1850 12/18/21 0600 12/19/21 1855 12/20/21 0333 12/21/21 0327 12/22/21 0313 12/23/21 0300 12/24/21 0335  WBC 12.8*   < > 13.6* 12.1* 8.7 13.9* 13.0* 10.5  NEUTROABS 9.5*  --  10.3*  --   --  12.2*  --  6.7  HGB 11.2*   < > 11.1* 10.6* 10.9* 10.7* 10.5* 10.8*  HCT 33.0*   < > 34.0* 32.1* 33.5* 32.2* 32.7* 34.5*  MCV 87.5   < > 88.5 87.7 88.4 86.8 89.3 91.8  PLT 409*   < > 429* 389 442* 424* 433* 310   < > = values in this interval not displayed.     Basic Metabolic Panel: Recent Labs  Lab 12/18/21 0040 12/18/21 0600 12/20/21 0333 12/21/21 0327 12/22/21 0313 12/23/21 0300 12/24/21 0335  NA  --    < > 133* 135 137 138 136  K  --    < > 3.9 4.4 4.0 4.2 5.0  CL  --    < > 97* 98 101  103 106  CO2  --    < > _0 GLUCOSE  --    < > 113* 157* 144* 147* 102*  BUN  --    < > 13 19 29* 29* 22  CREATININE  --    < > 0.67 0.64 0.65 0.56 0.60  CALCIUM  --    < > 8.9 9.4 9.3 9.3 8.6*  MG 1.9  --  1.9  --  2.1  --  2.5*  PHOS  --   --  4.1  --   --   --   --    < > = values in this interval not displayed.     GFR: CrCl cannot be calculated (Unknown ideal weight.).  Liver Function Tests: Recent Labs  Lab 12/17/21 1850 12/18/21 0600 12/21/21 0327  AST 37 37 35  ALT 36 33 33  ALKPHOS 209* 202* 201*  BILITOT 0.9 0.8 0.6  PROT 6.7 6.1* 6.5  ALBUMIN 2.6* 2.3* 2.4*     CBG: Recent Labs  Lab 12/23/21 0813 12/23/21 1143 12/23/21 1623 12/23/21 1934 12/24/21 0806  GLUCAP 117* 143* 184* 182* 85      Recent Results (from the past 240 hour(s))  Resp Panel by RT-PCR (Flu A&B, Covid) Anterior Nasal Swab     Status: None   Collection Time: 12/17/21  6:24 PM   Specimen: Anterior Nasal Swab  Result Value Ref Range Status   SARS Coronavirus 2 by RT PCR NEGATIVE NEGATIVE Final    Comment: (NOTE) SARS-CoV-2 target nucleic acids are NOT DETECTED.  The SARS-CoV-2 RNA is generally detectable in upper respiratory specimens during the acute phase of infection. The lowest concentration of SARS-CoV-2 viral copies this assay can detect  is 138 copies/mL. A negative result does not preclude SARS-Cov-2 infection and should not be used as the sole basis for treatment or other patient management decisions. A negative result may occur with  improper specimen collection/handling, submission of specimen other than nasopharyngeal swab, presence of viral mutation(s) within the areas targeted by this assay, and inadequate number of viral copies(<138 copies/mL). A negative result must be combined with clinical observations, patient history, and epidemiological information. The expected result is Negative.  Fact Sheet for Patients:   EntrepreneurPulse.com.au  Fact Sheet for Healthcare Providers:  IncredibleEmployment.be  This test is no t yet approved or cleared by the [place] FDA and  has been authorized for detection and/or diagnosis of SARS-CoV-2 by FDA under an Emergency Use Authorization (EUA). This EUA will remain  in effect (meaning this test can be used) for the duration of the COVID-19 declaration under Section 564(b)(1) of the Act, 21 U.S.C.section 360bbb-3(b)(1), unless the authorization is terminated  or revoked sooner.       Influenza A by PCR NEGATIVE NEGATIVE Final   Influenza B by PCR NEGATIVE NEGATIVE Final    Comment: (NOTE) The Xpert Xpress SARS-CoV-2/FLU/RSV plus assay is intended as an aid in the diagnosis of influenza from Nasopharyngeal swab specimens and should not be used as a sole basis for treatment. Nasal washings and aspirates are unacceptable for Xpert Xpress SARS-CoV-2/FLU/RSV testing.  Fact Sheet for Patients: EntrepreneurPulse.com.au  Fact Sheet for Healthcare Providers: IncredibleEmployment.be  This test is not yet approved or cleared by the [place] FDA and has been authorized for detection and/or diagnosis of SARS-CoV-2 by FDA under an Emergency Use Authorization (EUA). This EUA will remain in effect (meaning this test can be used) for the duration of the COVID-19 declaration under Section 564(b)(1) of the Act, 21 U.S.C. section 360bbb-3(b)(1), unless the authorization is terminated or revoked.  Performed at [facility], [place] [address] [place], [place] [address]   Culture, blood (x 2)     Status: None   Collection Time: 12/18/21 12:40 AM   Specimen: BLOOD  Result Value Ref Range Status   Specimen Description   Final    BLOOD BLOOD RIGHT FOREARM Performed at [place] [address] [place], [place] [address]    Special Requests   Final     BOTTLES DRAWN AEROBIC AND ANAEROBIC Blood Culture adequate volume Performed at [place] [address] [place], [place] [address]    Culture   Final    NO GROWTH 5 DAYS Performed at [facility], [place] [address] [place], [place] [address]    Report Status 12/23/2021 FINAL  Final  Culture, blood (x 2)     Status: None   Collection Time: 12/18/21 12:40 AM   Specimen: BLOOD  Result Value Ref Range Status   Specimen Description   Final    BLOOD BLOOD RIGHT HAND Performed at [place] [address], [place], [place] [address]    Special Requests   Final    BOTTLES DRAWN AEROBIC AND ANAEROBIC Blood Culture adequate volume Performed at [facility] [address], [place], [place] [address]    Culture   Final    NO GROWTH 5 DAYS Performed at [facility], [place] [address] [place], [place] [address]    Report Status 12/23/2021 FINAL  Final  MRSA Next Gen by PCR, Nasal     Status: None   Collection Time:  12/18/21  2:20 PM   Specimen: Nasal Mucosa; Nasal Swab  Result Value Ref Range Status   MRSA by PCR Next Gen NOT DETECTED NOT DETECTED Final    Comment: (NOTE) The GeneXpert MRSA Assay (FDA approved for NASAL specimens only), is one component of a comprehensive MRSA colonization surveillance program. It is not intended to diagnose MRSA infection nor to guide or monitor treatment for MRSA infections. Test performance is not FDA approved in patients less than 8 years old. Performed at [facility], [place] [address], [place], [place] [address]   Respiratory (~20 pathogens) panel by PCR     Status: None   Collection Time: 12/19/21  6:30 PM   Specimen: Nasopharyngeal Swab; Respiratory  Result Value Ref Range Status   Adenovirus NOT DETECTED NOT DETECTED Final   Coronavirus 229E NOT DETECTED NOT DETECTED Final    Comment: (NOTE) The Coronavirus on the Respiratory Panel, DOES NOT test  for the novel  Coronavirus (2019 nCoV)    Coronavirus HKU1 NOT DETECTED NOT DETECTED Final   Coronavirus NL63 NOT DETECTED NOT DETECTED Final   Coronavirus OC43 NOT DETECTED NOT DETECTED Final   Metapneumovirus NOT DETECTED NOT DETECTED Final   Rhinovirus / Enterovirus NOT DETECTED NOT DETECTED Final   Influenza A NOT DETECTED NOT DETECTED Final   Influenza B NOT DETECTED NOT DETECTED Final   Parainfluenza Virus 1 NOT DETECTED NOT DETECTED Final   Parainfluenza Virus 2 NOT DETECTED NOT DETECTED Final   Parainfluenza Virus 3 NOT DETECTED NOT DETECTED Final   Parainfluenza Virus 4 NOT DETECTED NOT DETECTED Final   Respiratory Syncytial Virus NOT DETECTED NOT DETECTED Final   Bordetella pertussis NOT DETECTED NOT DETECTED Final   Bordetella Parapertussis NOT DETECTED NOT DETECTED Final   Chlamydophila pneumoniae NOT DETECTED NOT DETECTED Final   Mycoplasma pneumoniae NOT DETECTED NOT DETECTED Final    Comment: Performed at [facility], [place]. [address], [place], [place] [address]  Respiratory (~20 pathogens) panel by PCR     Status: None   Collection Time: 12/20/21  4:26 PM   Specimen: Bronchoalveolar Lavage; Respiratory  Result Value Ref Range Status   Adenovirus NOT DETECTED NOT DETECTED Final   Coronavirus 229E NOT DETECTED NOT DETECTED Final    Comment: (NOTE) The Coronavirus on the Respiratory Panel, DOES NOT test for the novel  Coronavirus (2019 nCoV)    Coronavirus HKU1 NOT DETECTED NOT DETECTED Final   Coronavirus NL63 NOT DETECTED NOT DETECTED Final   Coronavirus OC43 NOT DETECTED NOT DETECTED Final   Metapneumovirus NOT DETECTED NOT DETECTED Final   Rhinovirus / Enterovirus NOT DETECTED NOT DETECTED Final   Influenza A NOT DETECTED NOT DETECTED Final   Influenza B NOT DETECTED NOT DETECTED Final   Parainfluenza Virus 1 NOT DETECTED NOT DETECTED Final   Parainfluenza Virus 2 NOT DETECTED NOT DETECTED Final   Parainfluenza Virus 3 NOT DETECTED NOT DETECTED Final    Parainfluenza Virus 4 NOT DETECTED NOT DETECTED Final   Respiratory Syncytial Virus NOT DETECTED NOT DETECTED Final   Bordetella pertussis NOT DETECTED NOT DETECTED Final   Bordetella Parapertussis NOT DETECTED NOT DETECTED Final   Chlamydophila pneumoniae NOT DETECTED NOT DETECTED Final   Mycoplasma pneumoniae NOT DETECTED NOT DETECTED Final    Comment: Performed at [facility], [place]. [address], [place], [place] [address]  Culture, Respiratory w Gram Stain     Status: None   Collection Time: 12/20/21  4:26 PM   Specimen: Bronchoalveolar  Lavage; Respiratory  Result Value Ref Range Status   Specimen Description   Final    BRONCHIAL ALVEOLAR LAVAGE Performed at [place] [address], [place], [place] [address]    Special Requests   Final    NONE Performed at [facility], [place] [address], [place], [place] [address]    Gram Stain NO WBC SEEN NO ORGANISMS SEEN   Final   Culture   Final    NO GROWTH Performed at [facility], [place] [address] [place], [place] [address]    Report Status 12/23/2021 FINAL  Final         Radiology Studies: No results found.      Scheduled Meds:  atorvastatin  20 mg Oral QPM   buPROPion  150 mg Oral q morning   Chlorhexidine Gluconate Cloth  6 each Topical Daily   cloNIDine  0.1 mg Oral QHS   enoxaparin (LOVENOX) injection  40 mg Subcutaneous Q24H   FLUoxetine  40 mg Oral Daily   hydrOXYzine  25 mg Oral Daily   insulin aspart  0-15 Units Subcutaneous TID WC   lurasidone  40 mg Oral QHS   methylPREDNISolone (SOLU-MEDROL) injection  80 mg Intravenous Daily   nicotine  21 mg Transdermal Daily   sodium chloride flush  3 mL Intravenous Q12H   Continuous Infusions:     LOS: 7 days    Time spent: 40 minutes    Irine Seal, MD [facility]   To contact the attending provider between 7A-7P or the covering provider during after hours 7P-7A, please log into the web site  [email or web address] and access using universal [facility] password for that web site. If you do not have the password, please call the hospital operator.  12/24/2021, 10:13 AM

## 2021-12-24 NOTE — TOC Progression Note (Signed)
Transition of Care [facility]) - Progression Note    Patient Details  Name: Erin [name] MRN: [id number] Date of Birth: [birth year]  [facility]) CM/SW Contact  Adrian Prows, RN Phone Number: 12/24/2021, 10:48 AM  Clinical Narrative:    Pt not ready for d/c; remains on O2; CCM consult; TOC will con't to follow.    Expected Discharge Plan: Home/Self Care Barriers to Discharge: Continued Medical Work up  Expected Discharge Plan and Services Expected Discharge Plan: Home/Self Care   Discharge Planning Services: CM Consult   Living arrangements for the past 2 months: Single Family Home                                       Social Determinants of Health (SDOH) Interventions    Readmission Risk Interventions     No data to display

## 2021-12-25 DIAGNOSIS — I10 Essential (primary) hypertension: Secondary | ICD-10-CM | POA: Diagnosis not present

## 2021-12-25 DIAGNOSIS — S27309D Unspecified injury of lung, unspecified, subsequent encounter: Secondary | ICD-10-CM | POA: Diagnosis not present

## 2021-12-25 DIAGNOSIS — J9601 Acute respiratory failure with hypoxia: Secondary | ICD-10-CM | POA: Diagnosis not present

## 2021-12-25 DIAGNOSIS — J189 Pneumonia, unspecified organism: Secondary | ICD-10-CM | POA: Diagnosis not present

## 2021-12-25 LAB — BASIC METABOLIC PANEL
Anion gap: 6 (ref 5–15)
BUN: 21 mg/dL (ref 8–23)
CO2: 27 mmol/L (ref 22–32)
Calcium: 8.6 mg/dL — ABNORMAL LOW (ref 8.9–10.3)
Chloride: 104 mmol/L (ref 98–111)
Creatinine, Ser: 0.58 mg/dL (ref 0.44–1.00)
GFR, Estimated: >60 mL/min (ref >60)
Glucose, Bld: 88 mg/dL (ref 70–99)
Potassium: 4 mmol/L (ref 3.5–5.1)
Sodium: 137 mmol/L (ref 135–145)

## 2021-12-25 LAB — CBC WITH DIFFERENTIAL/PLATELET
Abs Immature Granulocytes: 0.11 10*3/uL — ABNORMAL HIGH (ref 0.00–0.07)
Basophils Absolute: 0 10*3/uL (ref 0.0–0.1)
Basophils Relative: 0 %
Eosinophils Absolute: 0.3 10*3/uL (ref 0.0–0.5)
Eosinophils Relative: 3 %
HCT: 35.4 % — ABNORMAL LOW (ref 36.0–46.0)
Hemoglobin: 11.2 g/dL — ABNORMAL LOW (ref 12.0–15.0)
Immature Granulocytes: 1 %
Lymphocytes Relative: 28 %
Lymphs Abs: 3.7 10*3/uL (ref 0.7–4.0)
MCH: 28.8 pg (ref 26.0–34.0)
MCHC: 31.6 g/dL (ref 30.0–36.0)
MCV: 91 fL (ref 80.0–100.0)
Monocytes Absolute: 0.9 10*3/uL (ref 0.1–1.0)
Monocytes Relative: 7 %
Neutro Abs: 8.3 10*3/uL — ABNORMAL HIGH (ref 1.7–7.7)
Neutrophils Relative %: 61 %
Platelets: 416 10*3/uL — ABNORMAL HIGH (ref 150–400)
RBC: 3.89 MIL/uL (ref 3.87–5.11)
RDW: 14.2 % (ref 11.5–15.5)
WBC: 13.4 10*3/uL — ABNORMAL HIGH (ref 4.0–10.5)
nRBC: 0 % (ref 0.0–0.2)

## 2021-12-25 LAB — GLUCOSE, CAPILLARY
Glucose-Capillary: 81 mg/dL (ref 70–99)
Glucose-Capillary: 97 mg/dL (ref 70–99)
Glucose-Capillary: 230 mg/dL — ABNORMAL HIGH (ref 70–99)
Glucose-Capillary: 148 mg/dL — ABNORMAL HIGH (ref 70–99)

## 2021-12-25 LAB — MAGNESIUM: Magnesium: 2.4 mg/dL (ref 1.7–2.4)

## 2021-12-25 MED ORDER — INFLUENZA VAC A&B SA ADJ QUAD 0.5 ML IM PRSY
0.5000 mL | PREFILLED_SYRINGE | INTRAMUSCULAR | Status: AC
  Administered 2021-12-27: 0.5 mL via INTRAMUSCULAR
  Filled 2021-12-25 (×2): qty 0.5

## 2021-12-25 NOTE — Progress Notes (Signed)
2045- Ambu/walk metrics- Patient walked the hallway 1000 feet (entire lap around 4W unit)    12/25/21 2045 12/25/21 2046 12/25/21 2047  Vitals  Pulse Rate (!) 124 (!) 123 (!) 126  ECG Heart Rate  --   --   --   MEWS COLOR  MEWS Score Color Yellow Yellow Yellow  Oxygen Therapy  SpO2 (!) 87 % 91 % 90 %  O2 Device Room Air Nasal Cannula Nasal Cannula  O2 Flow Rate (L/min)  --  2 L/min 2 L/min  Patient Activity (if Appropriate) Ambulating Ambulating Ambulating  Pulse Oximetry Type  --   --   --     12/25/21 2048 12/25/21 2049  Vitals  Pulse Rate (!) 127  --   ECG Heart Rate  --  (!) 124  MEWS COLOR  MEWS Score Color Yellow Yellow  Oxygen Therapy  SpO2 92 % 93 %  O2 Device Nasal Cannula Nasal Cannula  O2 Flow Rate (L/min) 3 L/min 4 L/min  Patient Activity (if Appropriate) Ambulating Ambulating  Pulse Oximetry Type  --  Continuous

## 2021-12-25 NOTE — Progress Notes (Signed)
PROGRESS NOTE    Erin [name]  OQH:[id number] DOB: 1950/10/03 DOA: 12/17/2021 PCP: Aura Dials, PA-C    Chief Complaint  Patient presents with   Shortness of Breath    Brief Narrative:  71 y.o. female with medical history significant for depression, anxiety, and history of alcohol abuse in remission who presents emergency department for evaluation of cough and shortness of breath.   Patient initially sought care at an urgent care for 2 days of progressive cough and shortness of breath, was found to be hypoxic, and directed to the ED. In ED, pt was found to have xray findings worrisome for multifocal PNA with leukocytosis. Pt was started on abx. -CT angiogram chest done showed diffuse groundglass opacities raising concern for multiple differential diagnosis. -Due to worsening respiratory status and increased O2 requirements PCCM consulted. -Patient seen in consultation by PCCM and subsequently underwent bronchoscopy with BAL.  Patient due to concern for acute lung injury placed on IV Solu-Medrol with clinical improvement.   Assessment & Plan:   Principal Problem:   Sepsis due to pneumonia [facility]) Active Problems:   Acute respiratory failure with hypoxia (HCC)   Acute lung injury   Multifocal pneumonia   Depression   Hypokalemia   Essential hypertension   Prolonged QT interval   QT prolongation   Hypertension  #1 acute hypoxemic respiratory failure, POA secondary to ALI/ pneumonia -Patient noted to have presented with 2-day history of progressive cough, shortness of breath, noted to be hypoxemic. -CT angiogram negative for PE but did show diffuse groundglass opacities raising concern for a broad list of differential diagnosis. -Due to increased O2 requirements going up as high as 15 L high flow nasal cannula and required BiPAP PCCM consulted. 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 -Patient given a dose of IV Lasix (12/20/2021) with urine output not properly recorded -Patient started on high-dose IV Solu-Medrol 125 mg every 6 hours with clinical improvement and dose decreased to 80 mg daily (11/10) per pulmonary with O2 sats down to 2.5 L high flow nasal cannula today.  Did not require BiPAP overnight.  -Serology ordered per PCCM including vasculitis panel, QuantiFERON gold, urine Legionella antigen, urine strep pneumococcus antigen. -Doxycycline discontinued per PCCM and patient status post 5-day course of IV Rocephin.  -PCCM recommending BiPAP nightly and daytime 1-2 applications. -Per PCCM May DC daily BiPAP once down to < 6 L and continue BiPAP nightly and may DC nightly BiPAP once < 4 L O2 requirements. -PCCM recommending IV Solu-Medrol through the weekend, and then to start slow oral prednisone taper 12/26/2021 and taper over a few weeks to few months. -Will need outpatient follow-up with PCCM. -PCCM following and appreciate input and recommendations.  2.  Severe sepsis due to pneumonia -Patient on admission met criteria for severe sepsis with multifocal pneumonia concerns, fever, leukocytosis, tachypnea, acute hypoxic respiratory failure and placed empirically on IV Rocephin and azithromycin in the ED. -Status post full course of antibiotic treatment. -Due to increased O2 requirement CT angiogram chest on 12/19/2021 negative for PE however notable for extensive be crazy paving pattern with be groundglass opacities and interlobular septal thickening. -2D echo done EF 60 to 65%,NWMA. -See problem #1 above.  3.  Hypertension -Currently on home regimen clonidine.    4.  Depression/anxiety -Stable.   -Continue home regimen Prozac, Latuda, Wellbutrin, hydroxyzine.  Patient also placed on Ativan  as needed as husband recently passed a few days ago in the stepdown unit.   5.  Prolonged QT interval -QTc  noted at 507 on presentation in the ED. -Repeat EKG with RBBB with no significant change from prior EKG, some improvement with QTc. -Electrolytes repleted. -Potassium at 4 today. -Avoid QTc prolongation medications.  6.  Liver lesion -Incidental 5.3 cm right lobe lesion on CTA of chest noted. -Recommend nonemergent abdominal MRI once pulmonary issues have resolved which may be done in the outpatient setting.  DVT prophylaxis: Lovenox Code Status: Full Family Communication: Updated patient.  No family at bedside.  Disposition: Likely home once cleared by pulmonary hopefully in the next 1 to 2 days.     Status is: Inpatient Remains inpatient appropriate because: Severity of illness   Consultants:  PCCM: Dr. Chase Caller 12/19/2021  Procedures:  Flexible bronchoscopy, BAL of the lingula per PCCM, Dr. Chase Caller 12/20/2021 CT angiogram chest 12/19/2021 Chest x-ray 12/17/2021, 12/20/2021, 12/21/2021 2D echo 12/20/2021  Significant Hospital Events:  12/17/2021 - admit 12/19/21 - ccm consult             - LDH 450             - CRP 25/ ESR 111             - BNP 107             - EOS 1200 (amdit 600)             - Quant GOld >> negative             - RVP 12/19/21 - NEG             - VASCULITIS PANEL >>             - SEROLOGY panel >>             - Urine strep/leg >>             - covid IgG - POSITIVE (2 year grand kid had covid 2 mo ago)   12/20/21 Bronch BAL              -11% eos, 86% PMN NO ALV hge             - repeat RVP neg             - stARted solumedrol 140m Q6h               - 13L HFNC. Using BiPAP QHs and day time 1-2 times  Antimicrobials:  Oral Azithromycin 12/17/2021 x 1 dose IV Rocephin 12/17/2021>>>> 12/22/2021 Doxycycline 12/18/2021>>>> 12/21/2021   Subjective: Sitting up in bed.  Feels shortness of breath is improving daily.  No chest pain.  No abdominal pain.  Has not required BiPAP for the past 2 nights.  Overall feels she is progressing and getting better,  daily.  Objective: Vitals:   12/24/21 1948 12/24/21 1959 12/24/21 2347 12/25/21 0420  BP: (!) 128/54  128/73 122/64  Pulse: 72  61 63  Resp: _0 Temp: 97.8 F (36.6 C)  98.4 F (36.9 C) 98.2 F (36.8 C)  TempSrc: Oral  Oral Oral  SpO2: 93% 92% 98% 95%  Weight:        Intake/Output Summary (Last 24 hours) at 12/25/2021 1048 Last data filed at 12/25/2021 0700 Gross per 24 hour  Intake --  Output 550 ml  Net -550 ml    FAutoliv  12/17/21 1657  Weight: 91.6 kg    Examination:  General exam: NAD Respiratory system: Improving coarse breath sounds in the bases. Currently on 3 L nasal cannula with sats of approximately 100%.   Cardiovascular system: Regular rate rhythm no murmurs rubs or gallops.  No JVD.  No lower extremity edema.   Gastrointestinal system: Abdomen is soft, nontender, nondistended, positive bowel sounds.  No rebound.  No guarding.  Central nervous system: Alert and oriented. No focal neurological deficits. Extremities: Symmetric 5 x 5 power. Skin: No rashes, lesions or ulcers Psychiatry: Judgement and insight appear normal. Mood & affect appropriate.     Data Reviewed: I have personally reviewed following labs and imaging studies  CBC: Recent Labs  Lab 12/19/21 1855 12/20/21 0333 12/21/21 0327 12/22/21 0313 12/23/21 0300 12/24/21 0335 12/25/21 0352  WBC 13.6*   < > 8.7 13.9* 13.0* 10.5 13.4*  NEUTROABS 10.3*  --   --  12.2*  --  6.7 8.3*  HGB 11.1*   < > 10.9* 10.7* 10.5* 10.8* 11.2*  HCT 34.0*   < > 33.5* 32.2* 32.7* 34.5* 35.4*  MCV 88.5   < > 88.4 86.8 89.3 91.8 91.0  PLT 429*   < > 442* 424* 433* 310 416*   < > = values in this interval not displayed.     Basic Metabolic Panel: Recent Labs  Lab 12/20/21 0333 12/21/21 0327 12/22/21 0313 12/23/21 0300 12/24/21 0335 12/25/21 0352  NA 133* 135 137 138 136 137  K 3.9 4.4 4.0 4.2 5.0 4.0  CL 97* 98 101 103 106 104  CO2 _0 GLUCOSE 113* 157* 144* 147*  102* 88  BUN 13 19 29* 29* 22 21  CREATININE 0.67 0.64 0.65 0.56 0.60 0.58  CALCIUM 8.9 9.4 9.3 9.3 8.6* 8.6*  MG 1.9  --  2.1  --  2.5* 2.4  PHOS 4.1  --   --   --   --   --      GFR: CrCl cannot be calculated (Unknown ideal weight.).  Liver Function Tests: Recent Labs  Lab 12/21/21 0327  AST 35  ALT 33  ALKPHOS 201*  BILITOT 0.6  PROT 6.5  ALBUMIN 2.4*     CBG: Recent Labs  Lab 12/24/21 0806 12/24/21 1157 12/24/21 1517 12/24/21 2253 12/25/21 0733  GLUCAP 85 148* 181* 114* 81      Recent Results (from the past 240 hour(s))  Resp Panel by RT-PCR (Flu A&B, Covid) Anterior Nasal Swab     Status: None   Collection Time: 12/17/21  6:24 PM   Specimen: Anterior Nasal Swab  Result Value Ref Range Status   SARS Coronavirus 2 by RT PCR NEGATIVE NEGATIVE Final    Comment: (NOTE) SARS-CoV-2 target nucleic acids are NOT DETECTED.  The SARS-CoV-2 RNA is generally detectable in upper respiratory specimens during the acute phase of infection. The lowest concentration of SARS-CoV-2 viral copies this assay can detect is 138 copies/mL. A negative result does not preclude SARS-Cov-2 infection and should not be used as the sole basis for treatment or other patient management decisions. A negative result may occur with  improper specimen collection/handling, submission of specimen other than nasopharyngeal swab, presence of viral mutation(s) within the areas targeted by this assay, and inadequate number of viral copies(<138 copies/mL). A negative result must be combined with clinical observations, patient history, and epidemiological information. The expected result is Negative.  Fact Sheet for Patients:  EntrepreneurPulse.com.au  Fact  Sheet for Healthcare Providers:  IncredibleEmployment.be  This test is no t yet approved or cleared by the [place] FDA and  has been authorized for detection and/or diagnosis of SARS-CoV-2 by FDA  under an Emergency Use Authorization (EUA). This EUA will remain  in effect (meaning this test can be used) for the duration of the COVID-19 declaration under Section 564(b)(1) of the Act, 21 U.S.C.section 360bbb-3(b)(1), unless the authorization is terminated  or revoked sooner.       Influenza A by PCR NEGATIVE NEGATIVE Final   Influenza B by PCR NEGATIVE NEGATIVE Final    Comment: (NOTE) The Xpert Xpress SARS-CoV-2/FLU/RSV plus assay is intended as an aid in the diagnosis of influenza from Nasopharyngeal swab specimens and should not be used as a sole basis for treatment. Nasal washings and aspirates are unacceptable for Xpert Xpress SARS-CoV-2/FLU/RSV testing.  Fact Sheet for Patients: EntrepreneurPulse.com.au  Fact Sheet for Healthcare Providers: IncredibleEmployment.be  This test is not yet approved or cleared by the [place] FDA and has been authorized for detection and/or diagnosis of SARS-CoV-2 by FDA under an Emergency Use Authorization (EUA). This EUA will remain in effect (meaning this test can be used) for the duration of the COVID-19 declaration under Section 564(b)(1) of the Act, 21 U.S.C. section 360bbb-3(b)(1), unless the authorization is terminated or revoked.  Performed at [facility], [place] [address] [place], [place] [address]   Culture, blood (x 2)     Status: None   Collection Time: 12/18/21 12:40 AM   Specimen: BLOOD  Result Value Ref Range Status   Specimen Description   Final    BLOOD BLOOD RIGHT FOREARM Performed at [place] [address] [place], [place] [address]    Special Requests   Final    BOTTLES DRAWN AEROBIC AND ANAEROBIC Blood Culture adequate volume Performed at [place] [address] [place], [place] [address]    Culture   Final    NO GROWTH 5 DAYS Performed at [facility], [place] [address] [place], [place]  [address]    Report Status 12/23/2021 FINAL  Final  Culture, blood (x 2)     Status: None   Collection Time: 12/18/21 12:40 AM   Specimen: BLOOD  Result Value Ref Range Status   Specimen Description   Final    BLOOD BLOOD RIGHT HAND Performed at [place] [address], [place], [place] [address]    Special Requests   Final    BOTTLES DRAWN AEROBIC AND ANAEROBIC Blood Culture adequate volume Performed at [place] [address], [place], [place] [address]    Culture   Final    NO GROWTH 5 DAYS Performed at [facility], [place] [address], [place],  [address]    Report Status 12/23/2021 FINAL  Final  MRSA Next Gen by PCR, Nasal     Status: None   Collection Time: 12/18/21  2:20 PM   Specimen: Nasal Mucosa; Nasal Swab  Result Value Ref Range Status   MRSA by PCR Next Gen NOT DETECTED NOT DETECTED Final    Comment: (NOTE) The GeneXpert MRSA Assay (FDA approved for NASAL specimens only), is one component of a comprehensive MRSA colonization surveillance program. It is not intended to diagnose MRSA infection nor to guide or monitor treatment for MRSA infections. Test performance is not FDA approved in patients less than 84 years old. Performed at [facility],  [place] [address], [place], [place] [address]   Respiratory (~20 pathogens) panel by PCR     Status: None   Collection Time: 12/19/21  6:30 PM   Specimen: Nasopharyngeal Swab; Respiratory  Result Value Ref Range Status   Adenovirus NOT DETECTED NOT DETECTED Final   Coronavirus 229E NOT DETECTED NOT DETECTED Final    Comment: (NOTE) The Coronavirus on the Respiratory Panel, DOES NOT test for the novel  Coronavirus (2019 nCoV)    Coronavirus HKU1 NOT DETECTED NOT DETECTED Final   Coronavirus NL63 NOT DETECTED NOT DETECTED Final   Coronavirus OC43 NOT DETECTED NOT DETECTED Final   Metapneumovirus NOT DETECTED NOT DETECTED Final   Rhinovirus /  Enterovirus NOT DETECTED NOT DETECTED Final   Influenza A NOT DETECTED NOT DETECTED Final   Influenza B NOT DETECTED NOT DETECTED Final   Parainfluenza Virus 1 NOT DETECTED NOT DETECTED Final   Parainfluenza Virus 2 NOT DETECTED NOT DETECTED Final   Parainfluenza Virus 3 NOT DETECTED NOT DETECTED Final   Parainfluenza Virus 4 NOT DETECTED NOT DETECTED Final   Respiratory Syncytial Virus NOT DETECTED NOT DETECTED Final   Bordetella pertussis NOT DETECTED NOT DETECTED Final   Bordetella Parapertussis NOT DETECTED NOT DETECTED Final   Chlamydophila pneumoniae NOT DETECTED NOT DETECTED Final   Mycoplasma pneumoniae NOT DETECTED NOT DETECTED Final    Comment: Performed at [facility], [place]. [address] [place], [place] [address]  Respiratory (~20 pathogens) panel by PCR     Status: None   Collection Time: 12/20/21  4:26 PM   Specimen: Bronchoalveolar Lavage; Respiratory  Result Value Ref Range Status   Adenovirus NOT DETECTED NOT DETECTED Final   Coronavirus 229E NOT DETECTED NOT DETECTED Final    Comment: (NOTE) The Coronavirus on the Respiratory Panel, DOES NOT test for the novel  Coronavirus (2019 nCoV)    Coronavirus HKU1 NOT DETECTED NOT DETECTED Final   Coronavirus NL63 NOT DETECTED NOT DETECTED Final   Coronavirus OC43 NOT DETECTED NOT DETECTED Final   Metapneumovirus NOT DETECTED NOT DETECTED Final   Rhinovirus / Enterovirus NOT DETECTED NOT DETECTED Final   Influenza A NOT DETECTED NOT DETECTED Final   Influenza B NOT DETECTED NOT DETECTED Final   Parainfluenza Virus 1 NOT DETECTED NOT DETECTED Final   Parainfluenza Virus 2 NOT DETECTED NOT DETECTED Final   Parainfluenza Virus 3 NOT DETECTED NOT DETECTED Final   Parainfluenza Virus 4 NOT DETECTED NOT DETECTED Final   Respiratory Syncytial Virus NOT DETECTED NOT DETECTED Final   Bordetella pertussis NOT DETECTED NOT DETECTED Final   Bordetella Parapertussis NOT DETECTED NOT DETECTED Final   Chlamydophila pneumoniae NOT  DETECTED NOT DETECTED Final   Mycoplasma pneumoniae NOT DETECTED NOT DETECTED Final    Comment: Performed at [facility], [place]. [address], [place], [place] [address]  Culture, Respiratory w Gram Stain     Status: None   Collection Time: 12/20/21  4:26 PM   Specimen: Bronchoalveolar Lavage; Respiratory  Result Value Ref Range Status   Specimen Description   Final    BRONCHIAL ALVEOLAR LAVAGE Performed at [place] [address] [place], [place] [address]    Special Requests   Final    NONE Performed at [facility], [place] [address], [place], [place] [address]    Gram Stain NO WBC SEEN NO ORGANISMS SEEN   Final   Culture   Final    NO GROWTH Performed at [facility],  [address] [place], [place] [address]    Report Status 12/23/2021 FINAL  Final         Radiology Studies: No results found.      Scheduled Meds:  atorvastatin  20 mg Oral QPM   buPROPion  150 mg Oral q morning   Chlorhexidine Gluconate Cloth  6 each Topical Daily   cloNIDine  0.1 mg Oral QHS   enoxaparin (LOVENOX) injection  40 mg Subcutaneous Q24H   FLUoxetine  40 mg Oral Daily   hydrOXYzine  25 mg Oral Daily   insulin aspart  0-15 Units Subcutaneous TID WC   lurasidone  40 mg Oral QHS   methylPREDNISolone (SOLU-MEDROL) injection  80 mg Intravenous Daily   nicotine  21 mg Transdermal Daily   senna-docusate  1 tablet Oral BID   sodium chloride flush  3 mL Intravenous Q12H   Continuous Infusions:     LOS: 8 days    Time spent: 35 minutes    Irine Seal, MD [facility]   To contact the attending provider between 7A-7P or the covering provider during after hours 7P-7A, please log into the web site [email or web address] and access using universal [place] password for that web site. If you do not have the password, please call the hospital operator.  12/25/2021, 10:48 AM

## 2021-12-26 ENCOUNTER — Telehealth: Payer: Self-pay | Admitting: Pulmonary Disease

## 2021-12-26 DIAGNOSIS — S27309D Unspecified injury of lung, unspecified, subsequent encounter: Secondary | ICD-10-CM | POA: Diagnosis not present

## 2021-12-26 DIAGNOSIS — Z7952 Long term (current) use of systemic steroids: Secondary | ICD-10-CM

## 2021-12-26 DIAGNOSIS — D84821 Immunodeficiency due to drugs: Secondary | ICD-10-CM | POA: Diagnosis not present

## 2021-12-26 DIAGNOSIS — I10 Essential (primary) hypertension: Secondary | ICD-10-CM | POA: Diagnosis not present

## 2021-12-26 DIAGNOSIS — Z720 Tobacco use: Secondary | ICD-10-CM | POA: Diagnosis not present

## 2021-12-26 DIAGNOSIS — T380X5A Adverse effect of glucocorticoids and synthetic analogues, initial encounter: Secondary | ICD-10-CM

## 2021-12-26 DIAGNOSIS — J189 Pneumonia, unspecified organism: Secondary | ICD-10-CM

## 2021-12-26 DIAGNOSIS — J9601 Acute respiratory failure with hypoxia: Secondary | ICD-10-CM | POA: Diagnosis not present

## 2021-12-26 LAB — GLUCOSE, CAPILLARY
Glucose-Capillary: 71 mg/dL (ref 70–99)
Glucose-Capillary: 101 mg/dL — ABNORMAL HIGH (ref 70–99)
Glucose-Capillary: 183 mg/dL — ABNORMAL HIGH (ref 70–99)
Glucose-Capillary: 168 mg/dL — ABNORMAL HIGH (ref 70–99)

## 2021-12-26 LAB — CBC WITH DIFFERENTIAL/PLATELET
Abs Immature Granulocytes: 0.13 10*3/uL — ABNORMAL HIGH (ref 0.00–0.07)
Basophils Absolute: 0 10*3/uL (ref 0.0–0.1)
Basophils Relative: 0 %
Eosinophils Absolute: 0.3 10*3/uL (ref 0.0–0.5)
Eosinophils Relative: 3 %
HCT: 36.2 % (ref 36.0–46.0)
Hemoglobin: 11.4 g/dL — ABNORMAL LOW (ref 12.0–15.0)
Immature Granulocytes: 1 %
Lymphocytes Relative: 23 %
Lymphs Abs: 3 10*3/uL (ref 0.7–4.0)
MCH: 28.6 pg (ref 26.0–34.0)
MCHC: 31.5 g/dL (ref 30.0–36.0)
MCV: 91 fL (ref 80.0–100.0)
Monocytes Absolute: 0.8 10*3/uL (ref 0.1–1.0)
Monocytes Relative: 6 %
Neutro Abs: 8.8 10*3/uL — ABNORMAL HIGH (ref 1.7–7.7)
Neutrophils Relative %: 67 %
Platelets: 413 10*3/uL — ABNORMAL HIGH (ref 150–400)
RBC: 3.98 MIL/uL (ref 3.87–5.11)
RDW: 14.1 % (ref 11.5–15.5)
WBC: 13 10*3/uL — ABNORMAL HIGH (ref 4.0–10.5)
nRBC: 0 % (ref 0.0–0.2)

## 2021-12-26 LAB — BASIC METABOLIC PANEL
Anion gap: 6 (ref 5–15)
BUN: 15 mg/dL (ref 8–23)
CO2: 27 mmol/L (ref 22–32)
Calcium: 8.5 mg/dL — ABNORMAL LOW (ref 8.9–10.3)
Chloride: 101 mmol/L (ref 98–111)
Creatinine, Ser: 0.51 mg/dL (ref 0.44–1.00)
GFR, Estimated: >60 mL/min (ref >60)
Glucose, Bld: 119 mg/dL — ABNORMAL HIGH (ref 70–99)
Potassium: 3.6 mmol/L (ref 3.5–5.1)
Sodium: 134 mmol/L — ABNORMAL LOW (ref 135–145)

## 2021-12-26 LAB — URINE DRUGS OF ABUSE SCREEN W ALC, ROUTINE (REF LAB)
Amphetamines, Urine: NEGATIVE ng/mL
Barbiturate, Ur: NEGATIVE ng/mL
Benzodiazepine Quant, Ur: NEGATIVE ng/mL
Cocaine (Metab.): NEGATIVE ng/mL
Ethanol U, Quan: NEGATIVE %
Methadone Screen, Urine: NEGATIVE ng/mL
Opiate Quant, Ur: NEGATIVE ng/mL
Phencyclidine, Ur: NEGATIVE ng/mL
Propoxyphene, Urine: NEGATIVE ng/mL

## 2021-12-26 LAB — LEGIONELLA PNEUMOPHILA SEROGP 1 UR AG: L. pneumophila Serogp 1 Ur Ag: NEGATIVE

## 2021-12-26 LAB — PANEL 799049
CARBOXY THC GC/MS CONF: 70 ng/mL
Cannabinoid GC/MS, Ur: POSITIVE — AB

## 2021-12-26 MED ORDER — PREDNISONE 50 MG PO TABS
60.0000 mg | ORAL_TABLET | Freq: Every day | ORAL | Status: DC
  Administered 2021-12-27: 60 mg via ORAL
  Filled 2021-12-26: qty 1

## 2021-12-26 MED ORDER — DAPSONE 100 MG PO TABS
100.0000 mg | ORAL_TABLET | Freq: Every day | ORAL | Status: DC
  Administered 2021-12-27: 100 mg via ORAL
  Filled 2021-12-26 (×2): qty 1

## 2021-12-26 NOTE — Plan of Care (Signed)
Patient sitting up in chair working on phone, oxygen intact, denies pain, up adlib to bathroom.

## 2021-12-26 NOTE — Telephone Encounter (Signed)
Patient admitted to [facility]. Anticipated discharge in am 11/14.  Please arrange for hospital follow up for 2-4 weeks with Dr. Marchelle Gearing.      Canary Brim, MSN, APRN, NP-C, AGACNP-BC [facility] 12/26/2021, 3:02 PM   Please see [email or web address] for pager details.   From 7A-7P if no response, please call [phone] After hours, please call ELink [phone]

## 2021-12-26 NOTE — Progress Notes (Addendum)
NAME:  Erin [name], MRN:  [id number], DOB:  [birth year], LOS: 9 ADMISSION DATE:  12/17/2021, CONSULTATION DATE:  12/19/21 REFERRING MD:  Dr Wyline Copas of [facility] CHIEF COMPLAINT:  Acute hypoxemic resp failure,    BRIEF  71 year old "big time" smoker who is retired.  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 Suffers from depression, anxiety, previous history of alcohol abuse in remission currently on December 12, 2021 Monday her 103-year-old granddaughter got diagnosed with her first ever RSV infection (also had covid a month ago from day care).  She did have substantial exposure to this granddaughter.  Then on 12/15/2021 Thursday patient became ill abruptly and presented to the ER on 12/17/2021 with 2-day history of progressive cough and shortness of breath.  Found to be hypoxemic.  CT angiogram today ruled out pulmonary embolism but showed diffuse groundglass opacities raising concern for a broad list of differential diagnosis.  She is on antibacterial community-acquired pneumonia antibiotics with Rocephin and azithromycin but without improvement.  Requiring 15 L high flow nasal cannula and subjectively feeling okay and watching TV but is actually tachypneic.  She was febrile 101 Fahrenheit with a high white count consistent with SIRS physiology.  Concern for bacterial pneumonia/sepsis.  Given the presence of infiltrates and hypoxemia  Her blood eosinophils were high at 900 cells per cubic millimeter at admission.  Previously normal.  No prior history of asthma.  No mold exposure in the house.  No feather pillow or feather blankets in the house.  No pet birds in the house. Noted per daughte 12/20/21" old home . There could be mold. Dog in home urinates all over  PCCM called for consultation.  Past Medical History:   has a past medical history of Alcohol abuse (11/08/2016), Alcoholism ([place]), Depression, and Hyperlipemia.   reports that she has been smoking cigarettes. She has been smoking an average of 1 pack  per day. She has never used smokeless tobacco. Significant Hospital Events:  12/17/2021 - admit 12/19/21 - ccm consult  - LDH 450  - CRP 25/ ESR 111  - BNP 107  - EOS 1200 (amdit 600)  - Quant GOld >> neg  - RVP 12/19/21 - NEG  - VASCULITIS PANEL >>  - SEROLOGY panel >>  - Urine strep/leg >>strep neg >>   - covid IgG - POSITIVE (2 year grand kid had covid 2 mo ago 12/20/21 Bronch BAL   -11% eos, 86% PMN NO ALV hge  - repeat RVP neg  - stARted solumedrol 172m Q6h  - 13L HFNC. Using BiPAP QHs and day time 1-2 times 12/21/2021 - Used bipap all night. This am down to 8L [place] and hungry. Afebrile. Feeling better  Interim History / Subjective:  O2 weaned to 2L  Pt reports feeling good, thinks she is ready to go home. Reports her husband died while she was in the hospital.  Has a daughter for support locally.  Afebrile  Objective   Blood pressure (!) 104/54, pulse (!) 59, temperature 98.1 F (36.7 C), temperature source Oral, resp. rate 16, height _0  (1.727 m), weight 90.7 kg, SpO2 94 %.       No intake or output data in the 24 hours ending 12/26/21 1200  Filed Weights   12/17/21 1657 12/25/21 1601  Weight: 91.6 kg 90.7 kg   Examination: General: adult female sitting up in bed in NAD HEENT: MM pink/moist, [place] O2, anicteric Neuro: AAOx4, speech clear, MAE  CV: s1s2 RRR, no m/r/g PULM: non-labored at rest,  lungs bilaterally clear GI: soft, bsx4 active  Extremities: warm/dry, no edema  Skin: no rashes or lesions   Resolved Hospital Problem list     Assessment & Plan:   Acute Hypoxemic Respiratory Failure with Pulmonary Infiltrates, suspected COP Recent RSV Exposure  Tobacco Abuse  CT Chest with extensive bilateral crazy paving pattern characterized by extensive bilateral ground glass opacities and interlobular septal thickening  BAL 11/7 Features c/w ALI - AIP with >80% PMN with AEP (11% eos) 2nd in ddx. RVP x 2 neg including from BAL little improvement with abx /  diureses (?viral process, although not isolated on studies) Started on solumedrol 175m q 6 11/7 with continued rapid improvement  Normal ECHO 11/7 Quantiferon TB negative  Completed ceftriaxone/ doxy after 5 day course,   -wean O2 for sats >90% -assess ambulatory O2 needs prior to discharge  -follow up legionella, autoimmune labs -monitor off abx -nicotine patch, smoking cessation encouraged / patient counseled  -transition solumedrol to prednisone 60 mg QD with slow wean by 10 mg Q week until seen by pulmonary  -will need PJP prophylaxis, discussed QTc / bactrim risk, will use dapsone given QTc  -assess EKG now for QTc -pulmonary hygiene -IS, mobilize -consider repeat CT imaging  -anticipate she can discharge 11/14 after ambulated for O2 needs, no acute changes in clinical status with steroid change -follow up as outpatient with Pulmonary, office contacted for appt      Best Practice:   Per [facility]       BNoe Gens MSN, APRN, NP-C, AGACNP-BC [facility] 12/26/2021, 12:00 PM   Please see [email or web address] for pager details.   From 7A-7P if no response, please call [phone] After hours, please call ELink [phone]

## 2021-12-26 NOTE — Progress Notes (Signed)
Mobility Specialist - Progress Note  Pre-mobility: 93% SpO2 During mobility: 87% SpO2 Post-mobility: 92% SPO2  12/26/21 1400  Oxygen Therapy  O2 Device Nasal Cannula  O2 Flow Rate (L/min) 2 L/min  Mobility  Activity Ambulated independently in hallway  Level of Assistance Modified independent, requires aide device or extra time  Assistive Device None  Distance Ambulated (ft) 350 ft  Range of Motion/Exercises Active  Activity Response Tolerated well  Mobility Referral Yes  $Mobility charge 1 Mobility   Pt was found in bed and agreeable to ambulate. Had no complaints during ambulation and towards the EOS stated that she felt as though she was trying to catch her breath. Upon returning to room she sat on the recliner chair and it took ~ 2 min to bring her SPO2 from 87% to 92%. She was left with all necessities in reach.  Billey Chang Mobility Specialist

## 2021-12-26 NOTE — Progress Notes (Signed)
PROGRESS NOTE    Erin [name]  MVE:[id number] DOB: 1951-02-05 DOA: 12/17/2021 PCP: Aura Dials, PA-C    Chief Complaint  Patient presents with   Shortness of Breath    Brief Narrative:  71 y.o. female with medical history significant for depression, anxiety, and history of alcohol abuse in remission who presents emergency department for evaluation of cough and shortness of breath.   Patient initially sought care at an urgent care for 2 days of progressive cough and shortness of breath, was found to be hypoxic, and directed to the ED. In ED, pt was found to have xray findings worrisome for multifocal PNA with leukocytosis. Pt was started on abx. -CT angiogram chest done showed diffuse groundglass opacities raising concern for multiple differential diagnosis. -Due to worsening respiratory status and increased O2 requirements PCCM consulted. -Patient seen in consultation by PCCM and subsequently underwent bronchoscopy with BAL.  Patient due to concern for acute lung injury placed on IV Solu-Medrol with clinical improvement.   Assessment & Plan:   Principal Problem:   Sepsis due to pneumonia [facility]) Active Problems:   Acute respiratory failure with hypoxia (HCC)   Acute lung injury   Multifocal pneumonia   Depression   Hypokalemia   Essential hypertension   Prolonged QT interval   QT prolongation   Hypertension  #1 acute hypoxemic respiratory failure, POA secondary to ALI/ pneumonia -Patient noted to have presented with 2-day history of progressive cough, shortness of breath, noted to be hypoxemic. -CT angiogram negative for PE but did show diffuse groundglass opacities raising concern for a broad list of differential diagnosis. -Due to increased O2 requirements going up as high as 15 L high flow nasal cannula and required BiPAP PCCM consulted. 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 -Patient given a dose of IV Lasix (12/20/2021) with urine output not properly recorded -Patient started on high-dose IV Solu-Medrol 125 mg every 6 hours with clinical improvement and dose decreased to 80 mg daily (11/10) per pulmonary with O2 sats down to 2 L high flow nasal cannula today.  Did not require BiPAP over the past 3 nights.  -Serology ordered per PCCM including vasculitis panel, QuantiFERON gold, urine Legionella antigen, urine strep pneumococcus antigen. -Doxycycline discontinued per PCCM and patient status post 5-day course of IV Rocephin.  -PCCM recommending BiPAP nightly and daytime 1-2 applications. -Per PCCM May DC daily BiPAP once down to < 6 L and continue BiPAP nightly and may DC nightly BiPAP once < 4 L O2 requirements. -PCCM recommending IV Solu-Medrol through the weekend, and then to start slow oral prednisone taper 12/26/2021 and taper over a few weeks to few months. -Discontinue BiPAP. -Will need outpatient follow-up with PCCM. -PCCM following and appreciate input and recommendations.  2.  Severe sepsis due to pneumonia -Patient on admission met criteria for severe sepsis with multifocal pneumonia concerns, fever, leukocytosis, tachypnea, acute hypoxic respiratory failure and placed empirically on IV Rocephin and azithromycin in the ED. -Status post full course of antibiotic treatment. -Due to increased O2 requirement CT angiogram chest on 12/19/2021 negative for PE however notable for extensive be crazy paving pattern with be groundglass opacities and interlobular septal thickening. -2D echo done EF 60 to 65%,NWMA. -See problem #1 above.  3.  Hypertension -Continue home regimen clonidine.    4.  Depression/anxiety -Stable.   -Continue home regimen Prozac, Latuda, Wellbutrin, hydroxyzine.  Patient also placed on Ativan as needed as husband recently passed a few days ago in the stepdown unit.   5.   Prolonged QT interval -QTc noted at 507 on presentation in the ED. -Repeat EKG with RBBB with no significant change from prior EKG, some improvement with QTc. -Electrolytes repleted. -Potassium at 3.6.  -Avoid QTc prolongation medications.  6.  Liver lesion -Incidental 5.3 cm right lobe lesion on CTA of chest noted. -Recommend nonemergent abdominal MRI once pulmonary issues have resolved which may be done in the outpatient setting.  DVT prophylaxis: Lovenox Code Status: Full Family Communication: Updated patient.  No family at bedside.  Disposition: Likely home once cleared by pulmonary hopefully in the next 1 to 2 days.     Status is: Inpatient Remains inpatient appropriate because: Severity of illness   Consultants:  PCCM: Dr. Chase Caller 12/19/2021  Procedures:  Flexible bronchoscopy, BAL of the lingula per PCCM, Dr. Chase Caller 12/20/2021 CT angiogram chest 12/19/2021 Chest x-ray 12/17/2021, 12/20/2021, 12/21/2021 2D echo 12/20/2021  Significant Hospital Events:  12/17/2021 - admit 12/19/21 - ccm consult             - LDH 450             - CRP 25/ ESR 111             - BNP 107             - EOS 1200 (amdit 600)             - Quant GOld >> negative             - RVP 12/19/21 - NEG             - VASCULITIS PANEL >>             - SEROLOGY panel >>             - Urine strep/leg >>             - covid IgG - POSITIVE (2 year grand kid had covid 2 mo ago)   12/20/21 Bronch BAL              -11% eos, 86% PMN NO ALV hge             - repeat RVP neg             - stARted solumedrol 151m Q6h               - 13L HFNC. Using BiPAP QHs and day time 1-2 times  Antimicrobials:  Oral Azithromycin 12/17/2021 x 1 dose IV Rocephin 12/17/2021>>>> 12/22/2021 Doxycycline 12/18/2021>>>> 12/21/2021   Subjective: Patient sitting up in bed.  States she feels much better.  Stated she ambulated in the hallway yesterday with nasal cannula and felt well.  Did not feel any palpitations or chest pain.  Did  not feel significantly short of breath.  Has not required BiPAP in the past 3 nights.    Objective: Vitals:   12/25/21 2048 12/25/21 2049 12/25/21 2123 12/26/21 0540  BP:   116/70 (!) 104/54  Pulse: (!) 127  81 (!) 59  Resp:   16 16  Temp:   98.2 F (36.8 C) 98.1 F (36.7 C)  TempSrc:   Oral Oral  SpO2: 92% 93% 96% 94%  Weight:      Height:       No intake or output data in the 24 hours ending 12/26/21 0951  Filed Weights   12/17/21 1657 12/25/21 1601  Weight: 91.6 kg 90.7 kg    Examination:  General exam: NAD Respiratory system: Lungs clear to auscultation bilaterally.  Some decreased breath sounds in the bases.  No use of accessory muscles of respiration.  On 2 L nasal cannula with sats of 94 to 96%.    Cardiovascular system: RRR no murmurs rubs or gallops.  No JVD.  No lower extremity edema.  Gastrointestinal system: Abdomen is soft, nontender, nondistended, positive bowel sounds.  No rebound.  No guarding.  Central nervous system: Alert and oriented. No focal neurological deficits. Extremities: Symmetric 5 x 5 power. Skin: No rashes, lesions or ulcers Psychiatry: Judgement and insight appear normal. Mood & affect appropriate.     Data Reviewed: I have personally reviewed following labs and imaging studies  CBC: Recent Labs  Lab 12/19/21 1855 12/20/21 0333 12/22/21 0313 12/23/21 0300 12/24/21 0335 12/25/21 0352 12/26/21 0351  WBC 13.6*   < > 13.9* 13.0* 10.5 13.4* 13.0*  NEUTROABS 10.3*  --  12.2*  --  6.7 8.3* 8.8*  HGB 11.1*   < > 10.7* 10.5* 10.8* 11.2* 11.4*  HCT 34.0*   < > 32.2* 32.7* 34.5* 35.4* 36.2  MCV 88.5   < > 86.8 89.3 91.8 91.0 91.0  PLT 429*   < > 424* 433* 310 416* 413*   < > = values in this interval not displayed.     Basic Metabolic Panel: Recent Labs  Lab 12/20/21 0333 12/21/21 0327 12/22/21 0313 12/23/21 0300 12/24/21 0335 12/25/21 0352 12/26/21 0351  NA 133*   < > 137 138 136 137 134*  K 3.9   < > 4.0 4.2 5.0 4.0 3.6  CL  97*   < > 101 103 106 104 101  CO2 25   < > _0 GLUCOSE 113*   < > 144* 147* 102* 88 119*  BUN 13   < > 29* 29* _1 CREATININE 0.67   < > 0.65 0.56 0.60 0.58 0.51  CALCIUM 8.9   < > 9.3 9.3 8.6* 8.6* 8.5*  MG 1.9  --  2.1  --  2.5* 2.4  --   PHOS 4.1  --   --   --   --   --   --    < > = values in this interval not displayed.     GFR: Estimated Creatinine Clearance: 78.2 mL/min (by C-G formula based on SCr of 0.51 mg/dL).  Liver Function Tests: Recent Labs  Lab 12/21/21 0327  AST 35  ALT 33  ALKPHOS 201*  BILITOT 0.6  PROT 6.5  ALBUMIN 2.4*     CBG: Recent Labs  Lab 12/25/21 0733 12/25/21 1221 12/25/21 1718 12/25/21 2126 12/26/21 0724  GLUCAP 81 97 230* 148* 71      Recent Results (from the past 240 hour(s))  Resp Panel by RT-PCR (Flu A&B, Covid) Anterior Nasal Swab     Status: None   Collection Time: 12/17/21  6:24 PM   Specimen: Anterior Nasal Swab  Result Value Ref Range Status   SARS Coronavirus 2 by RT PCR NEGATIVE NEGATIVE Final    Comment: (NOTE) SARS-CoV-2 target nucleic acids are NOT DETECTED.  The SARS-CoV-2 RNA is generally detectable in upper respiratory specimens during the acute phase of infection. The lowest concentration of SARS-CoV-2 viral copies this assay can detect is 138 copies/mL. A negative result does not preclude SARS-Cov-2 infection and should  not be used as the sole basis for treatment or other patient management decisions. A negative result may occur with  improper specimen collection/handling, submission of specimen other than nasopharyngeal swab, presence of viral mutation(s) within the areas targeted by this assay, and inadequate number of viral copies(<138 copies/mL). A negative result must be combined with clinical observations, patient history, and epidemiological information. The expected result is Negative.  Fact Sheet for Patients:  EntrepreneurPulse.com.au  Fact Sheet for  Healthcare Providers:  IncredibleEmployment.be  This test is no t yet approved or cleared by the [place] FDA and  has been authorized for detection and/or diagnosis of SARS-CoV-2 by FDA under an Emergency Use Authorization (EUA). This EUA will remain  in effect (meaning this test can be used) for the duration of the COVID-19 declaration under Section 564(b)(1) of the Act, 21 U.S.C.section 360bbb-3(b)(1), unless the authorization is terminated  or revoked sooner.       Influenza A by PCR NEGATIVE NEGATIVE Final   Influenza B by PCR NEGATIVE NEGATIVE Final    Comment: (NOTE) The Xpert Xpress SARS-CoV-2/FLU/RSV plus assay is intended as an aid in the diagnosis of influenza from Nasopharyngeal swab specimens and should not be used as a sole basis for treatment. Nasal washings and aspirates are unacceptable for Xpert Xpress SARS-CoV-2/FLU/RSV testing.  Fact Sheet for Patients: EntrepreneurPulse.com.au  Fact Sheet for Healthcare Providers: IncredibleEmployment.be  This test is not yet approved or cleared by the [place] FDA and has been authorized for detection and/or diagnosis of SARS-CoV-2 by FDA under an Emergency Use Authorization (EUA). This EUA will remain in effect (meaning this test can be used) for the duration of the COVID-19 declaration under Section 564(b)(1) of the Act, 21 U.S.C. section 360bbb-3(b)(1), unless the authorization is terminated or revoked.  Performed at [facility], [place] [address], [place], [place] [address]   Culture, blood (x 2)     Status: None   Collection Time: 12/18/21 12:40 AM   Specimen: BLOOD  Result Value Ref Range Status   Specimen Description   Final    BLOOD BLOOD RIGHT FOREARM Performed at [facility] [address] [place], [place] [address]    Special Requests   Final    BOTTLES DRAWN AEROBIC AND ANAEROBIC Blood Culture adequate  volume Performed at [facility] [address], [place], [place] [address]    Culture   Final    NO GROWTH 5 DAYS Performed at [facility], [place] [address], [place], [place] [address]    Report Status 12/23/2021 FINAL  Final  Culture, blood (x 2)     Status: None   Collection Time: 12/18/21 12:40 AM   Specimen: BLOOD  Result Value Ref Range Status   Specimen Description   Final    BLOOD BLOOD RIGHT HAND Performed at [facility] [place] [address], [place], [place] [address]    Special Requests   Final    BOTTLES DRAWN AEROBIC AND ANAEROBIC Blood Culture adequate volume Performed at [place] [address] [place], [place] [address]    Culture   Final    NO GROWTH 5 DAYS Performed at [facility], [place] [address] [place], [place] [address]    Report Status 12/23/2021 FINAL  Final  MRSA Next Gen by PCR, Nasal     Status: None   Collection Time: 12/18/21  2:20 PM   Specimen: Nasal Mucosa; Nasal Swab  Result  Value Ref Range Status   MRSA by PCR Next Gen NOT DETECTED NOT DETECTED Final    Comment: (NOTE) The GeneXpert MRSA Assay (FDA approved for NASAL specimens only), is one component of a comprehensive MRSA colonization surveillance program. It is not intended to diagnose MRSA infection nor to guide or monitor treatment for MRSA infections. Test performance is not FDA approved in patients less than 83 years old. Performed at [facility], [place] [address], [place], [place] [address]   Respiratory (~20 pathogens) panel by PCR     Status: None   Collection Time: 12/19/21  6:30 PM   Specimen: Nasopharyngeal Swab; Respiratory  Result Value Ref Range Status   Adenovirus NOT DETECTED NOT DETECTED Final   Coronavirus 229E NOT DETECTED NOT DETECTED Final    Comment: (NOTE) The Coronavirus on the Respiratory Panel, DOES NOT test for the novel  Coronavirus (2019 nCoV)    Coronavirus HKU1  NOT DETECTED NOT DETECTED Final   Coronavirus NL63 NOT DETECTED NOT DETECTED Final   Coronavirus OC43 NOT DETECTED NOT DETECTED Final   Metapneumovirus NOT DETECTED NOT DETECTED Final   Rhinovirus / Enterovirus NOT DETECTED NOT DETECTED Final   Influenza A NOT DETECTED NOT DETECTED Final   Influenza B NOT DETECTED NOT DETECTED Final   Parainfluenza Virus 1 NOT DETECTED NOT DETECTED Final   Parainfluenza Virus 2 NOT DETECTED NOT DETECTED Final   Parainfluenza Virus 3 NOT DETECTED NOT DETECTED Final   Parainfluenza Virus 4 NOT DETECTED NOT DETECTED Final   Respiratory Syncytial Virus NOT DETECTED NOT DETECTED Final   Bordetella pertussis NOT DETECTED NOT DETECTED Final   Bordetella Parapertussis NOT DETECTED NOT DETECTED Final   Chlamydophila pneumoniae NOT DETECTED NOT DETECTED Final   Mycoplasma pneumoniae NOT DETECTED NOT DETECTED Final    Comment: Performed at [facility], [place]. [address] [place], [place] [address]  Respiratory (~20 pathogens) panel by PCR     Status: None   Collection Time: 12/20/21  4:26 PM   Specimen: Bronchoalveolar Lavage; Respiratory  Result Value Ref Range Status   Adenovirus NOT DETECTED NOT DETECTED Final   Coronavirus 229E NOT DETECTED NOT DETECTED Final    Comment: (NOTE) The Coronavirus on the Respiratory Panel, DOES NOT test for the novel  Coronavirus (2019 nCoV)    Coronavirus HKU1 NOT DETECTED NOT DETECTED Final   Coronavirus NL63 NOT DETECTED NOT DETECTED Final   Coronavirus OC43 NOT DETECTED NOT DETECTED Final   Metapneumovirus NOT DETECTED NOT DETECTED Final   Rhinovirus / Enterovirus NOT DETECTED NOT DETECTED Final   Influenza A NOT DETECTED NOT DETECTED Final   Influenza B NOT DETECTED NOT DETECTED Final   Parainfluenza Virus 1 NOT DETECTED NOT DETECTED Final   Parainfluenza Virus 2 NOT DETECTED NOT DETECTED Final   Parainfluenza Virus 3 NOT DETECTED NOT DETECTED Final   Parainfluenza Virus 4 NOT DETECTED NOT DETECTED Final    Respiratory Syncytial Virus NOT DETECTED NOT DETECTED Final   Bordetella pertussis NOT DETECTED NOT DETECTED Final   Bordetella Parapertussis NOT DETECTED NOT DETECTED Final   Chlamydophila pneumoniae NOT DETECTED NOT DETECTED Final   Mycoplasma pneumoniae NOT DETECTED NOT DETECTED Final    Comment: Performed at [facility], [place]. [address] [place], [place] [address]  Culture, Respiratory w Gram Stain     Status: None   Collection Time: 12/20/21  4:26 PM   Specimen: Bronchoalveolar Lavage; Respiratory  Result Value Ref Range Status   Specimen Description  Final    BRONCHIAL ALVEOLAR LAVAGE Performed at [facility] [address] [place], [place] [address]    Special Requests   Final    NONE Performed at [facility], [place] [address], [place], [place] [address]    Gram Stain NO WBC SEEN NO ORGANISMS SEEN   Final   Culture   Final    NO GROWTH Performed at [facility], [place] [address] [place], [place] [address]    Report Status 12/23/2021 FINAL  Final         Radiology Studies: No results found.      Scheduled Meds:  atorvastatin  20 mg Oral QPM   buPROPion  150 mg Oral q morning   cloNIDine  0.1 mg Oral QHS   enoxaparin (LOVENOX) injection  40 mg Subcutaneous Q24H   FLUoxetine  40 mg Oral Daily   hydrOXYzine  25 mg Oral Daily   influenza vaccine adjuvanted  0.5 mL Intramuscular Tomorrow-1000   insulin aspart  0-15 Units Subcutaneous TID WC   lurasidone  40 mg Oral QHS   methylPREDNISolone (SOLU-MEDROL) injection  80 mg Intravenous Daily   nicotine  21 mg Transdermal Daily   senna-docusate  1 tablet Oral BID   sodium chloride flush  3 mL Intravenous Q12H   Continuous Infusions:     LOS: 9 days    Time spent: 35 minutes    Irine Seal, MD [facility]   To contact the attending provider between 7A-7P or the covering provider during after hours 7P-7A, please log into the web site  [email or web address] and access using universal [place] password for that web site. If you do not have the password, please call the hospital operator.  12/26/2021, 9:51 AM

## 2021-12-27 ENCOUNTER — Other Ambulatory Visit (HOSPITAL_COMMUNITY): Payer: Self-pay

## 2021-12-27 DIAGNOSIS — J189 Pneumonia, unspecified organism: Secondary | ICD-10-CM | POA: Diagnosis not present

## 2021-12-27 DIAGNOSIS — J9601 Acute respiratory failure with hypoxia: Secondary | ICD-10-CM | POA: Diagnosis not present

## 2021-12-27 DIAGNOSIS — S27309S Unspecified injury of lung, unspecified, sequela: Secondary | ICD-10-CM | POA: Diagnosis not present

## 2021-12-27 DIAGNOSIS — F32A Depression, unspecified: Secondary | ICD-10-CM | POA: Diagnosis not present

## 2021-12-27 DIAGNOSIS — E876 Hypokalemia: Secondary | ICD-10-CM

## 2021-12-27 LAB — CBC
HCT: 35.7 % — ABNORMAL LOW (ref 36.0–46.0)
Hemoglobin: 11.6 g/dL — ABNORMAL LOW (ref 12.0–15.0)
MCH: 29.2 pg (ref 26.0–34.0)
MCHC: 32.5 g/dL (ref 30.0–36.0)
MCV: 89.9 fL (ref 80.0–100.0)
Platelets: 432 10*3/uL — ABNORMAL HIGH (ref 150–400)
RBC: 3.97 MIL/uL (ref 3.87–5.11)
RDW: 14.4 % (ref 11.5–15.5)
WBC: 13.2 10*3/uL — ABNORMAL HIGH (ref 4.0–10.5)
nRBC: 0 % (ref 0.0–0.2)

## 2021-12-27 LAB — BASIC METABOLIC PANEL
Anion gap: 7 (ref 5–15)
BUN: 25 mg/dL — ABNORMAL HIGH (ref 8–23)
CO2: 29 mmol/L (ref 22–32)
Calcium: 9 mg/dL (ref 8.9–10.3)
Chloride: 103 mmol/L (ref 98–111)
Creatinine, Ser: 0.65 mg/dL (ref 0.44–1.00)
GFR, Estimated: >60 mL/min (ref >60)
Glucose, Bld: 111 mg/dL — ABNORMAL HIGH (ref 70–99)
Potassium: 4.5 mmol/L (ref 3.5–5.1)
Sodium: 139 mmol/L (ref 135–145)

## 2021-12-27 LAB — MAGNESIUM: Magnesium: 2.4 mg/dL (ref 1.7–2.4)

## 2021-12-27 LAB — GLUCOSE, CAPILLARY
Glucose-Capillary: 95 mg/dL (ref 70–99)
Glucose-Capillary: 168 mg/dL — ABNORMAL HIGH (ref 70–99)

## 2021-12-27 MED ORDER — DAPSONE 100 MG PO TABS
100.0000 mg | ORAL_TABLET | Freq: Every day | ORAL | 1 refills | Status: DC
  Filled 2021-12-27: qty 30, 30d supply, fill #0
  Filled 2021-12-28: qty 30, 30d supply, fill #1

## 2021-12-27 MED ORDER — PREDNISONE 20 MG PO TABS
ORAL_TABLET | ORAL | 0 refills | Status: AC
  Filled 2021-12-27: qty 75, 30d supply, fill #0

## 2021-12-27 MED ORDER — PANTOPRAZOLE SODIUM 40 MG PO TBEC
40.0000 mg | DELAYED_RELEASE_TABLET | Freq: Every day | ORAL | 1 refills | Status: AC
  Filled 2021-12-27: qty 30, 30d supply, fill #0

## 2021-12-27 MED ORDER — PANTOPRAZOLE SODIUM 40 MG PO TBEC
40.0000 mg | DELAYED_RELEASE_TABLET | Freq: Every day | ORAL | Status: DC
  Administered 2021-12-27: 40 mg via ORAL
  Filled 2021-12-27: qty 1

## 2021-12-27 MED ORDER — NICOTINE 21 MG/24HR TD PT24
21.0000 mg | MEDICATED_PATCH | Freq: Every day | TRANSDERMAL | 1 refills | Status: DC
  Filled 2021-12-27: qty 28, 28d supply, fill #0

## 2021-12-27 MED ORDER — LORAZEPAM 0.5 MG PO TABS
0.5000 mg | ORAL_TABLET | Freq: Three times a day (TID) | ORAL | 0 refills | Status: DC | PRN
  Filled 2021-12-27: qty 15, 5d supply, fill #0

## 2021-12-27 MED ORDER — SENNOSIDES-DOCUSATE SODIUM 8.6-50 MG PO TABS: 1.0000 | ORAL_TABLET | Freq: Two times a day (BID) | ORAL | Status: DC

## 2021-12-27 MED ORDER — OXYCODONE HCL 5 MG PO TABS
5.0000 mg | ORAL_TABLET | ORAL | 0 refills | Status: DC | PRN
  Filled 2021-12-27: qty 15, 3d supply, fill #0

## 2021-12-27 NOTE — Progress Notes (Signed)
Mobility Specialist - Progress Note   12/27/21 1005  Oxygen Therapy  O2 Device Nasal Cannula  O2 Flow Rate (L/min) 1 L/min  Mobility  Activity Ambulated independently in hallway  Level of Assistance Independent  Assistive Device None  Distance Ambulated (ft) 350 ft  Activity Response Tolerated well  Mobility Referral Yes  $Mobility charge 1 Mobility   Pt received in bed and agreeable to mobility. No complaints during mobility. Pt to bed after session with all needs met.      [facility]

## 2021-12-27 NOTE — TOC Progression Note (Signed)
Transition of Care [facility]) - Progression Note    Patient Details  Name: Erin [name] MRN: [id number] Date of Birth: Sep 16, 1950  [facility] CM/SW Contact  Golda Acre, RN Phone Number: 12/27/2021, 2:28 PM  Clinical Narrative:    Home o2 ordered through [employer]   Expected Discharge Plan: Home w Home Health Services Barriers to Discharge: Continued Medical Work up  Expected Discharge Plan and Services Expected Discharge Plan: Home w Home Health Services   Discharge Planning Services: CM Consult Post Acute Care Choice: Durable Medical Equipment Living arrangements for the past 2 months: Single Family Home                 DME Arranged: Oxygen DME Agency: [employer] Date DME Agency Contacted: 12/27/21 Time DME Agency Contacted: 57 Representative spoke with at DME Agency: Elie Goody             Social Determinants of Health (SDOH) Interventions    Readmission Risk Interventions   No data to display

## 2021-12-27 NOTE — Discharge Summary (Signed)
Physician Discharge Summary  Erin [name] QIH:[id number] DOB: [birth year] DOA: 12/17/2021  PCP: Aura Dials, PA-C  Admit date: 12/17/2021 Discharge date: 12/27/2021  Time spent: 65 minutes  Recommendations for Outpatient Follow-up:  Follow-up with Aura Dials, PA-C in 2 weeks.  On follow-up patient need a basic metabolic profile done to follow-up on electrolytes and renal function.  Patient's depression/anxiety will need to be followed up upon.  Liver lesion will need to be followed up upon and once pulmonary issues have resolved will need a nonemergent MRI of the abdomen for further evaluation.  Repeat ambulatory sats will need to be done. Follow-up with Dr. Vaughan Browner, on 01/17/2022 at 2:30 PM.  Ambulatory sats will need to be done on follow-up.   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  Diet recommendation: Heart healthy  Filed Weights   12/17/21 1657 12/25/21 1601  Weight: 91.6 kg 90.7 kg    History of present illness:  HPI per Dr Dorothy Puffer is a 71 y.o. female with medical history significant for depression, anxiety, and history of alcohol abuse in remission who presents emergency department for evaluation of cough and shortness of breath.   Patient initially sought care at an urgent care for 2 days of progressive cough and shortness of breath, was found to be hypoxic, and directed to the ED.     ED Course: Upon arrival to the ED, patient is found to be afebrile initially but later becoming febrile, saturating well on 4 L/min of supplemental oxygen,  tachypneic, and with stable blood pressure.  EKG features sinus or ectopic atrial rhythm with nonspecific IVCD, repolarization abnormality, and QTc interval 507 ms.  Chest x-ray concerning for multifocal pneumonia.  Labs notable for potassium 3.3, alkaline phosphatase 209, WBC 12,800, platelets 409,000, normal troponin, normal BNP, and negative COVID and influenza PCR.  Patient was given Rocephin and azithromycin in the ED.   Hospital Course:  #1 acute hypoxemic respiratory failure, POA secondary to ALI/ pneumonia -Patient noted to have presented with 2-day history of progressive cough, shortness of breath, noted to be hypoxemic. -CT angiogram negative for PE but did show diffuse groundglass opacities raising concern for a broad list of differential diagnosis. -Due to increased O2 requirements going up as high as 15 L high flow nasal cannula and required BiPAP PCCM consulted. 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 -Patient given a dose of IV Lasix (12/20/2021) with urine output not properly recorded -Patient started on high-dose IV Solu-Medrol 125 mg every 6 hours with clinical improvement and dose decreased to 80 mg daily (11/10) per pulmonary with O2 sats down to 2 L high flow nasal cannula today.   -No need any further BiPAP for 3 to 4 days prior to discharge.-Serology ordered per PCCM including vasculitis panel, QuantiFERON gold, urine Legionella antigen, urine strep pneumococcus antigen. -Doxycycline discontinued per PCCM and patient status post 5-day course of IV Rocephin.  -PCCM recommending BiPAP nightly and daytime 1-2 applications. -Per PCCM  May DC daily BiPAP once down to < 6 L and continue BiPAP nightly and may DC nightly BiPAP once < 4 L O2 requirements. -, O2 requirements improved and IV Solu-Medrol was changed to oral prednisone 60 mg daily per PCCM with  slow steroid taper of 10 mg decrease per week. -Patient subsequently started on dapsone 100 mg daily for PCP prophylaxis per PCCM recommendations which patient will be discharged home on.  Unable to use Bactrim due to QT prolongation. -Patient will be discharged on home O2 with repeat ambulatory sats done in the outpatient setting. -Outpatient follow-up with pulmonary/PCCM, Dr. Vaughan Browner, on 01/17/2022 at 2:30 PM.   -Patient will be discharged in stable and improved condition on home oxygen.   2.  Severe sepsis due to pneumonia -Patient on admission met criteria for severe sepsis with multifocal pneumonia concerns, fever, leukocytosis, tachypnea, acute hypoxic respiratory failure and placed empirically on IV Rocephin and azithromycin in the ED. -Status post full course of antibiotic treatment. -Due to increased O2 requirement CT angiogram chest on 12/19/2021 negative for PE however notable for extensive be crazy paving pattern with be groundglass opacities and interlobular septal thickening. -2D echo done EF 60 to 65%,NWMA. -See problem #1 above.   3.  Hypertension -Patient maintained on home regimen clonidine.     4.  Depression/anxiety -Stable.   -Patient was maintained on her home regimen Prozac, Latuda, Wellbutrin, hydroxyzine.  Patient also placed on Ativan as needed as husband recently passed a few days ago in the stepdown unit.  -Patient will be discharged on a few pills of Ativan. -Outpatient follow-up with PCP.   5.  Prolonged QT interval -QTc noted at 507 on presentation in the ED. -Repeat EKG with RBBB with no significant change from prior EKG, some improvement with QTc. -Electrolytes repleted. -Outpatient follow-up.   6.  Liver lesion -Incidental 5.3 cm right lobe lesion on CTA of chest noted. -Recommend nonemergent abdominal MRI once pulmonary issues have resolved which may be done in the outpatient setting.    Procedures: Flexible bronchoscopy, BAL of the lingula per PCCM,  Dr. Chase Caller 12/20/2021 CT angiogram chest 12/19/2021 Chest x-ray 12/17/2021, 12/20/2021, 12/21/2021 2D echo 12/20/2021   Significant Hospital Events:  12/17/2021 - admit 12/19/21 - ccm consult             - LDH 450             - CRP 25/ ESR 111             - BNP 107             - EOS 1200 (amdit 600)             - Quant GOld >> negative             - RVP 12/19/21 - NEG             - VASCULITIS PANEL >>             - SEROLOGY panel >>             - Urine strep/leg >>             - covid IgG - POSITIVE (2 year grand kid had covid 2 mo ago)   12/20/21 Bronch BAL              -11% eos, 86% PMN NO ALV hge             - repeat RVP neg             -  stARted solumedrol 142m Q6h               - 13L HFNC. Using BiPAP QHs and day time 1-2 times  Consultations: PCCM: Dr. RChase Caller11/07/2021   Discharge Exam: Vitals:   12/27/21 0435 12/27/21 1340  BP: 108/63 (!) 123/59  Pulse: 68 87  Resp: 16 16  Temp: 98.6 F (37 C) 99.6 F (37.6 C)  SpO2: 94% 95%    General: NAD Cardiovascular: RRR no murmurs rubs or gallops.  No JVD.  No lower extremity edema. Respiratory: Patient with some decreased coarse breath sounds in decreased fine crackles.  Fair air movement.  No use of accessory muscles of respiration.  Speaking in full sentences.  Discharge Instructions   Discharge Instructions     Diet - low sodium heart healthy   Complete by: As directed    Increase activity slowly   Complete by: As directed       Allergies as of 12/27/2021       Reactions   Lamictal [lamotrigine] Swelling   Caused swelling in the face   Vistaril [hydroxyzine Hcl] Swelling   Caused swelling in the face        Medication List     STOP taking these medications    benzonatate 100 MG capsule Commonly known as: TESSALON   chlordiazePOXIDE 25 MG capsule Commonly known as: LIBRIUM   silver sulfADIAZINE 1 % cream Commonly known as: SILVADENE       TAKE these medications    albuterol 108 (90  Base) MCG/ACT inhaler Commonly known as: VENTOLIN HFA Inhale 1-2 puffs into the lungs every 6 (six) hours as needed for wheezing or shortness of breath.   atorvastatin 20 MG tablet Commonly known as: LIPITOR Take 20 mg by mouth every evening.   buPROPion 150 MG 24 hr tablet Commonly known as: WELLBUTRIN XL Take 150 mg by mouth every morning.   cloNIDine 0.1 MG tablet Commonly known as: CATAPRES Take 0.1 mg by mouth at bedtime.   dapsone 100 MG tablet Take 1 tablet (100 mg total) by mouth daily. Start taking on: December 28, 2021   FLUoxetine 40 MG capsule Commonly known as: PROZAC Take 40 mg by mouth daily.   hydrOXYzine 25 MG tablet Commonly known as: ATARAX Take 25 mg by mouth daily.   Latuda 80 MG Tabs tablet Generic drug: lurasidone Take 40 mg by mouth at bedtime.   LORazepam 0.5 MG tablet Commonly known as: ATIVAN Take 1 tablet (0.5 mg total) by mouth every 8 (eight) hours as needed for anxiety.   nicotine 21 mg/24hr patch Commonly known as: NICODERM CQ - dosed in mg/24 hours Place 1 patch (21 mg total) onto the skin daily. Start taking on: December 28, 2021   oxyCODONE 5 MG immediate release tablet Commonly known as: Oxy IR/ROXICODONE Take 1 tablet (5 mg total) by mouth every 4 (four) hours as needed for moderate pain.   pantoprazole 40 MG tablet Commonly known as: PROTONIX Take 1 tablet (40 mg total) by mouth daily at 6 (six) AM. Start taking on: December 28, 2021   predniSONE 20 MG tablet Commonly known as: DELTASONE Take 3 tablets (60 mg total) by mouth daily with breakfast for 7 days, THEN 2.5 tablets (50 mg total) daily with breakfast for 7 days, THEN 2 tablets (40 mg total) daily with breakfast for 7 days, THEN 1.5 tablets (30 mg total) daily with breakfast for 14 days. Start taking on: December 28, 2021   senna-docusate  8.6-50 MG tablet Commonly known as: Senokot-S Take 1 tablet by mouth 2 (two) times daily.               Durable Medical  Equipment  (From admission, onward)           Start     Ordered   12/27/21 1424  For home use only DME oxygen  Once       Question Answer Comment  Length of Need 6 Months   Mode or (Route) Nasal cannula   Liters per Minute 2   Frequency Continuous (stationary and portable oxygen unit needed)   Oxygen conserving device Yes   Oxygen delivery system Gas      12/27/21 1423           Allergies  Allergen Reactions   Lamictal [Lamotrigine] Swelling    Caused swelling in the face   Vistaril [Hydroxyzine Hcl] Swelling    Caused swelling in the face    Follow-up Information     Aura Dials, PA-C. Schedule an appointment as soon as possible for a visit in 2 week(s).   Specialty: Physician Assistant Contact information: [place] [address] [phone]         Marshell Garfinkel, MD Follow up on 01/17/2022.   Specialty: Pulmonary Disease Why: Appt at 2:30 PM. Please arrive at 2:15 for check in process. Contact information: [place] [address] [phone]                  The results of significant diagnostics from this hospitalization (including imaging, microbiology, ancillary and laboratory) are listed below for reference.    Significant Diagnostic Studies: DG CHEST PORT 1 VIEW  Result Date: 12/21/2021 CLINICAL DATA:  Acute on chronic respiratory failure EXAM: PORTABLE CHEST 1 VIEW COMPARISON:  Chest radiograph dated 12/20/2021, CTA chest dated 12/19/2021 FINDINGS: Improved lung aeration. Decreased but persistent patchy and interstitial opacities throughout both lungs. Decreased areas of more confluent opacities in the left greater than right lung. No definite pleural effusion or pneumothorax. The heart size and mediastinal contours are within normal limits. The visualized skeletal structures are unremarkable. IMPRESSION: Improved lung aeration with decreased but persistent patchy and interstitial opacities  throughout both lungs. Electronically Signed   By: Darrin Nipper M.D.   On: 12/21/2021 10:20   DG CHEST PORT 1 VIEW  Result Date: 12/20/2021 CLINICAL DATA:  Respiratory distress EXAM: PORTABLE CHEST 1 VIEW COMPARISON:  Chest x-ray dated December 17, 2021 FINDINGS: The heart size and mediastinal contours are within normal limits. Diffuse heterogeneous opacities, increased when compared with prior chest x-ray. The visualized skeletal structures are unremarkable. IMPRESSION: Diffuse heterogeneous opacities, worsened when compared with prior radiograph, likely due to pulmonary edema. Electronically Signed   By: Yetta Glassman M.D.   On: 12/20/2021 16:48   ECHOCARDIOGRAM COMPLETE  Result Date: 12/20/2021    ECHOCARDIOGRAM REPORT   Patient Name:   Erin [name] Date of Exam: 12/20/2021 Medical Rec #:  [id number]              Height:       69.0 in Accession #:    [id number]             Weight:       202.0 lb Date of Birth:  October 10, 1950              BSA:          2.075 m Patient  Age:    12 years               BP:           141/61 mmHg Patient Gender: F                      HR:           77 bpm. Exam Location:  Inpatient Procedure: 2D Echo and Intracardiac Opacification Agent Indications:    CHF  History:        Patient has no prior history of Echocardiogram examinations.  Sonographer:    Harvie Junior Referring Phys: [id number] STEPHEN K CHIU  Sonographer Comments: Technically difficult study due to poor echo windows. IMPRESSIONS  1. Left ventricular ejection fraction, by estimation, is 60 to 65%. The left ventricle has normal function. The left ventricle has no regional wall motion abnormalities. Left ventricular diastolic parameters were normal.  2. Right ventricular systolic function is normal. The right ventricular size is normal. There is normal pulmonary artery systolic pressure.  3. The mitral valve is normal in structure. No evidence of mitral valve regurgitation. No evidence of mitral stenosis.  4. The aortic  valve is normal in structure. Aortic valve regurgitation is not visualized. No aortic stenosis is present.  5. The inferior vena cava is normal in size with greater than 50% respiratory variability, suggesting right atrial pressure of 3 mmHg. FINDINGS  Left Ventricle: Left ventricular ejection fraction, by estimation, is 60 to 65%. The left ventricle has normal function. The left ventricle has no regional wall motion abnormalities. Definity contrast agent was given IV to delineate the left ventricular  endocardial borders. The left ventricular internal cavity size was normal in size. There is no left ventricular hypertrophy. Left ventricular diastolic parameters were normal. Normal left ventricular filling pressure. Right Ventricle: The right ventricular size is normal. No increase in right ventricular wall thickness. Right ventricular systolic function is normal. There is normal pulmonary artery systolic pressure. The tricuspid regurgitant velocity is 2.44 m/s, and  with an assumed right atrial pressure of 3 mmHg, the estimated right ventricular systolic pressure is 81.1 mmHg. Left Atrium: Left atrial size was normal in size. Right Atrium: Right atrial size was normal in size. Pericardium: There is no evidence of pericardial effusion. Mitral Valve: The mitral valve is normal in structure. No evidence of mitral valve regurgitation. No evidence of mitral valve stenosis. Tricuspid Valve: The tricuspid valve is normal in structure. Tricuspid valve regurgitation is not demonstrated. No evidence of tricuspid stenosis. Aortic Valve: The aortic valve is normal in structure. Aortic valve regurgitation is not visualized. No aortic stenosis is present. Aortic valve mean gradient measures 3.0 mmHg. Aortic valve peak gradient measures 5.4 mmHg. Aortic valve area, by VTI measures 3.56 cm. Pulmonic Valve: The pulmonic valve was normal in structure. Pulmonic valve regurgitation is not visualized. No evidence of pulmonic stenosis.  Aorta: The aortic root is normal in size and structure. Venous: The inferior vena cava is normal in size with greater than 50% respiratory variability, suggesting right atrial pressure of 3 mmHg. IAS/Shunts: No atrial level shunt detected by color flow Doppler.  LEFT VENTRICLE PLAX 2D LVIDd:         4.70 cm      Diastology LVIDs:         3.20 cm      LV e' medial:    12.10 cm/s LV PW:         1.00  cm      LV E/e' medial:  8.2 LV IVS:        1.00 cm      LV e' lateral:   11.30 cm/s LVOT diam:     2.00 cm      LV E/e' lateral: 8.8 LV SV:         75 LV SV Index:   36 LVOT Area:     3.14 cm  LV Volumes (MOD) LV vol d, MOD A2C: 112.0 ml LV vol d, MOD A4C: 139.0 ml LV vol s, MOD A2C: 39.3 ml LV vol s, MOD A4C: 44.4 ml LV SV MOD A2C:     72.7 ml LV SV MOD A4C:     139.0 ml LV SV MOD BP:      87.7 ml RIGHT VENTRICLE RV Basal diam:  3.30 cm RV Mid diam:    3.20 cm TAPSE (M-mode): 2.2 cm LEFT ATRIUM           Index        RIGHT ATRIUM           Index LA diam:      3.50 cm 1.69 cm/m   RA Area:     13.40 cm LA Vol (A2C): 29.5 ml 14.22 ml/m  RA Volume:   29.60 ml  14.27 ml/m LA Vol (A4C): 40.0 ml 19.28 ml/m  AORTIC VALVE                    PULMONIC VALVE AV Area (Vmax):    3.47 cm     PV Vmax:       1.28 m/s AV Area (Vmean):   3.42 cm     PV Peak grad:  6.6 mmHg AV Area (VTI):     3.56 cm AV Vmax:           116.00 cm/s AV Vmean:          83.200 cm/s AV VTI:            0.210 m AV Peak Grad:      5.4 mmHg AV Mean Grad:      3.0 mmHg LVOT Vmax:         128.00 cm/s LVOT Vmean:        90.700 cm/s LVOT VTI:          0.238 m LVOT/AV VTI ratio: 1.13  AORTA Ao Root diam: 3.10 cm Ao Asc diam:  3.20 cm MITRAL VALVE               TRICUSPID VALVE MV Area (PHT): 3.53 cm    TR Peak grad:   23.8 mmHg MV Decel Time: 215 msec    TR Vmax:        244.00 cm/s MR Peak grad: 23.9 mmHg MR Vmax:      244.50 cm/s  SHUNTS MV E velocity: 99.40 cm/s  Systemic VTI:  0.24 m MV A velocity: 78.40 cm/s  Systemic Diam: 2.00 cm MV E/A ratio:  1.27 Mihai  Croitoru MD Electronically signed by Sanda Klein MD Signature Date/Time: 12/20/2021/1:06:55 PM    Final    CT Angio Chest Pulmonary Embolism (PE) W or WO Contrast  Result Date: 12/19/2021 CLINICAL DATA:  Shortness of breath. Evaluate for pulmonary embolism. EXAM: CT ANGIOGRAPHY CHEST WITH CONTRAST TECHNIQUE: Multidetector CT imaging of the chest was performed using the standard protocol during bolus administration of intravenous contrast. Multiplanar CT image reconstructions and MIPs were obtained to evaluate the vascular  anatomy. RADIATION DOSE REDUCTION: This exam was performed according to the departmental dose-optimization program which includes automated exposure control, adjustment of the mA and/or kV according to patient size and/or use of iterative reconstruction technique. CONTRAST:  38m OMNIPAQUE IOHEXOL 350 MG/ML SOLN COMPARISON:  Chest radiograph -12/17/2021 FINDINGS: Vascular Findings: There is adequate opacification of the pulmonary arterial system with the main pulmonary artery measuring 221 Hounsfield units. No discrete filling defects are seen within the pulmonary arterial tree to suggest pulmonary embolism. Normal caliber of the main pulmonary artery. Borderline cardiomegaly. Coronary artery calcifications. No pericardial effusion though a small amount of fluid is seen within the pericardial recess. No evidence of thoracic aortic aneurysm or dissection on this nongated examination. Bovine configuration of the aortic arch. Atherosclerotic plaque involving the aortic arch and descending thoracic aorta, not resulting in a hemodynamically significant stenosis. The descending thoracic aorta is tortuous but of normal caliber. Review of the MIP images confirms the above findings. ---------------------------------------------------------------------------------- Nonvascular Findings: Mediastinum/Lymph Nodes: Borderline enlarged mediastinal and hilar lymph nodes with index precarinal lymph node  measuring 1 cm in greatest short axis diameter (image 30, series 4), index prevascular lymph node measuring 0.9 cm (image 72), index right paratracheal lymph node measuring 0.9 cm (image 18), index right infrahilar lymph node measuring 0.9 cm (image 43, series 4 and index left hilar nodal conglomeration measuring 1.0 (image 35) and 0.9 cm (image 40, series 4), nonspecific. No axillary lymphadenopathy. Lungs/Pleura: Extensive bilateral crazy paving pattern as evidenced by extensive ground-glass opacities and interlobular septal thickening, progressed compared to chest radiograph performed 12/17/2021. Minimal subpleural atelectasis involving the anterior aspects of the bilateral upper lobes. No associated air bronchograms. The central pulmonary airways appear widely patent. Trace bilateral pleural effusions, left-greater-than-right. Evaluation for discrete pulmonary nodules is degraded secondary to extensive parenchymal abnormalities. Upper abdomen: Limited early arterial phase evaluation of the upper abdomen demonstrates an approximately 5.3 x 3.4 cm hypoattenuating lesion involving the central aspect of the right lobe of the liver (image 81, series 4), incompletely evaluated on the present examination though potentially representative of a minimally complex hepatic cyst. Musculoskeletal: No acute or aggressive osseous abnormalities. Incidental note made of a right-sided cervical rib. Moderate DDD of C6-C7 with disc space height loss, endplate irregularity and sclerosis. Regional soft tissues appear normal. Normal appearance of the thyroid gland. IMPRESSION: 1. No evidence of pulmonary embolism. 2. Extensive bilateral crazy paving pattern characterized by extensive bilateral ground-glass opacities and interlobular septal thickening. Differential considerations are broad and include pulmonary edema, diffuse alveolar hemorrhage, ARDS, acute hypersensitivity pneumonitis and (favored) atypical infection (including  COVID-19 pneumonia). Clinical correlation is advised. Follow-up chest radiograph in 4-6 weeks is recommended to ensure resolution. 3. Borderline enlarged mediastinal and hilar lymphadenopathy, nonspecific though presumably reactive in etiology. 4. Borderline cardiomegaly. 5. Coronary artery calcifications. Aortic Atherosclerosis (ICD10-I70.0). 6. Indeterminate approximately 5.3 cm hypoattenuating lesion within the central aspect of the right lobe of the liver, potentially representative of a complex hepatic cyst, though incompletely evaluated on the present examination. Comparison with prior outside examinations (if available), is advised. Otherwise, further evaluation with nonemergent abdominal MRI is recommended for further characterization. Electronically Signed   By: JSandi MariscalM.D.   On: 12/19/2021 16:22   DG Chest 2 View  Result Date: 12/17/2021 CLINICAL DATA:  Shortness of breath and cough. EXAM: CHEST - 2 VIEW COMPARISON:  None Available. FINDINGS: The heart is normal in size. There is aortic tortuosity and atherosclerosis. Suspected retrocardiac hiatal hernia. Heterogeneous bilateral airspace disease most prominent  in bilateral infrahilar regions. No significant pleural effusion. No pneumothorax. Limited assessment, no acute osseous findings. IMPRESSION: 1. Heterogeneous bilateral airspace disease most prominent in bilateral infrahilar regions, suspicious for multifocal pneumonia. 2. Suspected retrocardiac hiatal hernia. Electronically Signed   By: Keith Rake M.D.   On: 12/17/2021 17:20    Microbiology: Recent Results (from the past 240 hour(s))  Resp Panel by RT-PCR (Flu A&B, Covid) Anterior Nasal Swab     Status: None   Collection Time: 12/17/21  6:24 PM   Specimen: Anterior Nasal Swab  Result Value Ref Range Status   SARS Coronavirus 2 by RT PCR NEGATIVE NEGATIVE Final    Comment: (NOTE) SARS-CoV-2 target nucleic acids are NOT DETECTED.  The SARS-CoV-2 RNA is generally detectable  in upper respiratory specimens during the acute phase of infection. The lowest concentration of SARS-CoV-2 viral copies this assay can detect is 138 copies/mL. A negative result does not preclude SARS-Cov-2 infection and should not be used as the sole basis for treatment or other patient management decisions. A negative result may occur with  improper specimen collection/handling, submission of specimen other than nasopharyngeal swab, presence of viral mutation(s) within the areas targeted by this assay, and inadequate number of viral copies(<138 copies/mL). A negative result must be combined with clinical observations, patient history, and epidemiological information. The expected result is Negative.  Fact Sheet for Patients:  EntrepreneurPulse.com.au  Fact Sheet for Healthcare Providers:  IncredibleEmployment.be  This test is no t yet approved or cleared by the [place] FDA and  has been authorized for detection and/or diagnosis of SARS-CoV-2 by FDA under an Emergency Use Authorization (EUA). This EUA will remain  in effect (meaning this test can be used) for the duration of the COVID-19 declaration under Section 564(b)(1) of the Act, 21 U.S.C.section 360bbb-3(b)(1), unless the authorization is terminated  or revoked sooner.       Influenza A by PCR NEGATIVE NEGATIVE Final   Influenza B by PCR NEGATIVE NEGATIVE Final    Comment: (NOTE) The Xpert Xpress SARS-CoV-2/FLU/RSV plus assay is intended as an aid in the diagnosis of influenza from Nasopharyngeal swab specimens and should not be used as a sole basis for treatment. Nasal washings and aspirates are unacceptable for Xpert Xpress SARS-CoV-2/FLU/RSV testing.  Fact Sheet for Patients: EntrepreneurPulse.com.au  Fact Sheet for Healthcare Providers: IncredibleEmployment.be  This test is not yet approved or cleared by the [place] FDA and has  been authorized for detection and/or diagnosis of SARS-CoV-2 by FDA under an Emergency Use Authorization (EUA). This EUA will remain in effect (meaning this test can be used) for the duration of the COVID-19 declaration under Section 564(b)(1) of the Act, 21 U.S.C. section 360bbb-3(b)(1), unless the authorization is terminated or revoked.  Performed at [facility], [place] [address] [place], [place] [address]   Culture, blood (x 2)     Status: None   Collection Time: 12/18/21 12:40 AM   Specimen: BLOOD  Result Value Ref Range Status   Specimen Description   Final    BLOOD BLOOD RIGHT FOREARM Performed at [place] [address], [place], [place] [address]    Special Requests   Final    BOTTLES DRAWN AEROBIC AND ANAEROBIC Blood Culture adequate volume Performed at [place] [address] [place], [place] [address]    Culture   Final    NO GROWTH 5 DAYS Performed at [facility], [place] [address], [place], [place] [address]  Report Status 12/23/2021 FINAL  Final  Culture, blood (x 2)     Status: None   Collection Time: 12/18/21 12:40 AM   Specimen: BLOOD  Result Value Ref Range Status   Specimen Description   Final    BLOOD BLOOD RIGHT HAND Performed at [place] [address], [place], [place] [address]    Special Requests   Final    BOTTLES DRAWN AEROBIC AND ANAEROBIC Blood Culture adequate volume Performed at [place] [address] [place], [place] [address]    Culture   Final    NO GROWTH 5 DAYS Performed at [facility], [place] [address] [place], [place] [address]    Report Status 12/23/2021 FINAL  Final  MRSA Next Gen by PCR, Nasal     Status: None   Collection Time: 12/18/21  2:20 PM   Specimen: Nasal Mucosa; Nasal Swab  Result Value Ref Range Status   MRSA by PCR Next Gen NOT DETECTED NOT DETECTED Final    Comment: (NOTE) The GeneXpert  MRSA Assay (FDA approved for NASAL specimens only), is one component of a comprehensive MRSA colonization surveillance program. It is not intended to diagnose MRSA infection nor to guide or monitor treatment for MRSA infections. Test performance is not FDA approved in patients less than 84 years old. Performed at [facility], [place] [address] [place], [place] [address]   Respiratory (~20 pathogens) panel by PCR     Status: None   Collection Time: 12/19/21  6:30 PM   Specimen: Nasopharyngeal Swab; Respiratory  Result Value Ref Range Status   Adenovirus NOT DETECTED NOT DETECTED Final   Coronavirus 229E NOT DETECTED NOT DETECTED Final    Comment: (NOTE) The Coronavirus on the Respiratory Panel, DOES NOT test for the novel  Coronavirus (2019 nCoV)    Coronavirus HKU1 NOT DETECTED NOT DETECTED Final   Coronavirus NL63 NOT DETECTED NOT DETECTED Final   Coronavirus OC43 NOT DETECTED NOT DETECTED Final   Metapneumovirus NOT DETECTED NOT DETECTED Final   Rhinovirus / Enterovirus NOT DETECTED NOT DETECTED Final   Influenza A NOT DETECTED NOT DETECTED Final   Influenza B NOT DETECTED NOT DETECTED Final   Parainfluenza Virus 1 NOT DETECTED NOT DETECTED Final   Parainfluenza Virus 2 NOT DETECTED NOT DETECTED Final   Parainfluenza Virus 3 NOT DETECTED NOT DETECTED Final   Parainfluenza Virus 4 NOT DETECTED NOT DETECTED Final   Respiratory Syncytial Virus NOT DETECTED NOT DETECTED Final   Bordetella pertussis NOT DETECTED NOT DETECTED Final   Bordetella Parapertussis NOT DETECTED NOT DETECTED Final   Chlamydophila pneumoniae NOT DETECTED NOT DETECTED Final   Mycoplasma pneumoniae NOT DETECTED NOT DETECTED Final    Comment: Performed at [facility], [place]. [address], [place], [place] [address]  Respiratory (~20 pathogens) panel by PCR     Status: None   Collection Time: 12/20/21  4:26 PM   Specimen: Bronchoalveolar Lavage; Respiratory  Result Value Ref Range Status    Adenovirus NOT DETECTED NOT DETECTED Final   Coronavirus 229E NOT DETECTED NOT DETECTED Final    Comment: (NOTE) The Coronavirus on the Respiratory Panel, DOES NOT test for the novel  Coronavirus (2019 nCoV)    Coronavirus HKU1 NOT DETECTED NOT DETECTED Final   Coronavirus NL63 NOT DETECTED NOT DETECTED Final   Coronavirus OC43 NOT DETECTED NOT DETECTED Final   Metapneumovirus NOT DETECTED NOT DETECTED Final   Rhinovirus / Enterovirus NOT DETECTED NOT  DETECTED Final   Influenza A NOT DETECTED NOT DETECTED Final   Influenza B NOT DETECTED NOT DETECTED Final   Parainfluenza Virus 1 NOT DETECTED NOT DETECTED Final   Parainfluenza Virus 2 NOT DETECTED NOT DETECTED Final   Parainfluenza Virus 3 NOT DETECTED NOT DETECTED Final   Parainfluenza Virus 4 NOT DETECTED NOT DETECTED Final   Respiratory Syncytial Virus NOT DETECTED NOT DETECTED Final   Bordetella pertussis NOT DETECTED NOT DETECTED Final   Bordetella Parapertussis NOT DETECTED NOT DETECTED Final   Chlamydophila pneumoniae NOT DETECTED NOT DETECTED Final   Mycoplasma pneumoniae NOT DETECTED NOT DETECTED Final    Comment: Performed at [facility], [place] [address], [place], [place] [address]  Culture, Respiratory w Gram Stain     Status: None   Collection Time: 12/20/21  4:26 PM   Specimen: Bronchoalveolar Lavage; Respiratory  Result Value Ref Range Status   Specimen Description   Final    BRONCHIAL ALVEOLAR LAVAGE Performed at [facility] [address], [place] [address]    Special Requests   Final    NONE Performed at [facility], [place] [address] [place], [place] [address]    Gram Stain NO WBC SEEN NO ORGANISMS SEEN   Final   Culture   Final    NO GROWTH Performed at [facility], [place] [address] [place], [place] [address]    Report Status 12/23/2021 FINAL  Final     Labs: Basic Metabolic Panel: Recent Labs  Lab 12/22/21 0313 12/23/21 0300  12/24/21 0335 12/25/21 0352 12/26/21 0351 12/27/21 0336  NA 137 138 136 137 134* 139  K 4.0 4.2 5.0 4.0 3.6 4.5  CL 101 103 106 104 101 103  CO2 _0 GLUCOSE 144* 147* 102* 88 119* 111*  BUN 29* 29* _1 25*  CREATININE 0.65 0.56 0.60 0.58 0.51 0.65  CALCIUM 9.3 9.3 8.6* 8.6* 8.5* 9.0  MG 2.1  --  2.5* 2.4  --  2.4   Liver Function Tests: Recent Labs  Lab 12/21/21 0327  AST 35  ALT 33  ALKPHOS 201*  BILITOT 0.6  PROT 6.5  ALBUMIN 2.4*   No results for input(s): "LIPASE", "AMYLASE" in the last 168 hours. No results for input(s): "AMMONIA" in the last 168 hours. CBC: Recent Labs  Lab 12/22/21 0313 12/23/21 0300 12/24/21 0335 12/25/21 0352 12/26/21 0351 12/27/21 0336  WBC 13.9* 13.0* 10.5 13.4* 13.0* 13.2*  NEUTROABS 12.2*  --  6.7 8.3* 8.8*  --   HGB 10.7* 10.5* 10.8* 11.2* 11.4* 11.6*  HCT 32.2* 32.7* 34.5* 35.4* 36.2 35.7*  MCV 86.8 89.3 91.8 91.0 91.0 89.9  PLT 424* 433* 310 416* 413* 432*   Cardiac Enzymes: No results for input(s): "CKTOTAL", "CKMB", "CKMBINDEX", "TROPONINI" in the last 168 hours. BNP: BNP (last 3 results) Recent Labs    12/17/21 1850 12/19/21 1855  BNP 80.3 107.0*    ProBNP (last 3 results) No results for input(s): "PROBNP" in the last 8760 hours.  CBG: Recent Labs  Lab 12/26/21 1138 12/26/21 1823 12/26/21 1939 12/27/21 0721 12/27/21 1143  GLUCAP 101* 183* 168* 95 168*       Signed:  Irine Seal MD.  [facility] 12/27/2021, 2:28 PM

## 2021-12-27 NOTE — Progress Notes (Signed)
Mobility Specialist - Progress Note   12/27/21 1403  Oxygen Therapy  O2 Device Nasal Cannula  O2 Flow Rate (L/min) 1 L/min  Mobility  Activity Ambulated independently in hallway  Level of Assistance Independent  Assistive Device None  Distance Ambulated (ft) 350 ft  Activity Response Tolerated well  Mobility Referral Yes  $Mobility charge 1 Mobility   MD requested Mobility Specialist to perform oxygen saturation test with pt which includes removing pt from oxygen both at rest and while ambulating.  Below are the results from that testing.     Patient Saturations on Room Air at Rest = spO2 92%  Patient Saturations on Room Air while Ambulating = sp02 87% .  Rested and performed pursed lip breathing for 1 minute with sp02 at 85%.   Patient Saturations on 1 Liters of oxygen while Ambulating = sp02 95%  At end of testing pt left in room on 1  Liters of oxygen.  Pt received in bed and agreeable to mobility. No complaints during mobility. Pt did desat to 85% after being put on 1L of O2 during mobility. Took ~69mn for O2 levels to come back up to 95% . Pt to bed after session with all needs met.    Reported results to nurse.    Pre-mobility: 110bpm HR During mobility: 130bpm HR  Post-mobility: 97bpm HR  MSet designer

## 2021-12-28 ENCOUNTER — Other Ambulatory Visit (HOSPITAL_COMMUNITY): Payer: Self-pay

## 2021-12-29 NOTE — Telephone Encounter (Signed)
Patient is scheduled with PM on 12/5 at 2:30pm per [facility] when patient was discharged - nothing further needed.

## 2022-01-06 ENCOUNTER — Other Ambulatory Visit (HOSPITAL_COMMUNITY): Payer: Self-pay

## 2022-01-11 LAB — ANTI-DNA ANTIBODY, DOUBLE-STRANDED: ds DNA Ab: <1 IU/mL (ref 0–9)

## 2022-01-11 LAB — SJOGRENS SYNDROME-A EXTRACTABLE NUCLEAR ANTIBODY: SSA (Ro) (ENA) Antibody, IgG: <0.2 AI (ref 0.0–0.9)

## 2022-01-11 LAB — ALLERGENS W/TOTAL IGE AREA 2
Alternaria Alternata IgE: <0.1 kU/L
Aspergillus Fumigatus IgE: <0.1 kU/L
Bermuda Grass IgE: <0.1 kU/L
Cat Dander IgE: <0.1 kU/L
Cedar, Mountain IgE: <0.1 kU/L
Cladosporium Herbarum IgE: <0.1 kU/L
Cockroach, German IgE: <0.1 kU/L
Common Silver Birch IgE: <0.1 kU/L
Cottonwood IgE: <0.1 kU/L
D Farinae IgE: 0.43 kU/L — AB
D Pteronyssinus IgE: 0.41 kU/L — AB
Dog Dander IgE: <0.1 kU/L
Elm, American IgE: <0.1 kU/L
IgE (Immunoglobulin E), Serum: 130 IU/mL (ref 6–495)
Johnson Grass IgE: <0.1 kU/L
Maple/Box Elder IgE: <0.1 kU/L
Mouse Urine IgE: <0.1 kU/L
Oak, White IgE: <0.1 kU/L
Pecan, Hickory IgE: <0.1 kU/L
Penicillium Chrysogen IgE: <0.1 kU/L
Pigweed, Rough IgE: <0.1 kU/L
Ragweed, Short IgE: <0.1 kU/L
Sheep Sorrel IgE Qn: <0.1 kU/L
Timothy Grass IgE: <0.1 kU/L
White Mulberry IgE: <0.1 kU/L

## 2022-01-11 LAB — QUANTIFERON-TB GOLD PLUS (RQFGPL)
QuantiFERON Mitogen Value: >10 IU/mL
QuantiFERON Nil Value: 0 IU/mL
QuantiFERON TB1 Ag Value: 0 IU/mL
QuantiFERON TB2 Ag Value: 0 IU/mL

## 2022-01-11 LAB — HYPERSENSITIVITY PNEUMONITIS
A. Pullulans Abs: NEGATIVE
A.Fumigatus #1 Abs: NEGATIVE
Micropolyspora faeni, IgG: NEGATIVE
Pigeon Serum Abs: NEGATIVE
Thermoact. Saccharii: NEGATIVE
Thermoactinomyces vulgaris, IgG: NEGATIVE

## 2022-01-11 LAB — ANCA TITERS
Atypical P-ANCA titer: <1:20 {titer}
C-ANCA: <1:20 {titer}
P-ANCA: <1:20 {titer}

## 2022-01-11 LAB — GLOMERULAR BASEMENT MEMBRANE ANTIBODIES: GBM Ab: <0.2 units (ref 0.0–0.9)

## 2022-01-11 LAB — ALDOLASE: Aldolase: 12 U/L — ABNORMAL HIGH (ref 3.3–10.3)

## 2022-01-11 LAB — SJOGRENS SYNDROME-B EXTRACTABLE NUCLEAR ANTIBODY: SSB (La) (ENA) Antibody, IgG: <0.2 AI (ref 0.0–0.9)

## 2022-01-11 LAB — RHEUMATOID FACTOR: Rheumatoid fact SerPl-aCnc: 16.8 IU/mL — ABNORMAL HIGH (ref <14.0)

## 2022-01-11 LAB — QUANTIFERON-TB GOLD PLUS: QuantiFERON-TB Gold Plus: NEGATIVE

## 2022-01-11 LAB — CYCLIC CITRUL PEPTIDE ANTIBODY, IGG/IGA: CCP Antibodies IgG/IgA: 2 units (ref 0–19)

## 2022-01-11 LAB — ANA: Anti Nuclear Antibody (ANA): NEGATIVE

## 2022-01-11 LAB — ANTI-SCLERODERMA ANTIBODY: Scleroderma (Scl-70) (ENA) Antibody, IgG: <0.2 AI (ref 0.0–0.9)

## 2022-01-17 ENCOUNTER — Inpatient Hospital Stay: Payer: Medicare Other | Admitting: Pulmonary Disease

## 2022-02-20 ENCOUNTER — Other Ambulatory Visit (HOSPITAL_COMMUNITY): Payer: Self-pay

## 2022-02-27 ENCOUNTER — Other Ambulatory Visit (HOSPITAL_COMMUNITY): Payer: Self-pay

## 2022-03-02 ENCOUNTER — Other Ambulatory Visit (HOSPITAL_COMMUNITY): Payer: Self-pay

## 2023-10-17 NOTE — Progress Notes (Signed)
 Patient ID:  Erin [name] is a 73 y.o. (DOB [birth year]) female.  Assessment and Plan   1. Mixed hyperlipidemia   2. Pre-diabetes   3. Rectal cancer (*)     Assessment & Plan 1. Squamous cell carcinoma: Acute. A rectal mass was identified during a colonoscopy on 09/27/2023, and it was confirmed to be squamous cell carcinoma.  - Scheduled to see Dr. Kelby at [facility] on 10/23/2023 for further management. - Blood work will be conducted today to monitor sugar levels, kidney function, and liver function.  2. Osteoporosis: Chronic. Bone density scan from 03/2022 indicated osteoporosis. - On Fosamax for the past 2 years, tolerating well without issues of acid reflux. - Taking calcium  and vitamin D supplements. - Engaging in weight-bearing exercises. -  - Potential increased risk of bone deterioration depending on treatment for squamous cell carcinoma, which Dr. Kelby will monitor.  3. Health maintenance: - Due for a mammogram, needs to schedule.  Follow-up - Follow-up visit scheduled in 8 weeks.   Results Diagnostic Testing  - Colonoscopy: 09/27/2023, Rectal mass that came back as squamous cell carcinoma.  - Bone Density Test: 03/2022, Osteoporosis.   HM needs: mammogram Patient verbalized to me that they understood what their problem is, what they need to do about it and why it is important that they do it. I have reviewed the information contained in this note and personally verified its accuracy.  I obtained the history of present illness and personally performed the physical exam Voice recognition software was used in creation of this note . Despite my best efforts at editing the text, some misspellings / errors  may have occurred.    Patient understands and agrees with plan. Computer technology was used to create visit note. Consent from the patient/caregiver was obtained prior to use.  Follow up in about 8 weeks (around 12/12/2023).    Patient's Medications        * Accurate as of October 17, 2023 11:19 AM. Reflects encounter med changes as of last refresh          Continued Medications      Instructions  alendronate 70 mg tablet Commonly known as: FOSAMAX  70 mg, Oral, Weekly   atorvastatin  80 mg tablet Commonly known as: LIPITOR  80 mg, Oral, Daily   buPROPion  HCl 150 mg 24 hr tablet Commonly known as: WELLBUTRIN  XL  150 mg, Oral, Every morning   busPIRone 5 mg tablet Commonly known as: BUSPAR  5 mg, Oral, 2 times a day as needed   CALCIUM  PO  Take by mouth.   cloNIDine  0.1 mg tablet Commonly known as: CATAPRES   0.1 mg   ezetimibe 10 MG tablet Commonly known as: ZETIA  10 mg, Oral, Daily   fluoxetine  40 MG capsule Commonly known as: PROZAC   TAKE 1 CAPSULE BY MOUTH EVERY DAY   hydrOXYzine  HCl 25 mg tablet Commonly known as: ATARAX   SMARTSIG:1-2 Tablet(s) By Mouth Daily PRN   lurasidone  HCl 40 mg Tabs tablet Commonly known as: LATUDA   20 mg, Daily   NA sulfate-potassium sulfate-magnesium  sulfate 17.5-3.13-1.6 GM/177ML Commonly known as: SUPREP BOWEL PREP KIT  177 mLs, Oral, 2 times a day bowel prep   pantoprazole  sodium 40 mg tablet Commonly known as: PROTONIX   40 mg, Oral, Daily   VITAMIN D PO  Take by mouth.       Discontinued Medications    naltrexone 50 mg tablet Commonly known as: REVIA Stopped by: Camie Franchot Mirza  Orders Placed This Encounter  Procedures  . Comprehensive Metabolic Panel  . Hemoglobin A1c    Risks, benefits, and alternatives of the medications and treatment plan prescribed today were discussed, and patient expressed understanding. Plan follow-up as discussed or as needed if any worsening symptoms or change in condition.    A yearly preventative health exam was recommended and current age based recommendations were discussed.   Subjective   Patient ID:  Erin [name] is a 73 y.o. (DOB [birth year]) female No chief complaint on file.     HPI:    History of Present Illness The patient is here for a follow-up visit.  She recently had a colonoscopy at [facility] with Dr. Lindaann on 09/27/2023, which revealed a rectal mass diagnosed as squamous cell carcinoma. She is scheduled to see Dr. Kelby from [facility] on 10/23/2023. She has informed her family about her diagnosis and is currently awaiting further treatment plans. She reports no alcohol consumption or cravings.  Checked in with patient today to see how she is emotionally handling the diagnosis Patient states she is anxious to see oncology to come up with a treatment plan but otherwise is doing well She has a history of alcoholism but has been sober for many years and is doing well She has good family and friend support  Patient is taking her Lipitor at bedtime with no side effects labs are up-to-date cholesterol levels are excellent  Results for orders placed or performed in visit on 07/17/23  Hemoglobin A1c  Result Value Ref Range   Hemoglobin A1c 5.9 (H) 4.8 - 5.6 %   Narrative   Performed at:  [address], [place], [place]  [id number] Lab Director: Frankey Sas MD, Phone:  [phone]  Results for orders placed or performed in visit on 11/15/22  Hemoglobin A1c  Result Value Ref Range   Hemoglobin A1c 5.7 (H) 4.8 - 5.6 %   Narrative   Performed at:  [address] [place], [place]  [id number] Lab Director: Frankey Sas MD, Phone:  [phone]     She has been on Fosamax for bone health for the past 2 years, which she tolerates well without any issues of acid reflux. She also takes calcium  and vitamin D supplements and engages in weight-bearing exercises. Her last bone density scan in 03/2022 showed osteoporosis.  She had previously scheduled a mammogram but had to cancel it and now needs to reschedule.  Occupation: Works part-time at [employer], approximately 20 hours per week. Alcohol:  Reports no alcohol consumption or cravings.    Past Medical History, Past Surgery History, Allergies, Social History, and Family History were reviewed and updated.    Review of Systems   Objective   Vitals:   10/17/23 1107  BP: 126/76  Pulse: 85  Temp: 98.5 F (36.9 C)  TempSrc: Oral  Resp: 14  Height: 5' 8 (1.727 m)  Weight: 222 lb (100.7 kg)  SpO2: 96%  BMI (Calculated): 33.8      Physical Exam     Voice recognition software was used and creation of this note. Despite my best effort at editing the text, some misspelling/errors may have occurred. *Some images could not be shown.

## 2023-10-18 NOTE — Progress Notes (Signed)
 Erin [name], It was great to see you yesterday. Your labs are back and are as follows.   The hemoglobin A1c is an average of blood sugars over 3 months.  The A1c is elevated at 5.9 and considered prediabetes. Please work on your diet, exercise and cut back on carbohydrates and sugars.    Your sodium, potassium, kidney, and liver functions are normal.  The protein levels are low. Please increase protein in your diet. SCS

## 2023-10-23 ENCOUNTER — Inpatient Hospital Stay
Admission: RE | Admit: 2023-10-23 | Discharge: 2023-10-23 | Disposition: A | Discharge status: Home / Self Care | Payer: Self-pay | Source: Ambulatory Visit | Attending: Oncology | Admitting: Oncology

## 2023-10-23 ENCOUNTER — Other Ambulatory Visit: Payer: Self-pay | Admitting: *Deleted

## 2023-10-23 DIAGNOSIS — C210 Malignant neoplasm of anus, unspecified: Secondary | ICD-10-CM

## 2023-10-24 ENCOUNTER — Other Ambulatory Visit: Payer: Self-pay | Admitting: *Deleted

## 2023-10-24 DIAGNOSIS — C210 Malignant neoplasm of anus, unspecified: Secondary | ICD-10-CM

## 2023-10-24 NOTE — Progress Notes (Signed)
 PATIENT NAVIGATOR PROGRESS NOTE  Name: Erin [name] Date: 10/24/2023 MRN: [id number]  DOB: 04/17/1950   Reason for visit: Introductory phone call  Comments:  Called and spoke with pt and scheduled her with Dr Cloretta on Monday, 10/29/23 at 1:40 pm. Discussed directions to building and parking as well as one support person allowed in the appt. Images  requested from [facility] to facilitate patient care.   We discussed referral to radiation oncology and pt appreciated. Referral placed.     Time spent counseling/coordinating care: 30-45 minutes

## 2023-10-25 ENCOUNTER — Ambulatory Visit (HOSPITAL_COMMUNITY): Admission: RE | Admit: 2023-10-25 | Source: Ambulatory Visit

## 2023-10-29 ENCOUNTER — Encounter: Payer: Self-pay | Admitting: *Deleted

## 2023-10-29 ENCOUNTER — Inpatient Hospital Stay (HOSPITAL_BASED_OUTPATIENT_CLINIC_OR_DEPARTMENT_OTHER): Admitting: Oncology

## 2023-10-29 ENCOUNTER — Other Ambulatory Visit: Payer: Self-pay | Admitting: *Deleted

## 2023-10-29 ENCOUNTER — Telehealth: Payer: Self-pay | Admitting: Radiation Oncology

## 2023-10-29 VITALS — BP 117/62 | HR 72 | Temp 97.8°F | Resp 18 | Ht 68.0 in | Wt 223.9 lb

## 2023-10-29 DIAGNOSIS — R911 Solitary pulmonary nodule: Secondary | ICD-10-CM | POA: Insufficient documentation

## 2023-10-29 DIAGNOSIS — F418 Other specified anxiety disorders: Secondary | ICD-10-CM | POA: Insufficient documentation

## 2023-10-29 DIAGNOSIS — F1020 Alcohol dependence, uncomplicated: Secondary | ICD-10-CM | POA: Insufficient documentation

## 2023-10-29 DIAGNOSIS — C210 Malignant neoplasm of anus, unspecified: Secondary | ICD-10-CM | POA: Insufficient documentation

## 2023-10-29 DIAGNOSIS — Z5111 Encounter for antineoplastic chemotherapy: Secondary | ICD-10-CM | POA: Insufficient documentation

## 2023-10-29 DIAGNOSIS — F17210 Nicotine dependence, cigarettes, uncomplicated: Secondary | ICD-10-CM | POA: Insufficient documentation

## 2023-10-29 DIAGNOSIS — C44311 Basal cell carcinoma of skin of nose: Secondary | ICD-10-CM | POA: Insufficient documentation

## 2023-10-29 DIAGNOSIS — Z800 Family history of malignant neoplasm of digestive organs: Secondary | ICD-10-CM | POA: Insufficient documentation

## 2023-10-29 DIAGNOSIS — K7689 Other specified diseases of liver: Secondary | ICD-10-CM | POA: Insufficient documentation

## 2023-10-29 DIAGNOSIS — E785 Hyperlipidemia, unspecified: Secondary | ICD-10-CM | POA: Insufficient documentation

## 2023-10-29 DIAGNOSIS — Z510 Encounter for antineoplastic radiation therapy: Secondary | ICD-10-CM | POA: Insufficient documentation

## 2023-10-29 MED ORDER — PROCHLORPERAZINE MALEATE 10 MG PO TABS: 10.0000 mg | ORAL_TABLET | Freq: Four times a day (QID) | ORAL | 0 refills | Status: DC | PRN

## 2023-10-29 NOTE — Telephone Encounter (Signed)
 Request from [place] to obtain records from Dr. Katopes @ [facility]. Called office @ [phone] to verify correct office and records available. This information was verified by Amy who also provided fax number [phone]. Request sent 9/15@10am .

## 2023-10-29 NOTE — Progress Notes (Signed)
 GI Location of Tumor / Histology: Anal Cancer  Colonoscopy 09/27/2023: 3.5 cm mass originating at the dentate line.    Biopsies of    Past/Anticipated interventions by surgeon, if any:   Past/Anticipated interventions by medical oncology, if any:  Dr. Cloretta 10/29/2023 - -Chemo start 11/12/2023  Dr. Katopes 10/23/2023 ([facility]) -I reviewed the fact that the recommendation is to treat with 5-FU + mitomycin  -. I reviewed the fact that 5-FU is given over a period of 96 hours. I reviewed the fact that it involves an infusional pump.  -She lives in [place]. She asked for time to review her options with her son and make decision whether or not she will receive her treatment at [facility] in [place] or [facility] in [place]. She called later this morning and told me that she has decided to transfer her care to [facility].  -A referral to [facility] has been entered.    Weight changes, if any: None  Bowel/Bladder complaints, if any: Denies  Nausea / Vomiting, if any: No  Pain issues, if any: No   Any blood per rectum:  No   Anal Patient -Abnormal pap smear: None  SAFETY ISSUES: Prior radiation? No Pacemaker/ICD? No Possible current pregnancy? Postmenopausal Is the patient on methotrexate? No  Current Complaints/Details:

## 2023-10-29 NOTE — Progress Notes (Signed)
 [facility] New Patient Consult   Requesting MD: [name], Pa-c [address] [name],  [place] [address]   Erin [name] 73 y.o.  1950-06-19    Reason for Consult: Anal cancer   HPI: [name] underwent a routine screening colonoscopy on 09/27/2023.  She was found to have a 3.5 cm mass originating from the dentate line (we do not have the colonoscopy report available today).  The pathology revealed moderately differentiated invasive squamous cell carcinoma.  The tumor is p16 positive with an elevated Ki-67.  A CT of the abdomen and pelvis on 10/08/2023 revealed no evidence of metastatic disease.  A CT chest lung cancer screening study 08/20/2023 revealed no suspicious pulmonary nodule.  A 4 mm subpleural nodule in the right middle lobe was unchanged compared to 05/11/2022.  Ms.Ballowe reports feeling completely well.  No difficulty with bowel function.  No rectal pain or bleeding.  She was referred to Dr. Kelby 10/23/2023.  Dr. Kelby recommends treatment with 5-FU/Mitomycin-C and concurrent radiation.  She lives in [place] and is referred for treatment closer to home.  Past Medical History:  Diagnosis Date   Alcohol abuse 11/08/2016   Alcoholism (HCC)    Depression    Hyperlipemia     .  G2, P2  Past Surgical History:  Procedure Laterality Date   NOSE SURGERY-for removal of a basal cell carcinoma      Medications: Reviewed  Allergies:  Allergies  Allergen Reactions   Lamictal [Lamotrigine] Swelling    Caused swelling in the face   Vistaril  [Hydroxyzine  Hcl] Swelling    Caused swelling in the face    Family history: Her father had liver cancer   Social History:   She lives alone in [place].  She works as a Futures trader.  She smokes 10 cigarettes/day.  She has a history of alcoholism, not drinking for the past year.  No transfusion history.  No risk factor for HIV or hepatitis.  ROS:   Positives  include: None  A complete ROS was otherwise negative.  Physical Exam:  Blood pressure 117/62, pulse 72, temperature 97.8 F (36.6 C), temperature source Temporal, resp. rate 18, height 5' 8 (1.727 m), weight 223 lb 14.4 oz (101.6 kg), SpO2 100%.  HEENT: Edentulous, upper denture plate, oropharynx without visible mass Lungs: End inspiratory rhonchi at the left posterior base, no respiratory distress Cardiac: Regular rate and rhythm Abdomen: No hepatosplenomegaly, nontender, no mass Rectal: Approximate 3 cm anterior anal canal mass Vascular: No leg edema Lymph nodes: No cervical, supraclavicular, axillary, or inguinal nodes Neurologic: Alert and oriented Skin: No rash Musculoskeletal: No spine tenderness   LAB:  CBC  Lab Results  Component Value Date   WBC 13.2 (H) 12/27/2021   HGB 11.6 (L) 12/27/2021   HCT 35.7 (L) 12/27/2021   MCV 89.9 12/27/2021   PLT 432 (H) 12/27/2021   NEUTROABS 8.8 (H) 12/26/2021        CMP  Lab Results  Component Value Date   NA 139 12/27/2021   K 4.5 12/27/2021   CL 103 12/27/2021   CO2 29 12/27/2021   GLUCOSE 111 (H) 12/27/2021   BUN 25 (H) 12/27/2021   CREATININE 0.65 12/27/2021   CALCIUM  9.0 12/27/2021   PROT 6.5 12/21/2021   ALBUMIN 2.4 (L) 12/21/2021   AST 35 12/21/2021   ALT 33 12/21/2021   ALKPHOS 201 (H) 12/21/2021   BILITOT 0.6 12/21/2021   GFRNONAA >60 12/27/2021  GFRAA >60 08/17/2016     Imaging:  As per HPI    Assessment/Plan:   Anal cancer, stage IIa (cT2, cN0) 09/27/2023 colonoscopy: Mass at the dentate line, 3.5 cm, biopsy-moderately differentiated squamous cell carcinoma, p16 positive, Ki-67 increased 08/20/2023-CT chest lung cancer screening-unchanged 4 mm subpleural nodule right middle lobe, no new nodules 10/08/2023-CT abdomen/pelvis-no evidence of metastatic disease, right hepatic cyst, shotty retroperitoneal and pelvic adenopathy-no pathologic adenopathy G2, P2 Basal cell carcinoma of the  nose Hyperlipidemia Anxiety/depression Alcoholism Tobacco use    Disposition:   Ms. Guilbault has been diagnosed with squamous cell carcinoma of the anus.  She has clinical stage II disease.  There is no clinical evidence of distant metastatic disease.  She is scheduled for radiation oncology consultation and a staging PET scan this week.  I recommend treatment with concurrent 5-FU/Mitomycin-C chemotherapy and radiation.  We reviewed potential toxicities associated with the chemotherapy regimen including the chance of nausea, mucositis, diarrhea, alopecia, hematologic toxicity, infection, and bleeding.  We discussed the cardiac toxicity, sun sensitivity, rash, hyperpigmentation, and hand/foot syndrome associated with 5-fluorouracil.  We reviewed the hemolytic uremic syndrome seen with Mitomycin-C therapy.  She agrees to proceed.  We discussed the likelihood of developing skin breakdown at the labia/perineum.  She will attend a chemotherapy teaching class.  She is at increased risk of skin toxicity from radiation smoking.  She will consult with her primary provider for smoking cessation.  She reports using nicotine  patch in the past.  The goal of treatment is cure.  The plan is to begin concurrent chemotherapy and radiation 11/12/2023.  She will be scheduled for PICC placement 11/09/2023.  A treatment plan was entered today.  Arley Hof, MD  10/29/2023, 2:15 PM

## 2023-10-29 NOTE — Addendum Note (Signed)
 Addended by: STEVA DEVERE CROME on: 10/29/2023 03:43 PM   Modules accepted: Orders

## 2023-10-29 NOTE — Progress Notes (Signed)
START ON PATHWAY REGIMEN - Anal Carcinoma     One cycle, concurrent with RT:     Fluorouracil      Mitomycin   **Always confirm dose/schedule in your pharmacy ordering system**  Patient Characteristics: Anal Canal Tumors, Newly Diagnosed - Locoregional Disease (Clinical Staging) Therapeutic Status: Newly Diagnosed - Locoregional Disease (Clinical Staging) AJCC T Category: cT2 AJCC N Category: cN0 AJCC M Category: cM0 AJCC 9 Stage Grouping: IIA Check here if patient was staged using an edition other than AJCC Staging 9th Edition: false Intent of Therapy: Curative Intent, Discussed with Patient

## 2023-10-29 NOTE — Progress Notes (Incomplete)
 Radiation Oncology         [phone] ________________________________  Name: Erin [name]        MRN: [id number]  Date of Service: 10/30/2023 DOB: 08/10/1950  RR:Dezwrzm, Camie Pepper, PA-C  Cloretta Arley NOVAK, MD     REFERRING PHYSICIAN: Cloretta Arley NOVAK, MD   DIAGNOSIS: The encounter diagnosis was Anal cancer [facility]).   HISTORY OF PRESENT ILLNESS: Erin [name] is a 73 y.o. female seen at the request of Dr. Cloretta for new diagnosis of anal carcinoma.  The patient was seen by her PCP who is a Therapist, sports in [place] and sent for screening colonoscopy as she was overdue for this and had a family history of colon polyps.  She was not having any symptoms.  She was referred to gastroenterology and underwent colonoscopy on 09/27/2023 which showed a 3.5 cm mass originating at the dentate line with a biopsy consistent with squamous cell carcinoma.  A prior CT in July 2025 of the chest showed a stable 4 mm subpleural nodule in the right middle lobe seen on prior imaging in March 2024.  A CT abdomen pelvis with IV contrast showed no definite metastatic disease.  She is scheduled to undergo a PET scan on 11/02/2023, and is seen to discuss definitive treatment for anal carcinoma.  She did meet with medical oncology through [facility] in the [place] clinic on 10/23/2023 but given that she lives in [place], prefers her treatment closer to home and is seen today.  Of note she did have HIV testing which was nonreactive on 10/23/2023 as well.  It does not appear that she has been referred to a colorectal surgeon for surveillance.    PREVIOUS RADIATION THERAPY: {EXAM; YES/NO:19492::No}   PAST MEDICAL HISTORY:  Past Medical History:  Diagnosis Date   Alcohol abuse 11/08/2016   Alcoholism (HCC)    Depression    Hyperlipemia        PAST SURGICAL HISTORY: Past Surgical History:  Procedure Laterality Date   NOSE SURGERY       FAMILY HISTORY:  Family History  Problem  Relation Age of Onset   Heart failure Father      SOCIAL HISTORY:  reports that she has been smoking cigarettes. She has never used smokeless tobacco. She reports current alcohol use. She reports that she does not use drugs.  The patient is married and lives in [place].  She***   ALLERGIES: Lamictal [lamotrigine] and Vistaril  [hydroxyzine  hcl]   MEDICATIONS:  Current Outpatient Medications  Medication Sig Dispense Refill   albuterol  (VENTOLIN  HFA) 108 (90 Base) MCG/ACT inhaler Inhale 1-2 puffs into the lungs every 6 (six) hours as needed for wheezing or shortness of breath. 18 g 0   atorvastatin  (LIPITOR) 20 MG tablet Take 20 mg by mouth every evening.     buPROPion  (WELLBUTRIN  XL) 150 MG 24 hr tablet Take 150 mg by mouth every morning.     cloNIDine  (CATAPRES ) 0.1 MG tablet Take 0.1 mg by mouth at bedtime.     dapsone  100 MG tablet Take 1 tablet (100 mg total) by mouth daily. 30 tablet 1   FLUoxetine  (PROZAC ) 40 MG capsule Take 40 mg by mouth daily.     hydrOXYzine  (ATARAX /VISTARIL ) 25 MG tablet Take 25 mg by mouth daily.     LATUDA  80 MG TABS tablet Take 40 mg by mouth at bedtime.     LORazepam  (ATIVAN ) 0.5 MG tablet Take 1 tablet (0.5 mg total) by mouth every 8 (eight) hours  as needed for anxiety. 15 tablet 0   nicotine  (NICODERM CQ  - DOSED IN MG/24 HOURS) 21 mg/24hr patch Place 1 patch (21 mg total) onto the skin daily. 28 patch 1   oxyCODONE  (OXY IR/ROXICODONE ) 5 MG immediate release tablet Take 1 tablet (5 mg total) by mouth every 4 (four) hours as needed for moderate pain. 15 tablet 0   pantoprazole  (PROTONIX ) 40 MG tablet Take 1 tablet (40 mg total) by mouth daily at 6 (six) AM. 30 tablet 1   senna-docusate (SENOKOT-S) 8.6-50 MG tablet Take 1 tablet by mouth 2 (two) times daily.     No current facility-administered medications for this visit.     REVIEW OF SYSTEMS: On review of systems, the patient reports that *** is doing well overall. *** denies any chest pain, shortness  of breath, cough, fevers, chills, night sweats, unintended weight changes. *** denies any bowel or bladder disturbances, and denies abdominal pain, nausea or vomiting. *** denies any new musculoskeletal or joint aches or pains. A complete review of systems is obtained and is otherwise negative.     PHYSICAL EXAM:  Wt Readings from Last 3 Encounters:  12/25/21 200 lb (90.7 kg)  05/17/20 212 lb (96.2 kg)  06/01/17 180 lb 3.2 oz (81.7 kg)   Temp Readings from Last 3 Encounters:  12/27/21 99.6 F (37.6 C) (Oral)  10/16/20 98.9 F (37.2 C) (Oral)  05/17/20 97.9 F (36.6 C) (Oral)   BP Readings from Last 3 Encounters:  12/27/21 (!) 123/59  10/16/20 134/84  05/17/20 122/68   Pulse Readings from Last 3 Encounters:  12/27/21 87  10/16/20 77  05/17/20 92    /10  In general this is a well appearing *** in no acute distress. She's alert and oriented x4 and appropriate throughout the examination. Cardiopulmonary assessment is negative for acute distress and she exhibits normal effort.     ECOG = ***  0 - Asymptomatic (Fully active, able to carry on all predisease activities without restriction)  1 - Symptomatic but completely ambulatory (Restricted in physically strenuous activity but ambulatory and able to carry out work of a light or sedentary nature. For example, light housework, office work)  2 - Symptomatic, <50% in bed during the day (Ambulatory and capable of all self care but unable to carry out any work activities. Up and about more than 50% of waking hours)  3 - Symptomatic, >50% in bed, but not bedbound (Capable of only limited self-care, confined to bed or chair 50% or more of waking hours)  4 - Bedbound (Completely disabled. Cannot carry on any self-care. Totally confined to bed or chair)  5 - Death   Raylene MM, Creech RH, Tormey DC, et al. [phone]). Toxicity and response criteria of the [facility]. Am. DOROTHA Bridges. Oncol. 5 (6):  [phone]    LABORATORY DATA:  Lab Results  Component Value Date   WBC 13.2 (H) 12/27/2021   HGB 11.6 (L) 12/27/2021   HCT 35.7 (L) 12/27/2021   MCV 89.9 12/27/2021   PLT 432 (H) 12/27/2021   Lab Results  Component Value Date   NA 139 12/27/2021   K 4.5 12/27/2021   CL 103 12/27/2021   CO2 29 12/27/2021   Lab Results  Component Value Date   ALT 33 12/21/2021   AST 35 12/21/2021   ALKPHOS 201 (H) 12/21/2021   BILITOT 0.6 12/21/2021      RADIOGRAPHY: No results found.     IMPRESSION/PLAN: 1. Squamous Cell  Carcinoma of the Anus. Dr. Dewey discusses the pathology findings and reviews the nature of *** .  We discussed the risks, benefits, short, and long term effects of radiotherapy, as well as the curative intent, and the patient is interested in proceeding. Dr. Dewey discusses the delivery and logistics of radiotherapy and anticipates a course of 5 1/2-6 weeks of radiotherapy pending her PET scan results.  Written consent is obtained and placed in the chart, a copy was provided to the patient. She will simulate on 11/05/23, and start treatment on 11/12/23.    In a visit lasting *** minutes, greater than 50% of the time was spent face to face discussing the patient's condition, in preparation for the discussion, and coordinating the patient's care.   The above documentation reflects my direct findings during this shared patient visit. Please see the separate note by Dr. Dewey on this date for the remainder of the patient's plan of care.    Donald KYM Husband, [facility]   **Disclaimer: This note was dictated with voice recognition software. Similar sounding words can inadvertently be transcribed and this note may contain transcription errors which may not have been corrected upon publication of note.**

## 2023-10-29 NOTE — Progress Notes (Signed)
 PATIENT NAVIGATOR PROGRESS NOTE  Name: Erin [name] Date: 10/29/2023 MRN: [id number]  DOB: 02/20/1950   Reason for visit:  New patient appt  Comments:  Met with Erin [name] and her son Erin [name] during appt with Dr Sherrill/ Referral placed for PICC line placement for 9/26 Referral for SW Referral Nutrition Referral to Dr Debby radish Will have PET scan on Friday Given written information on Mitomycin/Fluorouracil to begin on 9/29 Given Journeys notebook with disease specific information Given contact number to call with any questions or issues    Time spent counseling/coordinating care: > 60 minutes

## 2023-10-30 ENCOUNTER — Encounter: Payer: Self-pay | Admitting: Radiation Oncology

## 2023-10-30 ENCOUNTER — Other Ambulatory Visit: Payer: Self-pay

## 2023-10-30 ENCOUNTER — Ambulatory Visit
Admission: RE | Admit: 2023-10-30 | Discharge: 2023-10-30 | Disposition: A | Discharge status: Home / Self Care | Source: Ambulatory Visit | Attending: Radiation Oncology | Admitting: Radiation Oncology

## 2023-10-30 VITALS — BP 147/79 | HR 83 | Temp 97.8°F | Resp 20 | Ht 68.0 in | Wt 224.8 lb

## 2023-10-30 DIAGNOSIS — F17210 Nicotine dependence, cigarettes, uncomplicated: Secondary | ICD-10-CM | POA: Diagnosis not present

## 2023-10-30 DIAGNOSIS — C210 Malignant neoplasm of anus, unspecified: Secondary | ICD-10-CM | POA: Insufficient documentation

## 2023-10-30 DIAGNOSIS — R918 Other nonspecific abnormal finding of lung field: Secondary | ICD-10-CM | POA: Insufficient documentation

## 2023-10-30 DIAGNOSIS — Z79899 Other long term (current) drug therapy: Secondary | ICD-10-CM | POA: Insufficient documentation

## 2023-10-30 DIAGNOSIS — F1020 Alcohol dependence, uncomplicated: Secondary | ICD-10-CM | POA: Diagnosis not present

## 2023-10-30 NOTE — Progress Notes (Signed)
 The mammogram is normal. Please repeat yearly.

## 2023-10-30 NOTE — Progress Notes (Signed)
 Pharmacist Chemotherapy Monitoring - Initial Assessment    Anticipated start date: 11/12/23   The following has been reviewed per standard work regarding the patient's treatment regimen: The patient's diagnosis, treatment plan and drug doses, and organ/hematologic function Lab orders and baseline tests specific to treatment regimen  The treatment plan start date, drug sequencing, and pre-medications Prior authorization status  Patient's documented medication list, including drug-drug interaction screen and prescriptions for anti-emetics and supportive care specific to the treatment regimen The drug concentrations, fluid compatibility, administration routes, and timing of the medications to be used The patient's access for treatment and lifetime cumulative dose history, if applicable  The patient's medication allergies and previous infusion related reactions, if applicable   Changes made to treatment plan:  N/A  Follow up needed:  Pending authorization for treatment    Adriel Desrosier Kreul, [facility], 10/30/2023  2:46 PM

## 2023-11-01 ENCOUNTER — Other Ambulatory Visit: Payer: Self-pay

## 2023-11-01 ENCOUNTER — Inpatient Hospital Stay

## 2023-11-01 ENCOUNTER — Ambulatory Visit: Attending: Radiation Oncology | Admitting: Physical Therapy

## 2023-11-01 ENCOUNTER — Encounter: Payer: Self-pay | Admitting: Physical Therapy

## 2023-11-01 DIAGNOSIS — M6281 Muscle weakness (generalized): Secondary | ICD-10-CM | POA: Diagnosis present

## 2023-11-01 DIAGNOSIS — C210 Malignant neoplasm of anus, unspecified: Secondary | ICD-10-CM | POA: Diagnosis not present

## 2023-11-01 DIAGNOSIS — R293 Abnormal posture: Secondary | ICD-10-CM | POA: Insufficient documentation

## 2023-11-01 NOTE — Therapy (Signed)
 OUTPATIENT PHYSICAL THERAPY FEMALE PELVIC EVALUATION   Patient Name: Erin [name] MRN: [id number] DOB:26-May-1950, 73 y.o., female Today's Date: 11/01/2023  END OF SESSION:  PT End of Session - 11/01/23 1608     Visit Number 1    Date for Recertification  01/31/24    Authorization Type Medicare/Aetna  Aetna 2025(secondary)  No Auth Required    PT Start Time 1532    PT Stop Time 1607    PT Time Calculation (min) 35 min    Activity Tolerance Patient tolerated treatment well    Behavior During Therapy [facility] for tasks assessed/performed          Past Medical History:  Diagnosis Date   Alcohol abuse 11/08/2016   Alcoholism (HCC)    Depression    Hyperlipemia    Past Surgical History:  Procedure Laterality Date   NOSE SURGERY     Patient Active Problem List   Diagnosis Date Noted   Anal cancer (HCC) 10/29/2023   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/26/2021   Tobacco abuse 12/26/2021   Community acquired pneumonia 12/26/2021   QT prolongation 12/21/2021   Hypertension 12/21/2021   Acute lung injury 12/20/2021   Multifocal pneumonia 12/18/2021   Sepsis due to pneumonia (HCC) 12/17/2021   Depression 12/17/2021   Hypokalemia 12/17/2021   Acute respiratory failure with hypoxia (HCC) 12/17/2021   Essential hypertension 12/17/2021   Prolonged QT interval 12/17/2021    PCP: [name], PA-C  REFERRING PROVIDER: Lanell Donald Stagger, PA-C  REFERRING DIAG: C21.0 (ICD-10-CM) - Anal cancer (HCC)  THERAPY DIAG:  Abnormal posture  Muscle weakness (generalized)  Rationale for Evaluation and Treatment: Rehabilitation  ONSET DATE: 09/27/2023  SUBJECTIVE:                                                                                                                                                                                           SUBJECTIVE STATEMENT: Patient reports that she has no symptoms. She had a colonoscopy and they saw anal  cancer, she has never had PT before.  She is awaiting radiation starting 9/29.   Fluid intake: water  - tries 4 glasses/ day  FUNCTIONAL LIMITATIONS: no  PERTINENT HISTORY:  Medications for current condition: no Surgeries: no Other: no Sexual abuse: No  DIAGNOSTIC FINDINGS:  Post-void residual: Voiding Cystourethrogram (VCUG):  Ultrasound: PAIN:  Are you having pain? No  PRECAUTIONS: Other: anal cancer  RED FLAGS: None   WEIGHT BEARING RESTRICTIONS: No  FALLS:  Has patient fallen in last 6 months? No  OCCUPATION: works part time at [employer] as a Conservation officer, nature  ACTIVITY LEVEL : fairly active,  walks  PLOF: Independent  PATIENT GOALS: is not sure   BOWEL MOVEMENT: no issues URINATION: no issues  INTERCOURSE: not active   PREGNANCY: Vaginal deliveries 2 Tearing No Episiotomy No C-section deliveries no Currently pregnant No  PROLAPSE: None   OBJECTIVE:  Note: Objective measures were completed at Evaluation unless otherwise noted.  DIAGNOSTIC FINDINGS:  Colonoscopy 2025  PATIENT SURVEYS:    PFIQ-7: 5  COGNITION: Overall cognitive status: Within functional limits for tasks assessed     SENSATION: Light touch: Appears intact  LUMBAR SPECIAL TESTS:  Single leg stance test: Positive for decreased balance  FUNCTIONAL TESTS:   Single leg stance: decreased balance   Sit-up test:1/2 Squat: decreased balance Bed mobility: within functional limitations   GAIT: Assistive device utilized: None  POSTURE: rounded shoulders, forward head, and increased lumbar lordosis   LUMBARAROM/PROM:   A/PROM A/PROM  Eval (% available)  Flexion 75  Extension 75  Right lateral flexion 75  Left lateral flexion 75  Right rotation 75  Left rotation 75   (Blank rows = not tested)  LOWER EXTREMITY MNF:fpoi restrictions throughout hips   LOWER EXTREMITY MMT:  MMT Right eval Left eval  Hip flexion 4- 4  Hip extension    Hip abduction    Hip adduction     Hip internal rotation    Hip external rotation    Knee flexion    Knee extension    Ankle dorsiflexion    Ankle plantarflexion    Ankle inversion    Ankle eversion     (Blank rows = not tested) PALPATION:  General: good tone EAS  Pelvic Alignment: even  Abdominal: no TPs  Diastasis: No Distortion: No  Breathing: upper chest Scar tissue: No                External Perineal Exam: mild dryness present                             Internal Pelvic Floor: good tone EAS, able to contract, relax, bulge  Patient confirms identification and approves PT to assess internal pelvic floor and treatment Yes  PELVIC MMT:   MMT eval  Vaginal   Internal Anal Sphincter 5/5  External Anal Sphincter 5/5  Puborectalis   Diastasis Recti no  (Blank rows = not tested)        TONE: average  PROLAPSE: no  TODAY'S TREATMENT:                                                                                                                              DATE: 11/01/2023  EVAL  Examination completed, findings reviewed, pt educated on POC, HEP, and female pelvic floor anatomy, reasoning with pelvic floor assessment internally with pt consent. Pt motivated to participate in PT and agreeable to attempt recommendations.     PATIENT EDUCATION/ there acts Education details: Pt was educated on relevant  anatomy, exam findings, home exercise program, plan of care, expectations of PT and dilator use  Person educated: Patient Education method: Explanation, Demonstration, Tactile cues, and Verbal cues Education comprehension: verbalized understanding, returned demonstration, verbal cues required, tactile cues required, and needs further education  HOME EXERCISE PROGRAM: To be developed when she returns post radiation  ASSESSMENT:  CLINICAL IMPRESSION: Patient is a 73 y.o. F who was seen today for physical therapy evaluation and treatment for anal cancer. Exam findings are notable for upper chest  breathing strategies, good abdominal tone and strength, pelvic floor muscle average tone and coordination. External soft tissues of pelvic floor appear mildly dry. Patient demonstrates fair trunk mobility, some bilateral hip stiffness, good tone and strength in abdominal muscles, reduced range or motion in trunk and no pain. Discussed findings with patient, educated patient on dilator use post radiation and HEP was initiated. Patient's quality of life has been affected, patient will benefit from physical therapy to address deficits, reduce scar tissue post dilation  for anal cancer and improve pain and quality of life.    OBJECTIVE IMPAIRMENTS: decreased balance, decreased coordination, decreased knowledge of condition, and decreased strength.   ACTIVITY LIMITATIONS: none yet  PARTICIPATION LIMITATIONS: none yet ( prior to treatment)  PERSONAL FACTORS: 1 comorbidity: cancer are also affecting patient's functional outcome.   REHAB POTENTIAL: Good  CLINICAL DECISION MAKING: Evolving/moderate complexity  EVALUATION COMPLEXITY: Moderate   GOALS: Goals reviewed with patient? Yes  SHORT TERM GOALS: Target date: 11/29/2023    Patient will be I with use of dilators to reduce scar tissue Baseline: Goal status: INITIAL  2.  Patient will be I with her initial HEP Baseline:  Goal status: INITIAL  3.  Patient will be able to sit, stand and walk without anal pain for at least 1 hour Baseline:  Goal status: INITIAL  4.  Patient will be educated on with healthy bowel PT recommendations Baseline:  Goal status: INITIAL   LONG TERM GOALS: Target date: 01/24/2024    Patient will be I with advanced HEP Baseline:  Goal status: INITIAL  2.  Patient will report no anal pain Baseline:  Goal status: INITIAL  3.  Patient will have bristol stool scale 3-4 stool Baseline:  Goal status: INITIAL  4.  Patient will report complete bowel emptying Baseline:  Goal status:  INITIAL    PLAN:  PT FREQUENCY: 1-2x/week  PT DURATION: 12 weeks  PLANNED INTERVENTIONS: 97110-Therapeutic exercises, 97530- Therapeutic activity, 97112- Neuromuscular re-education, 97535- Self Care, [id number]- Manual therapy, Patient/Family education, Taping, Joint mobilization, Joint manipulation, Spinal manipulation, Spinal mobilization, Scar mobilization, Cryotherapy, Moist heat, and Biofeedback  PLAN FOR NEXT SESSION: continue education on dilators, pelvic floor down training, global strengthening   Mina Carlisi, PT 11/01/2023, 4:20 PM

## 2023-11-01 NOTE — Progress Notes (Signed)
 [facility] Clinical Social Work  Initial Assessment   Erin [name] is a 73 y.o. year old female contacted by phone. Clinical Social Work was referred by nurse navigator for assessment of psychosocial needs.   SDOH (Social Determinants of Health) assessments performed: Yes SDOH Interventions    Flowsheet Row Office Visit from 10/29/2023 in [facility] - A Dept Of . [facility]  SDOH Interventions   Food Insecurity Interventions Intervention Not Indicated  Housing Interventions Intervention Not Indicated    SDOH Screenings   Food Insecurity: No Food Insecurity (11/01/2023)  Housing: Low Risk  (11/01/2023)  Transportation Needs: No Transportation Needs (11/01/2023)  Utilities: Not At Risk (11/01/2023)  Depression (PHQ2-9): Low Risk  (11/01/2023)  Financial Resource Strain: Low Risk  (07/16/2023)   Received from [facility]  Physical Activity: Sufficiently Active (07/16/2023)   Received from [facility]  Social Connections: Moderately Integrated (07/16/2023)   Received from [facility]  Stress: No Stress Concern Present (07/16/2023)   Received from [facility]  Tobacco Use: High Risk (10/30/2023)    PHQ 2/9:    11/01/2023   11:02 AM 10/29/2023    3:43 PM 06/01/2017    2:53 PM  Depression screen PHQ 2/9  Decreased Interest 0 0 0  Down, Depressed, Hopeless 0 0 0  PHQ - 2 Score 0 0 0     Distress Screen completed: No     No data to display            Family/Social Information:  Housing Arrangement: Erin [name] lives alone in [place] Family members/support persons in your life? She named her daughter who lives 10 minutes down the road as her primary support system. Per reports, she also has a son who lives in [place]  Transportation concerns: no, she will be transporting to her appointments by herself or her daughter if needed.   Employment: Working part time she works PT at a [employer].  Income source: Employment Financial concerns:  No Type of concern: None Food access concerns: no Religious or spiritual practice: Yes, she is of the [employer]. Advanced directives: No Services Currently in place:  Insurance, Transportation, Family, Income  Coping/ Adjustment to diagnosis: Patient understands treatment plan and what happens next? yes, she detailed her treatment plan as approx 5 weeks of radiation and a chemo pump for the 1st and last week of treatment.  Concerns about diagnosis and/or treatment: She expressed expected worries and concerns about the treatment and the diagnosis. Specifically, how will these treatments go and what should she expect. At present, she feels these thoughts are manageable, no reported distress.  Patient reported stressors: None  Patient enjoys She enjoys walking and reading books.  Current coping skills/ strengths: Ability for insight , Active sense of humor , Average or above average intelligence , Capable of independent living , Communication skills , Financial means , General fund of knowledge , Motivation for treatment/growth , Religious Affiliation , Special hobby/interest , and Supportive family/friends     SUMMARY: Current SDOH Barriers:  No SDOH barriers identified.   Clinical Social Work Clinical Goal(s):  No clinical social work goals at this time. No SDOH barriers identified. No levels of stress noted.   Interventions: Discussed common feeling and emotions when being diagnosed with cancer, and the importance of support during treatment Informed patient of the support team roles and support services at [facility], including clinical counseling and the GI Support Group (placed patient on list as requested) Provided CSW  contact information and encouraged patient to call with any questions or concerns   Follow Up Plan: Patient will contact CSW with any support or resource needs   Erin Sprague, LCSW Clinical Social Worker [facility]

## 2023-11-02 ENCOUNTER — Ambulatory Visit (HOSPITAL_COMMUNITY)
Admission: RE | Admit: 2023-11-02 | Discharge: 2023-11-02 | Disposition: A | Discharge status: Home / Self Care | Source: Ambulatory Visit | Attending: Oncology | Admitting: Oncology

## 2023-11-02 DIAGNOSIS — C210 Malignant neoplasm of anus, unspecified: Secondary | ICD-10-CM | POA: Diagnosis present

## 2023-11-02 LAB — GLUCOSE, CAPILLARY: Glucose-Capillary: 96 mg/dL (ref 70–99)

## 2023-11-02 MED ORDER — FLUDEOXYGLUCOSE F - 18 (FDG) INJECTION: 11.5000 | Freq: Once | INTRAVENOUS | Status: DC | PRN

## 2023-11-02 MED ORDER — FLUDEOXYGLUCOSE F - 18 (FDG) INJECTION
11.1300 | Freq: Once | INTRAVENOUS | Status: AC
  Administered 2023-11-02: 11.13 via INTRAVENOUS

## 2023-11-05 ENCOUNTER — Telehealth: Payer: Self-pay | Admitting: Oncology

## 2023-11-05 ENCOUNTER — Ambulatory Visit
Admission: RE | Admit: 2023-11-05 | Discharge: 2023-11-05 | Disposition: A | Discharge status: Home / Self Care | Source: Ambulatory Visit | Attending: Radiation Oncology | Admitting: Radiation Oncology

## 2023-11-05 ENCOUNTER — Ambulatory Visit: Payer: Self-pay | Admitting: Oncology

## 2023-11-05 ENCOUNTER — Inpatient Hospital Stay

## 2023-11-05 DIAGNOSIS — C210 Malignant neoplasm of anus, unspecified: Secondary | ICD-10-CM | POA: Diagnosis not present

## 2023-11-05 DIAGNOSIS — Z510 Encounter for antineoplastic radiation therapy: Secondary | ICD-10-CM | POA: Diagnosis present

## 2023-11-05 NOTE — Telephone Encounter (Signed)
 PT CALLED BACK WANTS FRIDAY INSTEAD.

## 2023-11-05 NOTE — Telephone Encounter (Signed)
 PT EDU appt rescheduled, day and time confirmed.

## 2023-11-06 ENCOUNTER — Encounter: Payer: Self-pay | Admitting: Oncology

## 2023-11-06 NOTE — Telephone Encounter (Signed)
 Notified of PE showing only anal mass and no metastatic disease. Plan on chemo and radiation.

## 2023-11-07 ENCOUNTER — Other Ambulatory Visit

## 2023-11-07 ENCOUNTER — Ambulatory Visit: Admitting: Radiation Oncology

## 2023-11-08 ENCOUNTER — Encounter: Payer: Self-pay | Admitting: Radiation Oncology

## 2023-11-09 ENCOUNTER — Ambulatory Visit (HOSPITAL_COMMUNITY)
Admission: RE | Admit: 2023-11-09 | Discharge: 2023-11-09 | Disposition: A | Discharge status: Home / Self Care | Source: Ambulatory Visit | Attending: Oncology | Admitting: Oncology

## 2023-11-09 ENCOUNTER — Ambulatory Visit

## 2023-11-09 DIAGNOSIS — C210 Malignant neoplasm of anus, unspecified: Secondary | ICD-10-CM | POA: Insufficient documentation

## 2023-11-09 MED ORDER — HEPARIN SOD (PORK) LOCK FLUSH 100 UNIT/ML IV SOLN
INTRAVENOUS | Status: AC
  Filled 2023-11-09: qty 5

## 2023-11-09 MED ORDER — LIDOCAINE HCL 1 % IJ SOLN
INTRAMUSCULAR | Status: AC
  Filled 2023-11-09: qty 20

## 2023-11-09 MED ORDER — HEPARIN SOD (PORK) LOCK FLUSH 100 UNIT/ML IV SOLN
500.0000 [IU] | Freq: Once | INTRAVENOUS | Status: AC
  Administered 2023-11-09: 500 [IU] via INTRAVENOUS

## 2023-11-09 MED ORDER — LIDOCAINE HCL 1 % IJ SOLN
20.0000 mL | Freq: Once | INTRAMUSCULAR | Status: AC
  Administered 2023-11-09: 4 mL via INTRADERMAL

## 2023-11-09 NOTE — Procedures (Signed)
 Successful placement of DL lumen PICC line to basilic vein. Length 49cm Tip at lower SVC/RA PICC capped No complications Ready for use.  EBL < 5 mL   Cordella DELENA Banner PA-C 11/09/2023 3:47 PM  \

## 2023-11-11 ENCOUNTER — Other Ambulatory Visit: Payer: Self-pay | Admitting: Oncology

## 2023-11-12 ENCOUNTER — Inpatient Hospital Stay

## 2023-11-12 ENCOUNTER — Other Ambulatory Visit

## 2023-11-12 ENCOUNTER — Inpatient Hospital Stay: Admitting: Nurse Practitioner

## 2023-11-12 ENCOUNTER — Encounter: Payer: Self-pay | Admitting: Nurse Practitioner

## 2023-11-12 ENCOUNTER — Other Ambulatory Visit: Payer: Self-pay | Admitting: Oncology

## 2023-11-12 ENCOUNTER — Inpatient Hospital Stay: Admitting: Nutrition

## 2023-11-12 ENCOUNTER — Other Ambulatory Visit: Payer: Self-pay

## 2023-11-12 ENCOUNTER — Ambulatory Visit
Admission: RE | Admit: 2023-11-12 | Discharge: 2023-11-12 | Disposition: A | Discharge status: Home / Self Care | Source: Ambulatory Visit | Attending: Radiation Oncology | Admitting: Radiation Oncology

## 2023-11-12 VITALS — BP 137/80 | HR 77 | Temp 97.8°F | Resp 18

## 2023-11-12 VITALS — BP 131/81 | HR 78 | Temp 97.2°F | Wt 224.9 lb

## 2023-11-12 DIAGNOSIS — C210 Malignant neoplasm of anus, unspecified: Secondary | ICD-10-CM

## 2023-11-12 DIAGNOSIS — Z510 Encounter for antineoplastic radiation therapy: Secondary | ICD-10-CM | POA: Diagnosis not present

## 2023-11-12 LAB — CBC WITH DIFFERENTIAL (CANCER CENTER ONLY)
Abs Immature Granulocytes: 0.01 K/uL (ref 0.00–0.07)
Basophils Absolute: 0 K/uL (ref 0.0–0.1)
Basophils Relative: 0 %
Eosinophils Absolute: 0.2 K/uL (ref 0.0–0.5)
Eosinophils Relative: 3 %
HCT: 37.8 % (ref 36.0–46.0)
Hemoglobin: 12.3 g/dL (ref 12.0–15.0)
Immature Granulocytes: 0 %
Lymphocytes Relative: 22 %
Lymphs Abs: 1.4 K/uL (ref 0.7–4.0)
MCH: 27.7 pg (ref 26.0–34.0)
MCHC: 32.5 g/dL (ref 30.0–36.0)
MCV: 85.1 fL (ref 80.0–100.0)
Monocytes Absolute: 0.7 K/uL (ref 0.1–1.0)
Monocytes Relative: 10 %
Neutro Abs: 4.2 K/uL (ref 1.7–7.7)
Neutrophils Relative %: 65 %
Platelet Count: 219 K/uL (ref 150–400)
RBC: 4.44 MIL/uL (ref 3.87–5.11)
RDW: 16.8 % — ABNORMAL HIGH (ref 11.5–15.5)
WBC Count: 6.5 K/uL (ref 4.0–10.5)
nRBC: 0 % (ref 0.0–0.2)

## 2023-11-12 LAB — CMP (CANCER CENTER ONLY)
ALT: 30 U/L (ref 0–44)
AST: 22 U/L (ref 15–41)
Albumin: 3.7 g/dL (ref 3.5–5.0)
Alkaline Phosphatase: 112 U/L (ref 38–126)
Anion gap: 11 (ref 5–15)
BUN: 12 mg/dL (ref 8–23)
CO2: 22 mmol/L (ref 22–32)
Calcium: 9.4 mg/dL (ref 8.9–10.3)
Chloride: 104 mmol/L (ref 98–111)
Creatinine: 0.74 mg/dL (ref 0.44–1.00)
GFR, Estimated: >60 mL/min (ref >60)
Glucose, Bld: 152 mg/dL — ABNORMAL HIGH (ref 70–99)
Potassium: 4.3 mmol/L (ref 3.5–5.1)
Sodium: 137 mmol/L (ref 135–145)
Total Bilirubin: 0.3 mg/dL (ref 0.0–1.2)
Total Protein: 6.3 g/dL — ABNORMAL LOW (ref 6.5–8.1)

## 2023-11-12 LAB — RAD ONC ARIA SESSION SUMMARY
Course Elapsed Days: 0
Plan Fractions Treated to Date: 1
Plan Prescribed Dose Per Fraction: 1.8 Gy
Plan Total Fractions Prescribed: 28
Plan Total Prescribed Dose: 50.4 Gy
Reference Point Dosage Given to Date: 1.8 Gy
Reference Point Session Dosage Given: 1.8 Gy
Session Number: 1

## 2023-11-12 MED ORDER — MITOMYCIN CHEMO IV INJECTION 20 MG
9.0000 mg/m2 | Freq: Once | INTRAVENOUS | Status: AC
  Administered 2023-11-12: 20 mg via INTRAVENOUS
  Filled 2023-11-12: qty 40

## 2023-11-12 MED ORDER — SODIUM CHLORIDE 0.9 % IV SOLN: INTRAVENOUS | Status: DC

## 2023-11-12 MED ORDER — PROCHLORPERAZINE MALEATE 10 MG PO TABS
10.0000 mg | ORAL_TABLET | Freq: Once | ORAL | Status: AC
  Administered 2023-11-12: 10 mg via ORAL
  Filled 2023-11-12: qty 1

## 2023-11-12 MED ORDER — SODIUM CHLORIDE 0.9 % IV SOLN
1000.0000 mg/m2/d | INTRAVENOUS | Status: DC
  Administered 2023-11-12: 8500 mg via INTRAVENOUS
  Filled 2023-11-12: qty 170

## 2023-11-12 NOTE — Progress Notes (Signed)
 Patient seen by Olam Ned NP today  Vitals are within treatment parameters:Yes   Labs are within treatment parameters: Yes   Treatment plan has been signed: Yes   Per physician team, Patient is ready for treatment and there are NO modifications to the treatment plan.

## 2023-11-12 NOTE — Progress Notes (Signed)
   [facility]cer Center OFFICE PROGRESS NOTE   Diagnosis: Anal cancer  INTERVAL HISTORY:   [name] returns as scheduled.  She had a PICC line placed 11/09/2023.  Bowels moving regularly.  No blood or pain with bowel movements.  No nausea.  She has a good appetite.  Objective:  Vital signs in last 24 hours:  Blood pressure 131/81, pulse 78, temperature (!) 97.2 F (36.2 C), temperature source Temporal, weight 224 lb 14.4 oz (102 kg), SpO2 99%.    HEENT: No thrush or ulcers. Resp: Lungs clear bilaterally. Cardio: Regular rate and rhythm. GI: No hepatosplenomegaly.  Nontender. Vascular: No leg edema. Neuro: Alert and oriented. Left upper extremity PICC without erythema.  Lab Results:  Lab Results  Component Value Date   WBC 6.5 11/12/2023   HGB 12.3 11/12/2023   HCT 37.8 11/12/2023   MCV 85.1 11/12/2023   PLT 219 11/12/2023   NEUTROABS 4.2 11/12/2023    Imaging:  No results found.  Medications: I have reviewed the patient's current medications.  Assessment/Plan: Anal cancer, stage IIa (cT2, cN0) 09/27/2023 colonoscopy: Mass at the dentate line, 3.5 cm, biopsy-moderately differentiated squamous cell carcinoma, p16 positive, Ki-67 increased 08/20/2023-CT chest lung cancer screening-unchanged 4 mm subpleural nodule right middle lobe, no new nodules 10/08/2023-CT abdomen/pelvis-no evidence of metastatic disease, right hepatic cyst, shotty retroperitoneal and pelvic adenopathy-no pathologic adenopathy PET scan 11/02/2023-hypermetabolic soft tissue prominence at the anorectal junction.  No evidence of metastatic disease. Radiation 11/12/2023  Cycle 1 5-FU/Mitomycin-C 11/12/2023 G2, P2 Basal cell carcinoma of the nose Hyperlipidemia Anxiety/depression Alcoholism Tobacco use  Disposition: Erin [name] appears stable.  She is seen prior to beginning cycle one 5-FU/Mitomycin-C.  We again reviewed potential toxicities.  She agrees to proceed.  She will begin  radiation later today.  We reviewed the PET scan results/images with her and her daughter at today's visit.  They understand there is no evidence of metastatic disease.  CBC and chemistry panel reviewed.  Labs adequate for treatment.  She will return for lab and follow-up 11/23/2023.  We are available to see her sooner if needed.  Patient seen with Dr. Cloretta.  Erin [name] ANP/GNP-BC   11/12/2023  11:49 AM   This was a shared visit with Erin [name].  Erin [name] has been diagnosed with localized anal cancer.  We reviewed the PET findings and images with her.  The plan is to proceed with concurrent 5-FU/Mitomycin-C and radiation today.  We reviewed potential toxicities associated with 5-FU and mitomycin.  She agrees to proceed.  I was present for greater than 50% of today's visit.  I performed medical decision making.  Erin Cloretta, MD

## 2023-11-12 NOTE — Patient Instructions (Signed)
 [facility] - A DEPT OF [place]  Discharge Instructions: Thank you for choosing [facility] to provide your oncology and hematology care.   If you have a lab appointment with the Cancer Center, please go directly to the Cancer Center and check in at the registration area.   Wear comfortable clothing and clothing appropriate for easy access to any Portacath or PICC line.   We strive to give you quality time with your provider. You may need to reschedule your appointment if you arrive late (15 or more minutes).  Arriving late affects you and other patients whose appointments are after yours.  Also, if you miss three or more appointments without notifying the office, you may be dismissed from the clinic at the provider's discretion.      For prescription refill requests, have your pharmacy contact our office and allow 72 hours for refills to be completed.    Today you received the following chemotherapy and/or immunotherapy agents: mutamycin, fluorouracil   Fluorouracil Injection What is this medication? FLUOROURACIL (flure oh YOOR a sil) treats some types of cancer. It works by slowing down the growth of cancer cells. This medicine may be used for other purposes; ask your health care provider or pharmacist if you have questions. COMMON BRAND NAME(S): Adrucil What should I tell my care team before I take this medication? They need to know if you have any of these conditions: Blood disorders Dihydropyrimidine dehydrogenase (DPD) deficiency Infection, such as chickenpox, cold sores, herpes Kidney disease Liver disease Poor nutrition Recent or ongoing radiation therapy An unusual or allergic reaction to fluorouracil, other medications, foods, dyes, or preservatives If you or your partner are pregnant or trying to get pregnant Breast-feeding How should I use this medication? This medication is injected into a vein. It is administered by your  care team in a hospital or clinic setting. Talk to your care team about the use of this medication in children. Special care may be needed. Overdosage: If you think you have taken too much of this medicine contact a poison control center or emergency room at once. NOTE: This medicine is only for you. Do not share this medicine with others. What if I miss a dose? Keep appointments for follow-up doses. It is important not to miss your dose. Call your care team if you are unable to keep an appointment. What may interact with this medication? Do not take this medication with any of the following: Live virus vaccines This medication may also interact with the following: Medications that treat or prevent blood clots, such as warfarin, enoxaparin , dalteparin This list may not describe all possible interactions. Give your health care provider a list of all the medicines, herbs, non-prescription drugs, or dietary supplements you use. Also tell them if you smoke, drink alcohol, or use illegal drugs. Some items may interact with your medicine. What should I watch for while using this medication? Your condition will be monitored carefully while you are receiving this medication. This medication may make you feel generally unwell. This is not uncommon as chemotherapy can affect healthy cells as well as cancer cells. Report any side effects. Continue your course of treatment even though you feel ill unless your care team tells you to stop. In some cases, you may be given additional medications to help with side effects. Follow all directions for their use. This medication may increase your risk of getting an infection. Call your care team for advice  if you get a fever, chills, sore throat, or other symptoms of a cold or flu. Do not treat yourself. Try to avoid being around people who are sick. This medication may increase your risk to bruise or bleed. Call your care team if you notice any unusual bleeding. Be  careful brushing or flossing your teeth or using a toothpick because you may get an infection or bleed more easily. If you have any dental work done, tell your dentist you are receiving this medication. Avoid taking medications that contain aspirin, acetaminophen , ibuprofen, naproxen, or ketoprofen unless instructed by your care team. These medications may hide a fever. Do not treat diarrhea with over the counter products. Contact your care team if you have diarrhea that lasts more than 2 days or if it is severe and watery. This medication can make you more sensitive to the sun. Keep out of the sun. If you cannot avoid being in the sun, wear protective clothing and sunscreen. Do not use sun lamps, tanning beds, or tanning booths. Talk to your care team if you or your partner wish to become pregnant or think you might be pregnant. This medication can cause serious birth defects if taken during pregnancy and for 3 months after the last dose. A reliable form of contraception is recommended while taking this medication and for 3 months after the last dose. Talk to your care team about effective forms of contraception. Do not father a child while taking this medication and for 3 months after the last dose. Use a condom while having sex during this time period. Do not breastfeed while taking this medication. This medication may cause infertility. Talk to your care team if you are concerned about your fertility. What side effects may I notice from receiving this medication? Side effects that you should report to your care team as soon as possible: Allergic reactions--skin rash, itching, hives, swelling of the face, lips, tongue, or throat Heart attack--pain or tightness in the chest, shoulders, arms, or jaw, nausea, shortness of breath, cold or clammy skin, feeling faint or lightheaded Heart failure--shortness of breath, swelling of the ankles, feet, or hands, sudden weight gain, unusual weakness or  fatigue Heart rhythm changes--fast or irregular heartbeat, dizziness, feeling faint or lightheaded, chest pain, trouble breathing High ammonia level--unusual weakness or fatigue, confusion, loss of appetite, nausea, vomiting, seizures Infection--fever, chills, cough, sore throat, wounds that don't heal, pain or trouble when passing urine, general feeling of discomfort or being unwell Low red blood cell level--unusual weakness or fatigue, dizziness, headache, trouble breathing Pain, tingling, or numbness in the hands or feet, muscle weakness, change in vision, confusion or trouble speaking, loss of balance or coordination, trouble walking, seizures Redness, swelling, and blistering of the skin over hands and feet Severe or prolonged diarrhea Unusual bruising or bleeding Side effects that usually do not require medical attention (report to your care team if they continue or are bothersome): Dry skin Headache Increased tears Nausea Pain, redness, or swelling with sores inside the mouth or throat Sensitivity to light Vomiting This list may not describe all possible side effects. Call your doctor for medical advice about side effects. You may report side effects to FDA at 1-800-FDA-1088. Where should I keep my medication? This medication is given in a hospital or clinic. It will not be stored at home. NOTE: This sheet is a summary. It may not cover all possible information. If you have questions about this medicine, talk to your doctor, pharmacist, or health care  provider.  2024 Elsevier/Gold Standard (2021-06-07 00:00:00)  Mitomycin Injection What is this medication? MITOMYCIN (mye toe MYE sin) treats stomach cancer and pancreatic cancer. It works by slowing down the growth of cancer cells. This medicine may be used for other purposes; ask your health care provider or pharmacist if you have questions. COMMON BRAND NAME(S): Mutamycin What should I tell my care team before I take this  medication? They need to know if you have any of these conditions: Bleeding disorders Infection, such as chickenpox, cold sores, herpes Low blood counts, such as low white cells, platelets, red blood cells Kidney disease An unusual or allergic reaction to mitomycin, other medications, foods, dyes, or preservatives Pregnant or trying to get pregnant Breastfeeding How should I use this medication? This medication is injected into a vein. It is given by your care team in a hospital or clinic setting. Talk to your care team about the use of this medication in children. Special care may be needed. Overdosage: If you think you have taken too much of this medicine contact a poison control center or emergency room at once. NOTE: This medicine is only for you. Do not share this medicine with others. What if I miss a dose? Keep appointments for follow-up doses. It is important not to miss your dose. Call your care team if you are unable to keep an appointment. What may interact with this medication? Interactions are not expected. This list may not describe all possible interactions. Give your health care provider a list of all the medicines, herbs, non-prescription drugs, or dietary supplements you use. Also tell them if you smoke, drink alcohol, or use illegal drugs. Some items may interact with your medicine. What should I watch for while using this medication? Your condition will be monitored carefully while you are receiving this medication. You may need blood work while taking this medication. This medication may make you feel generally unwell. This is not uncommon as chemotherapy can affect healthy cells as well as cancer cells. Report any side effects. Continue your course of treatment even though you feel ill unless your care team tells you to stop. This medication may increase your risk of getting an infection. Call your care team for advice if you get a fever, chills, sore throat, or other  symptoms of a cold or flu. Do not treat yourself. Try to avoid being around people who are sick. Avoid taking medications that contain aspirin, acetaminophen , ibuprofen, naproxen, or ketoprofen unless instructed by your care team. These medications may hide a fever. This medication may increase your risk to bruise or bleed. Call your care team if you notice any unusual bleeding. Be careful brushing or flossing your teeth or using a toothpick because you may get an infection or bleed more easily. If you have any dental work done, tell your dentist you are receiving this medication. Talk to your care team if you may be pregnant. Serious birth defects can occur if you take this medication during pregnancy. Contraception is recommended while taking this medication. Your care team can help you find the option that works for you. Do not breastfeed while taking this medication. What side effects may I notice from receiving this medication? Side effects that you should report to your care team as soon as possible: Allergic reactions--skin rash, itching, hives, swelling of the face, lips, tongue, or throat Dry cough, shortness of breath or trouble breathing Infection--fever, chills, cough, sore throat, wounds that don't heal, pain or trouble when passing  urine, general feeling of discomfort or being unwell Kidney injury--decrease in the amount of urine, swelling of the ankles, hands, or feet Low red blood cell level--unusual weakness or fatigue, dizziness, headache, trouble breathing Stomach pain, bloody diarrhea, pale skin, unusual weakness or fatigue, decrease in the amount of urine, which may be signs of hemolytic uremic syndrome Unusual bruising or bleeding Side effects that usually do not require medical attention (report these to your care team if they continue or are bothersome): Diarrhea Hair loss Loss of appetite with weight loss Nausea Pain, redness, or swelling with sores inside the mouth or  throat This list may not describe all possible side effects. Call your doctor for medical advice about side effects. You may report side effects to FDA at 1-800-FDA-1088. Where should I keep my medication? This medication is given in a hospital or clinic. It will not be stored at home. NOTE: This sheet is a summary. It may not cover all possible information. If you have questions about this medicine, talk to your doctor, pharmacist, or health care provider.  2024 Elsevier/Gold Standard (2021-06-23 00:00:00)      To help prevent nausea and vomiting after your treatment, we encourage you to take your nausea medication as directed.  BELOW ARE SYMPTOMS THAT SHOULD BE REPORTED IMMEDIATELY: *FEVER GREATER THAN 100.4 F (38 C) OR HIGHER *CHILLS OR SWEATING *NAUSEA AND VOMITING THAT IS NOT CONTROLLED WITH YOUR NAUSEA MEDICATION *UNUSUAL SHORTNESS OF BREATH *UNUSUAL BRUISING OR BLEEDING *URINARY PROBLEMS (pain or burning when urinating, or frequent urination) *BOWEL PROBLEMS (unusual diarrhea, constipation, pain near the anus) TENDERNESS IN MOUTH AND THROAT WITH OR WITHOUT PRESENCE OF ULCERS (sore throat, sores in mouth, or a toothache) UNUSUAL RASH, SWELLING OR PAIN  UNUSUAL VAGINAL DISCHARGE OR ITCHING   Items with * indicate a potential emergency and should be followed up as soon as possible or go to the Emergency Department if any problems should occur.  Please show the CHEMOTHERAPY ALERT CARD or IMMUNOTHERAPY ALERT CARD at check-in to the Emergency Department and triage nurse.  Should you have questions after your visit or need to cancel or reschedule your appointment, please contact [facility]  Dept: [phone]  and follow the prompts.  Office hours are 8:00 a.m. to 4:30 p.m. Monday - Friday. Please note that voicemails left after 4:00 p.m. may not be returned until the following business day.  We are closed weekends and major  holidays. You have access to a nurse at all times for urgent questions. Please call the main number to the clinic Dept: [phone] and follow the prompts.   For any non-urgent questions, you may also contact your provider using MyChart. We now offer e-Visits for anyone 38 and older to request care online for non-urgent symptoms. For details visit [email or web address].   Also download the MyChart app! Go to the app store, search MyChart, open the app, select [facility], and log in with your MyChart username and password.

## 2023-11-12 NOTE — Progress Notes (Signed)
 73 year old female diagnosed with Anal Cancer and followed by Dr. Cloretta and Dr.Moody. Plan is for concurrent chemoradiation treatments. (Mitomycin and 5 FU)  PMH includes Alcohol abuse, Depression and HLD.  Medications include Wellbutrin , Prozac , Protonix  and Compazine .  Labs include:  Height: 5' 8. Weight: 224 pounds 14.4 oz. Sept 29 BMI: 34.20  Patient denies current nutrition impact symptoms. She usually eats smaller meals and wonders what is an example of a small meal/snack. Currently making protein smoothies.  Nutrition Diagnosis: Food and Nutrition Related Knowledge Deficit related to anal cancer and associated treatments as evidenced by no prior need for nutrition related information.  Intervention: Educated on importance of small frequent meals and snacks with adequate calories and protein to support weight maintenance. Provided examples of small meals/snacks. Reviewed strategies for treatment related diarrhea. Brief review of low fiber diet. Provided nutrition fact sheets. Questions answered. Contact information given.  Monitoring, Evaluation, Goals: Tolerate adequate calories and protein for maintenance of lean body mass and healing.  Next Visit: Monday, October 27, during infusion.

## 2023-11-13 ENCOUNTER — Other Ambulatory Visit: Payer: Self-pay

## 2023-11-13 ENCOUNTER — Ambulatory Visit
Admission: RE | Admit: 2023-11-13 | Discharge: 2023-11-13 | Disposition: A | Discharge status: Home / Self Care | Source: Ambulatory Visit | Attending: Radiation Oncology | Admitting: Radiation Oncology

## 2023-11-13 DIAGNOSIS — Z510 Encounter for antineoplastic radiation therapy: Secondary | ICD-10-CM | POA: Diagnosis not present

## 2023-11-13 LAB — RAD ONC ARIA SESSION SUMMARY
Course Elapsed Days: 1
Plan Fractions Treated to Date: 2
Plan Prescribed Dose Per Fraction: 1.8 Gy
Plan Total Fractions Prescribed: 28
Plan Total Prescribed Dose: 50.4 Gy
Reference Point Dosage Given to Date: 3.6 Gy
Reference Point Session Dosage Given: 1.8 Gy
Session Number: 2

## 2023-11-14 ENCOUNTER — Other Ambulatory Visit: Payer: Self-pay

## 2023-11-14 ENCOUNTER — Ambulatory Visit
Admission: RE | Admit: 2023-11-14 | Discharge: 2023-11-14 | Disposition: A | Discharge status: Home / Self Care | Source: Ambulatory Visit | Attending: Radiation Oncology | Admitting: Radiation Oncology

## 2023-11-14 DIAGNOSIS — Z5111 Encounter for antineoplastic chemotherapy: Secondary | ICD-10-CM | POA: Diagnosis not present

## 2023-11-14 DIAGNOSIS — Z85828 Personal history of other malignant neoplasm of skin: Secondary | ICD-10-CM | POA: Diagnosis not present

## 2023-11-14 DIAGNOSIS — C210 Malignant neoplasm of anus, unspecified: Secondary | ICD-10-CM | POA: Insufficient documentation

## 2023-11-14 DIAGNOSIS — Z510 Encounter for antineoplastic radiation therapy: Secondary | ICD-10-CM | POA: Insufficient documentation

## 2023-11-14 DIAGNOSIS — Z452 Encounter for adjustment and management of vascular access device: Secondary | ICD-10-CM | POA: Insufficient documentation

## 2023-11-14 LAB — RAD ONC ARIA SESSION SUMMARY
Course Elapsed Days: 2
Plan Fractions Treated to Date: 3
Plan Prescribed Dose Per Fraction: 1.8 Gy
Plan Total Fractions Prescribed: 28
Plan Total Prescribed Dose: 50.4 Gy
Reference Point Dosage Given to Date: 5.4 Gy
Reference Point Session Dosage Given: 1.8 Gy
Session Number: 3

## 2023-11-15 ENCOUNTER — Other Ambulatory Visit: Payer: Self-pay

## 2023-11-15 ENCOUNTER — Ambulatory Visit
Admission: RE | Admit: 2023-11-15 | Discharge: 2023-11-15 | Disposition: A | Discharge status: Home / Self Care | Source: Ambulatory Visit | Attending: Radiation Oncology | Admitting: Radiation Oncology

## 2023-11-15 DIAGNOSIS — Z510 Encounter for antineoplastic radiation therapy: Secondary | ICD-10-CM | POA: Diagnosis not present

## 2023-11-15 LAB — RAD ONC ARIA SESSION SUMMARY
Course Elapsed Days: 3
Plan Fractions Treated to Date: 4
Plan Prescribed Dose Per Fraction: 1.8 Gy
Plan Total Fractions Prescribed: 28
Plan Total Prescribed Dose: 50.4 Gy
Reference Point Dosage Given to Date: 7.2 Gy
Reference Point Session Dosage Given: 1.8 Gy
Session Number: 4

## 2023-11-16 ENCOUNTER — Telehealth: Payer: Self-pay | Admitting: *Deleted

## 2023-11-16 ENCOUNTER — Other Ambulatory Visit: Payer: Self-pay | Admitting: Nurse Practitioner

## 2023-11-16 ENCOUNTER — Other Ambulatory Visit (HOSPITAL_BASED_OUTPATIENT_CLINIC_OR_DEPARTMENT_OTHER): Payer: Self-pay

## 2023-11-16 ENCOUNTER — Inpatient Hospital Stay

## 2023-11-16 ENCOUNTER — Ambulatory Visit
Admission: RE | Admit: 2023-11-16 | Discharge: 2023-11-16 | Disposition: A | Discharge status: Home / Self Care | Source: Ambulatory Visit | Attending: Radiation Oncology | Admitting: Radiation Oncology

## 2023-11-16 ENCOUNTER — Encounter: Payer: Self-pay | Admitting: Oncology

## 2023-11-16 ENCOUNTER — Other Ambulatory Visit: Payer: Self-pay

## 2023-11-16 DIAGNOSIS — Z85828 Personal history of other malignant neoplasm of skin: Secondary | ICD-10-CM | POA: Insufficient documentation

## 2023-11-16 DIAGNOSIS — C210 Malignant neoplasm of anus, unspecified: Secondary | ICD-10-CM | POA: Insufficient documentation

## 2023-11-16 DIAGNOSIS — F1020 Alcohol dependence, uncomplicated: Secondary | ICD-10-CM | POA: Insufficient documentation

## 2023-11-16 DIAGNOSIS — Z5111 Encounter for antineoplastic chemotherapy: Secondary | ICD-10-CM | POA: Insufficient documentation

## 2023-11-16 DIAGNOSIS — E785 Hyperlipidemia, unspecified: Secondary | ICD-10-CM | POA: Insufficient documentation

## 2023-11-16 DIAGNOSIS — D709 Neutropenia, unspecified: Secondary | ICD-10-CM | POA: Insufficient documentation

## 2023-11-16 DIAGNOSIS — R911 Solitary pulmonary nodule: Secondary | ICD-10-CM | POA: Insufficient documentation

## 2023-11-16 DIAGNOSIS — F418 Other specified anxiety disorders: Secondary | ICD-10-CM | POA: Insufficient documentation

## 2023-11-16 DIAGNOSIS — Z510 Encounter for antineoplastic radiation therapy: Secondary | ICD-10-CM | POA: Diagnosis not present

## 2023-11-16 LAB — RAD ONC ARIA SESSION SUMMARY
Course Elapsed Days: 4
Plan Fractions Treated to Date: 5
Plan Prescribed Dose Per Fraction: 1.8 Gy
Plan Total Fractions Prescribed: 28
Plan Total Prescribed Dose: 50.4 Gy
Reference Point Dosage Given to Date: 9 Gy
Reference Point Session Dosage Given: 1.8 Gy
Session Number: 5

## 2023-11-16 MED ORDER — NYSTATIN 100000 UNIT/ML MT SUSP
5.0000 mL | Freq: Four times a day (QID) | OROMUCOSAL | 1 refills | Status: DC | PRN
  Filled 2023-11-16: qty 240, 12d supply, fill #0
  Filled 2023-12-20: qty 240, 12d supply, fill #1

## 2023-11-16 MED ORDER — MAGIC MOUTHWASH: 5.0000 mL | Freq: Four times a day (QID) | ORAL | 1 refills | Status: DC | PRN

## 2023-11-16 NOTE — Progress Notes (Signed)
 PICC Removal Note: S: Pt presents for pump d/c and PICC line removal (order in Epic from Olam Ned, NP for removal). O: PICC line removed from left antecubital after sterile site prepped per protocol. Pt held her breath and bore down as catheter was pulled.  PICC catheter tip visualized and intact (correct length of PICC line removed- 49cm). Pressure dressing applied with Medipore tape (Vaseline gauze on pt's skin covered with sterile gauze then Medipore applied firmly on top). A: No redness, ecchymosis, edema, swelling, or drainage noted at site. P: Instructions provided on post PICC discharge care, including followup notification instructions.

## 2023-11-16 NOTE — Telephone Encounter (Signed)
 Patient called to report that her mouth burns and is sore and that she has felt some tingling in her scalp. Informed her that MMW will be called to [facility] pharmacy to pick up today while here for her pump d/c.

## 2023-11-16 NOTE — Patient Instructions (Signed)
 PICC Removal, Adult, Care After The following information offers guidance on how to care for yourself after your procedure. Your health care provider may also give you more specific instructions. If you have problems or questions, contact your health care provider. What can I expect after the procedure? After the procedure, it is common to have: Tenderness or soreness. Redness, swelling, or a scab at the place where your PICC was removed (exit site). Follow these instructions at home: For the first 24 hours after the procedure: Keep the bandage (dressing) on your exit site clean and dry. Do not remove your dressing until your health care provider tells you to do so. Do not lift anything heavy or do activities that require great effort until your health care provider says it is okay. You should avoid: Lifting weights. Doing yard work. Doing any physical activity with repetitive arm movement. Watch closely for any signs of an air bubble in the vein (air embolism). This is a rare but serious complication. Signs of an air embolism include trouble breathing, wheezing, chest pain, or a fast pulse. If you have signs of an air embolism, call 911 right away and lie down on your left side to keep the air from moving into your lungs. After 24 hours have passed:  Remove your dressing as told by your health care provider. Wash your hands with soap and water for at least 20 seconds before and after you change your dressing. If soap and water are not available, use hand sanitizer. Return to your normal activities as told by your health care provider. A small scab may develop over the exit site. Do not pick at the scab. When bathing or showering, gently wash the exit site with soap and water. Pat it dry. Watch for signs of infection, such as: A fever or chills. Swollen glands under your arm. More redness, swelling, or soreness around your arm. Blood, fluid, or pus coming from your exit site. Warmth or a  bad smell coming from your exit site. A red streak spreading away from your exit site. General instructions Take over-the-counter and prescription medicines only as told by your health care provider. Do not take any new medicines without checking with your health care provider first. If you were given an antibiotic ointment, apply it as told by your health care provider. Keep all follow-up visits. This is important. Contact a health care provider if: You have a fever or chills. You have swelling at your exit site or swollen glands under your arm. You have signs of infection at your exit site. You have soreness, redness, or swelling in your arm that gets worse. Get help right away if: You have numbness or tingling in your fingers, hand, or arm. Your arm looks blue and feels cold. You have signs of an air embolism, such as trouble breathing, wheezing, chest pain, or a fast pulse. These symptoms may be an emergency. Get medical help right away. Call 911. Do not wait to see if the symptoms will go away. Do not drive yourself to the hospital. Summary After a PICC is removed, it is common to have tenderness or soreness, redness, swelling, or a scab at the exit site. Keep the bandage (dressing) over the exit site clean and dry. Do not remove the dressing until your health care provider tells you to do so. Do not lift anything heavy or do activities that require great effort until your health care provider says it is okay. Watch closely for any signs  of an air bubble (air embolism). If you have signs of an air embolism, call 911 right away and lie down on your left side. This information is not intended to replace advice given to you by your health care provider. Make sure you discuss any questions you have with your health care provider. Document Revised: 08/18/2020 Document Reviewed: 08/18/2020 Elsevier Patient Education  2024 [employer].

## 2023-11-19 ENCOUNTER — Telehealth: Payer: Self-pay | Admitting: *Deleted

## 2023-11-19 ENCOUNTER — Ambulatory Visit
Admission: RE | Admit: 2023-11-19 | Discharge: 2023-11-19 | Disposition: A | Source: Ambulatory Visit | Attending: Radiation Oncology

## 2023-11-19 ENCOUNTER — Other Ambulatory Visit: Payer: Self-pay

## 2023-11-19 DIAGNOSIS — Z510 Encounter for antineoplastic radiation therapy: Secondary | ICD-10-CM | POA: Diagnosis not present

## 2023-11-19 LAB — RAD ONC ARIA SESSION SUMMARY
Course Elapsed Days: 7
Plan Fractions Treated to Date: 6
Plan Prescribed Dose Per Fraction: 1.8 Gy
Plan Total Fractions Prescribed: 28
Plan Total Prescribed Dose: 50.4 Gy
Reference Point Dosage Given to Date: 10.8 Gy
Reference Point Session Dosage Given: 1.8 Gy
Session Number: 6

## 2023-11-19 NOTE — Telephone Encounter (Signed)
 Called to report that her mouth sores may be slightly improved today. She feels as if her entire oral mucosa is swollen. Encouraged her to continue MMW and swish for 1 full minute before she spits. In between she can suck on ice chips or popsicle. Call if she is not getting better or has trouble swallowing.

## 2023-11-20 ENCOUNTER — Ambulatory Visit
Admission: RE | Admit: 2023-11-20 | Discharge: 2023-11-20 | Disposition: A | Discharge status: Home / Self Care | Source: Ambulatory Visit | Attending: Radiation Oncology | Admitting: Radiation Oncology

## 2023-11-20 ENCOUNTER — Other Ambulatory Visit: Payer: Self-pay

## 2023-11-20 DIAGNOSIS — Z510 Encounter for antineoplastic radiation therapy: Secondary | ICD-10-CM | POA: Diagnosis not present

## 2023-11-20 LAB — RAD ONC ARIA SESSION SUMMARY
Course Elapsed Days: 8
Plan Fractions Treated to Date: 7
Plan Prescribed Dose Per Fraction: 1.8 Gy
Plan Total Fractions Prescribed: 28
Plan Total Prescribed Dose: 50.4 Gy
Reference Point Dosage Given to Date: 12.6 Gy
Reference Point Session Dosage Given: 1.8 Gy
Session Number: 7

## 2023-11-21 ENCOUNTER — Ambulatory Visit
Admission: RE | Admit: 2023-11-21 | Discharge: 2023-11-21 | Disposition: A | Discharge status: Home / Self Care | Source: Ambulatory Visit | Attending: Radiation Oncology | Admitting: Radiation Oncology

## 2023-11-21 ENCOUNTER — Other Ambulatory Visit: Payer: Self-pay

## 2023-11-21 DIAGNOSIS — Z510 Encounter for antineoplastic radiation therapy: Secondary | ICD-10-CM | POA: Diagnosis not present

## 2023-11-21 LAB — RAD ONC ARIA SESSION SUMMARY
Course Elapsed Days: 9
Plan Fractions Treated to Date: 8
Plan Prescribed Dose Per Fraction: 1.8 Gy
Plan Total Fractions Prescribed: 28
Plan Total Prescribed Dose: 50.4 Gy
Reference Point Dosage Given to Date: 14.4 Gy
Reference Point Session Dosage Given: 1.8 Gy
Session Number: 8

## 2023-11-22 ENCOUNTER — Ambulatory Visit
Admission: RE | Admit: 2023-11-22 | Discharge: 2023-11-22 | Disposition: A | Source: Ambulatory Visit | Attending: Radiation Oncology

## 2023-11-22 ENCOUNTER — Other Ambulatory Visit: Payer: Self-pay

## 2023-11-22 DIAGNOSIS — Z510 Encounter for antineoplastic radiation therapy: Secondary | ICD-10-CM | POA: Diagnosis not present

## 2023-11-22 LAB — RAD ONC ARIA SESSION SUMMARY
Course Elapsed Days: 10
Plan Fractions Treated to Date: 9
Plan Prescribed Dose Per Fraction: 1.8 Gy
Plan Total Fractions Prescribed: 28
Plan Total Prescribed Dose: 50.4 Gy
Reference Point Dosage Given to Date: 16.2 Gy
Reference Point Session Dosage Given: 1.8 Gy
Session Number: 9

## 2023-11-23 ENCOUNTER — Ambulatory Visit
Admission: RE | Admit: 2023-11-23 | Discharge: 2023-11-23 | Disposition: A | Discharge status: Home / Self Care | Source: Ambulatory Visit | Attending: Radiation Oncology | Admitting: Radiation Oncology

## 2023-11-23 ENCOUNTER — Inpatient Hospital Stay

## 2023-11-23 ENCOUNTER — Inpatient Hospital Stay (HOSPITAL_BASED_OUTPATIENT_CLINIC_OR_DEPARTMENT_OTHER): Admitting: Nurse Practitioner

## 2023-11-23 ENCOUNTER — Encounter: Payer: Self-pay | Admitting: Nurse Practitioner

## 2023-11-23 ENCOUNTER — Other Ambulatory Visit: Payer: Self-pay | Admitting: *Deleted

## 2023-11-23 ENCOUNTER — Other Ambulatory Visit (HOSPITAL_BASED_OUTPATIENT_CLINIC_OR_DEPARTMENT_OTHER): Payer: Self-pay

## 2023-11-23 ENCOUNTER — Other Ambulatory Visit: Payer: Self-pay

## 2023-11-23 VITALS — BP 139/74 | HR 89 | Temp 97.8°F | Resp 18 | Ht 68.0 in | Wt 229.6 lb

## 2023-11-23 DIAGNOSIS — Z510 Encounter for antineoplastic radiation therapy: Secondary | ICD-10-CM | POA: Diagnosis not present

## 2023-11-23 DIAGNOSIS — C210 Malignant neoplasm of anus, unspecified: Secondary | ICD-10-CM | POA: Diagnosis not present

## 2023-11-23 LAB — RAD ONC ARIA SESSION SUMMARY
Course Elapsed Days: 11
Plan Fractions Treated to Date: 10
Plan Prescribed Dose Per Fraction: 1.8 Gy
Plan Total Fractions Prescribed: 28
Plan Total Prescribed Dose: 50.4 Gy
Reference Point Dosage Given to Date: 18 Gy
Reference Point Session Dosage Given: 1.8 Gy
Session Number: 10

## 2023-11-23 LAB — CMP (CANCER CENTER ONLY)
ALT: 39 U/L (ref 0–44)
AST: 30 U/L (ref 15–41)
Albumin: 3.7 g/dL (ref 3.5–5.0)
Alkaline Phosphatase: 99 U/L (ref 38–126)
Anion gap: 9 (ref 5–15)
BUN: 9 mg/dL (ref 8–23)
CO2: 25 mmol/L (ref 22–32)
Calcium: 8.8 mg/dL — ABNORMAL LOW (ref 8.9–10.3)
Chloride: 103 mmol/L (ref 98–111)
Creatinine: 0.64 mg/dL (ref 0.44–1.00)
GFR, Estimated: >60 mL/min (ref >60)
Glucose, Bld: 109 mg/dL — ABNORMAL HIGH (ref 70–99)
Potassium: 4 mmol/L (ref 3.5–5.1)
Sodium: 137 mmol/L (ref 135–145)
Total Bilirubin: 0.2 mg/dL (ref 0.0–1.2)
Total Protein: 5.9 g/dL — ABNORMAL LOW (ref 6.5–8.1)

## 2023-11-23 LAB — CBC WITH DIFFERENTIAL (CANCER CENTER ONLY)
Abs Immature Granulocytes: 0.01 K/uL (ref 0.00–0.07)
Basophils Absolute: 0 K/uL (ref 0.0–0.1)
Basophils Relative: 1 %
Eosinophils Absolute: 0.1 K/uL (ref 0.0–0.5)
Eosinophils Relative: 5 %
HCT: 32.8 % — ABNORMAL LOW (ref 36.0–46.0)
Hemoglobin: 10.9 g/dL — ABNORMAL LOW (ref 12.0–15.0)
Immature Granulocytes: 1 %
Lymphocytes Relative: 26 %
Lymphs Abs: 0.5 K/uL — ABNORMAL LOW (ref 0.7–4.0)
MCH: 27.9 pg (ref 26.0–34.0)
MCHC: 33.2 g/dL (ref 30.0–36.0)
MCV: 84.1 fL (ref 80.0–100.0)
Monocytes Absolute: 0.3 K/uL (ref 0.1–1.0)
Monocytes Relative: 16 %
Neutro Abs: 0.9 K/uL — ABNORMAL LOW (ref 1.7–7.7)
Neutrophils Relative %: 51 %
Platelet Count: 164 K/uL (ref 150–400)
RBC: 3.9 MIL/uL (ref 3.87–5.11)
RDW: 15.5 % (ref 11.5–15.5)
WBC Count: 1.8 K/uL — ABNORMAL LOW (ref 4.0–10.5)
nRBC: 0 % (ref 0.0–0.2)

## 2023-11-23 NOTE — Progress Notes (Signed)
  [facility] OFFICE PROGRESS NOTE   Diagnosis: Anal cancer  INTERVAL HISTORY:   Ms. Harbour returns as scheduled.  She continues radiation.  She completed cycle one 5-FU/Mitomycin-C beginning 11/12/2023.  She denies nausea/vomiting.  Mouth felt inflamed and she had a sore throat.  No discrete ulcers.  The discomfort is improving.  She was able to eat and drink without difficulty.  She has intermittent loose stools.  She takes Imodium as needed.  No rash.  No hand or foot pain or redness.  Appetite overall is good.  She feels fluid intake is adequate.  No fever.  No bleeding.  Objective:  Vital signs in last 24 hours:  Blood pressure 139/74, pulse 89, temperature 97.8 F (36.6 C), temperature source Temporal, resp. rate 18, height 5' 8 (1.727 m), weight 229 lb 9.6 oz (104.1 kg), SpO2 100%.    HEENT: No thrush or ulcers.  Mucous membranes appear moist. Resp: Lungs clear bilaterally. Cardio: Regular rate and rhythm. GI: No hepatosplenomegaly.  Nontender.  Perianal skin is erythematous with superficial skin breakdown. Vascular: No leg edema. Skin: Palms without erythema.   Lab Results:  Lab Results  Component Value Date   WBC 1.8 (L) 11/23/2023   HGB 10.9 (L) 11/23/2023   HCT 32.8 (L) 11/23/2023   MCV 84.1 11/23/2023   PLT 164 11/23/2023   NEUTROABS 0.9 (L) 11/23/2023    Imaging:  No results found.  Medications: I have reviewed the patient's current medications.  Assessment/Plan: Anal cancer, stage IIa (cT2, cN0) 09/27/2023 colonoscopy: Mass at the dentate line, 3.5 cm, biopsy-moderately differentiated squamous cell carcinoma, p16 positive, Ki-67 increased 08/20/2023-CT chest lung cancer screening-unchanged 4 mm subpleural nodule right middle lobe, no new nodules 10/08/2023-CT abdomen/pelvis-no evidence of metastatic disease, right hepatic cyst, shotty retroperitoneal and pelvic adenopathy-no pathologic adenopathy PET scan 11/02/2023-hypermetabolic soft  tissue prominence at the anorectal junction.  No evidence of metastatic disease. Radiation 11/12/2023  Cycle 1 5-FU/Mitomycin-C 11/12/2023 G2, P2 Basal cell carcinoma of the nose Hyperlipidemia Anxiety/depression Alcoholism Tobacco use    Disposition: Ms. Krauss appears stable.  She continues radiation.  She has completed 1 cycle of 5-FU/Mitomycin-C.  Overall tolerated well.  She had symptoms suggestive of oral mucositis.  No mouth ulcerations noted on exam today.  Refill for Magic mouthwash sent to the pharmacy.  CBC and chemistry panel reviewed.  She has mild neutropenia.  Precautions reviewed.  She understands to contact the office with fever, chills, other signs of infection.  Plan for repeat CBC mid next week.  She will return for follow-up and cycle two 5-FU/Mitomycin-C on 12/10/2023.  We are available to see her sooner if needed.    Olam Ned ANP/GNP-BC   11/23/2023  2:45 PM

## 2023-11-26 ENCOUNTER — Inpatient Hospital Stay (HOSPITAL_BASED_OUTPATIENT_CLINIC_OR_DEPARTMENT_OTHER)
Admission: EM | Admit: 2023-11-26 | Discharge: 2023-11-28 | DRG: 809 | Disposition: A | Discharge status: Home / Self Care | Attending: Internal Medicine | Admitting: Internal Medicine

## 2023-11-26 ENCOUNTER — Emergency Department (HOSPITAL_BASED_OUTPATIENT_CLINIC_OR_DEPARTMENT_OTHER)

## 2023-11-26 ENCOUNTER — Other Ambulatory Visit: Payer: Self-pay

## 2023-11-26 ENCOUNTER — Telehealth: Payer: Self-pay

## 2023-11-26 ENCOUNTER — Telehealth: Payer: Self-pay | Admitting: *Deleted

## 2023-11-26 ENCOUNTER — Ambulatory Visit
Admission: RE | Admit: 2023-11-26 | Discharge: 2023-11-26 | Disposition: A | Discharge status: Home / Self Care | Source: Ambulatory Visit | Attending: Radiation Oncology | Admitting: Radiation Oncology

## 2023-11-26 ENCOUNTER — Emergency Department (HOSPITAL_BASED_OUTPATIENT_CLINIC_OR_DEPARTMENT_OTHER): Admitting: Radiology

## 2023-11-26 DIAGNOSIS — C210 Malignant neoplasm of anus, unspecified: Secondary | ICD-10-CM | POA: Diagnosis present

## 2023-11-26 DIAGNOSIS — D709 Neutropenia, unspecified: Secondary | ICD-10-CM | POA: Diagnosis not present

## 2023-11-26 DIAGNOSIS — F17210 Nicotine dependence, cigarettes, uncomplicated: Secondary | ICD-10-CM | POA: Diagnosis present

## 2023-11-26 DIAGNOSIS — D696 Thrombocytopenia, unspecified: Secondary | ICD-10-CM | POA: Diagnosis present

## 2023-11-26 DIAGNOSIS — D701 Agranulocytosis secondary to cancer chemotherapy: Principal | ICD-10-CM | POA: Diagnosis present

## 2023-11-26 DIAGNOSIS — Z85828 Personal history of other malignant neoplasm of skin: Secondary | ICD-10-CM

## 2023-11-26 DIAGNOSIS — Z9221 Personal history of antineoplastic chemotherapy: Secondary | ICD-10-CM

## 2023-11-26 DIAGNOSIS — Z7983 Long term (current) use of bisphosphonates: Secondary | ICD-10-CM

## 2023-11-26 DIAGNOSIS — D630 Anemia in neoplastic disease: Secondary | ICD-10-CM | POA: Diagnosis present

## 2023-11-26 DIAGNOSIS — Z79899 Other long term (current) drug therapy: Secondary | ICD-10-CM

## 2023-11-26 DIAGNOSIS — Z923 Personal history of irradiation: Secondary | ICD-10-CM

## 2023-11-26 DIAGNOSIS — E785 Hyperlipidemia, unspecified: Secondary | ICD-10-CM | POA: Diagnosis present

## 2023-11-26 DIAGNOSIS — Z8249 Family history of ischemic heart disease and other diseases of the circulatory system: Secondary | ICD-10-CM

## 2023-11-26 DIAGNOSIS — R5081 Fever presenting with conditions classified elsewhere: Secondary | ICD-10-CM | POA: Diagnosis present

## 2023-11-26 DIAGNOSIS — Z888 Allergy status to other drugs, medicaments and biological substances status: Secondary | ICD-10-CM

## 2023-11-26 DIAGNOSIS — R197 Diarrhea, unspecified: Secondary | ICD-10-CM | POA: Diagnosis present

## 2023-11-26 DIAGNOSIS — F32A Depression, unspecified: Secondary | ICD-10-CM | POA: Diagnosis present

## 2023-11-26 LAB — CBC WITH DIFFERENTIAL/PLATELET
Abs Immature Granulocytes: 0.01 K/uL (ref 0.00–0.07)
Basophils Absolute: 0 K/uL (ref 0.0–0.1)
Basophils Relative: 0 %
Eosinophils Absolute: 0 K/uL (ref 0.0–0.5)
Eosinophils Relative: 3 %
HCT: 31.7 % — ABNORMAL LOW (ref 36.0–46.0)
Hemoglobin: 10.6 g/dL — ABNORMAL LOW (ref 12.0–15.0)
Immature Granulocytes: 1 %
Lymphocytes Relative: 36 %
Lymphs Abs: 0.5 K/uL — ABNORMAL LOW (ref 0.7–4.0)
MCH: 28 pg (ref 26.0–34.0)
MCHC: 33.4 g/dL (ref 30.0–36.0)
MCV: 83.6 fL (ref 80.0–100.0)
Monocytes Absolute: 0.5 K/uL (ref 0.1–1.0)
Monocytes Relative: 34 %
Neutro Abs: 0.4 K/uL — CL (ref 1.7–7.7)
Neutrophils Relative %: 26 %
Platelets: 114 K/uL — ABNORMAL LOW (ref 150–400)
RBC: 3.79 MIL/uL — ABNORMAL LOW (ref 3.87–5.11)
RDW: 16.2 % — ABNORMAL HIGH (ref 11.5–15.5)
WBC: 1.4 K/uL — CL (ref 4.0–10.5)
nRBC: 0 % (ref 0.0–0.2)

## 2023-11-26 LAB — COMPREHENSIVE METABOLIC PANEL WITH GFR
ALT: 29 U/L (ref 0–44)
AST: 17 U/L (ref 15–41)
Albumin: 3.9 g/dL (ref 3.5–5.0)
Alkaline Phosphatase: 94 U/L (ref 38–126)
Anion gap: 11 (ref 5–15)
BUN: 9 mg/dL (ref 8–23)
CO2: 24 mmol/L (ref 22–32)
Calcium: 9.5 mg/dL (ref 8.9–10.3)
Chloride: 102 mmol/L (ref 98–111)
Creatinine, Ser: 0.68 mg/dL (ref 0.44–1.00)
GFR, Estimated: >60 mL/min (ref >60)
Glucose, Bld: 104 mg/dL — ABNORMAL HIGH (ref 70–99)
Potassium: 4.2 mmol/L (ref 3.5–5.1)
Sodium: 136 mmol/L (ref 135–145)
Total Bilirubin: 0.3 mg/dL (ref 0.0–1.2)
Total Protein: 6.3 g/dL — ABNORMAL LOW (ref 6.5–8.1)

## 2023-11-26 LAB — RAD ONC ARIA SESSION SUMMARY
Course Elapsed Days: 14
Plan Fractions Treated to Date: 11
Plan Prescribed Dose Per Fraction: 1.8 Gy
Plan Total Fractions Prescribed: 28
Plan Total Prescribed Dose: 50.4 Gy
Reference Point Dosage Given to Date: 19.8 Gy
Reference Point Session Dosage Given: 1.8 Gy
Session Number: 11

## 2023-11-26 LAB — LACTIC ACID, PLASMA: Lactic Acid, Venous: 0.4 mmol/L — ABNORMAL LOW (ref 0.5–1.9)

## 2023-11-26 LAB — URINALYSIS, W/ REFLEX TO CULTURE (INFECTION SUSPECTED)
Bacteria, UA: NONE SEEN
Bilirubin Urine: NEGATIVE
Glucose, UA: NEGATIVE mg/dL
Hgb urine dipstick: NEGATIVE
Ketones, ur: NEGATIVE mg/dL
Leukocytes,Ua: NEGATIVE
Nitrite: NEGATIVE
Protein, ur: NEGATIVE mg/dL
Specific Gravity, Urine: 1.009 (ref 1.005–1.030)
pH: 6 (ref 5.0–8.0)

## 2023-11-26 LAB — PROTIME-INR
INR: 0.9 (ref 0.8–1.2)
Prothrombin Time: 12.6 s (ref 11.4–15.2)

## 2023-11-26 MED ORDER — METRONIDAZOLE 500 MG/100ML IV SOLN
500.0000 mg | Freq: Once | INTRAVENOUS | Status: AC
  Administered 2023-11-26: 500 mg via INTRAVENOUS
  Filled 2023-11-26: qty 100

## 2023-11-26 MED ORDER — LACTATED RINGERS IV BOLUS
1000.0000 mL | Freq: Once | INTRAVENOUS | Status: AC
  Administered 2023-11-26: 1000 mL via INTRAVENOUS

## 2023-11-26 MED ORDER — LACTATED RINGERS IV SOLN: INTRAVENOUS | Status: AC

## 2023-11-26 MED ORDER — SODIUM CHLORIDE 0.9 % IV SOLN
2.0000 g | Freq: Three times a day (TID) | INTRAVENOUS | Status: DC
  Administered 2023-11-26 – 2023-11-28 (×5): 2 g via INTRAVENOUS
  Filled 2023-11-26 (×5): qty 12.5

## 2023-11-26 MED ORDER — IOHEXOL 300 MG/ML  SOLN
100.0000 mL | Freq: Once | INTRAMUSCULAR | Status: AC | PRN
  Administered 2023-11-26: 100 mL via INTRAVENOUS

## 2023-11-26 MED ORDER — ACETAMINOPHEN 500 MG PO TABS
1000.0000 mg | ORAL_TABLET | Freq: Once | ORAL | Status: AC
  Administered 2023-11-26: 1000 mg via ORAL
  Filled 2023-11-26: qty 2

## 2023-11-26 MED ORDER — DIPHENOXYLATE-ATROPINE 2.5-0.025 MG PO TABS: 1.0000 | ORAL_TABLET | Freq: Four times a day (QID) | ORAL | 0 refills | Status: DC | PRN

## 2023-11-26 NOTE — ED Triage Notes (Signed)
 Reports fever and diarrhea starting today. Currently getting treatment for Anal/colorectal cancer. Active chemo. Week 3.

## 2023-11-26 NOTE — Telephone Encounter (Signed)
 Called pt in regards to her FMLA form being completed,faxed, and confirmation received. Pt Stated that she would like to pick her forms up from the [facility]. No questions or concerns to be noted.

## 2023-11-26 NOTE — Telephone Encounter (Signed)
 Patient called to report started having diarrhea last night and asking what to take instead of Imodium? She reports #4 liquid stools today and has only taken #4 Imodium today. Informed her she can take #8 Imodium/day and to push fluids. Call early tomorrow if not significant improvement. Will also send in script for Lomotil and she was instructed to also tell RT tomorrow when she is there.

## 2023-11-26 NOTE — ED Provider Notes (Signed)
 [place] EMERGENCY DEPARTMENT AT [facility] Provider Note   CSN: [id number] Arrival date & time: 11/26/23  1753     Patient presents with: Fever   Erin [name] is a 73 y.o. female.   Patient is a 73 year old female with a history of hyperlipidemia, depression, rectal cancer who is currently undergoing chemotherapy and radiation who is presenting today with a fever.  Patient reports that she finished a 5-day course of chemotherapy last week and is currently undergoing radiation.  Since she started radiation she has had diarrhea but has not had any since earlier today.  She was doing her normal things she would do today but noticed when she got home this afternoon she felt hot and flushed.  She took her temperature and it was elevated and they recommend she come to the emergency room.  Patient denies any abdominal pain, nausea, vomiting, cough, shortness of breath.  No leg pain or swelling.  She is having no issues urinating.  She does report she has not had much of an appetite and has not been eating or drinking as much but otherwise has no other complaints.  The history is provided by the patient.  Fever      Prior to Admission medications   Medication Sig Start Date End Date Taking? Authorizing Provider  alum & mag hydroxide-simeth-diphenhydrAMINE-nystatin Swish and spit 5 mLs 4 (four) times daily as needed. 11/16/23     atorvastatin  (LIPITOR) 20 MG tablet Take 20 mg by mouth every evening. 02/25/20   [provider]  buPROPion  (WELLBUTRIN  XL) 150 MG 24 hr tablet Take 150 mg by mouth every morning. 03/20/20   [provider]  cloNIDine  (CATAPRES ) 0.1 MG tablet Take 0.1 mg by mouth at bedtime. 03/20/20   [provider]  diphenoxylate-atropine (LOMOTIL) 2.5-0.025 MG tablet Take 1-2 tablets by mouth 4 (four) times daily as needed for diarrhea or loose stools (maxiumu #8/day). 11/26/23   Cloretta Arley NOVAK, MD  FLUoxetine  (PROZAC ) 40 MG capsule  Take 40 mg by mouth daily. 09/15/21   [provider]  hydrOXYzine  (ATARAX /VISTARIL ) 25 MG tablet Take 25 mg by mouth daily. 03/04/20   [provider]  LATUDA  80 MG TABS tablet Take 40 mg by mouth at bedtime. 01/13/20   [provider]  loperamide (IMODIUM) 2 MG capsule Take 4 mg by mouth as needed for diarrhea or loose stools.    [provider]  magic mouthwash SOLN Take 5 mLs by mouth 4 (four) times daily as needed for mouth pain (swish and spit). 11/16/23   Cloretta Arley NOVAK, MD  nicotine  (NICODERM CQ  - DOSED IN MG/24 HOURS) 21 mg/24hr patch Place 1 patch (21 mg total) onto the skin daily. 12/28/21   Sebastian Toribio GAILS, MD  pantoprazole  (PROTONIX ) 40 MG tablet Take 1 tablet (40 mg total) by mouth daily at 6 (six) AM. 12/28/21   Sebastian Toribio GAILS, MD  prochlorperazine  (COMPAZINE ) 10 MG tablet Take 1 tablet (10 mg total) by mouth every 6 (six) hours as needed for nausea or vomiting. 10/29/23   Cloretta Arley NOVAK, MD    Allergies: Lamictal [lamotrigine]    Review of Systems  Constitutional:  Positive for fever.    Updated Vital Signs BP (!) 151/66   Pulse 93   Temp (!) 102.2 F (39 C) (Oral)   Resp (!) 24   SpO2 97%   Physical Exam Vitals and nursing note reviewed.  Constitutional:      General: She is not  in acute distress.    Appearance: She is well-developed.  HENT:     Head: Normocephalic and atraumatic.     Mouth/Throat:     Mouth: Mucous membranes are dry.  Eyes:     Pupils: Pupils are equal, round, and reactive to light.  Cardiovascular:     Rate and Rhythm: Regular rhythm. Tachycardia present.     Heart sounds: Normal heart sounds. No murmur heard.    No friction rub.  Pulmonary:     Effort: Pulmonary effort is normal.     Breath sounds: Normal breath sounds. No wheezing or rales.  Abdominal:     General: Bowel sounds are normal. There is no distension.     Palpations: Abdomen is soft.     Tenderness: There is no abdominal  tenderness. There is no right CVA tenderness, left CVA tenderness, guarding or rebound.  Musculoskeletal:        General: No tenderness. Normal range of motion.     Cervical back: Normal range of motion and neck supple.     Right lower leg: No edema.     Left lower leg: No edema.     Comments: No edema  Skin:    General: Skin is warm and dry.     Findings: No rash.  Neurological:     Mental Status: She is alert and oriented to person, place, and time.     Cranial Nerves: No cranial nerve deficit.  Psychiatric:        Behavior: Behavior normal.     (all labs ordered are listed, but only abnormal results are displayed) Labs Reviewed  COMPREHENSIVE METABOLIC PANEL WITH GFR - Abnormal; Notable for the following components:      Result Value   Glucose, Bld 104 (*)    Total Protein 6.3 (*)    All other components within normal limits  LACTIC ACID, PLASMA - Abnormal; Notable for the following components:   Lactic Acid, Venous 0.4 (*)    All other components within normal limits  CBC WITH DIFFERENTIAL/PLATELET - Abnormal; Notable for the following components:   WBC 1.4 (*)    RBC 3.79 (*)    Hemoglobin 10.6 (*)    HCT 31.7 (*)    RDW 16.2 (*)    Platelets 114 (*)    Neutro Abs 0.4 (*)    Lymphs Abs 0.5 (*)    All other components within normal limits  URINALYSIS, W/ REFLEX TO CULTURE (INFECTION SUSPECTED) - Abnormal; Notable for the following components:   Color, Urine COLORLESS (*)    All other components within normal limits  CULTURE, BLOOD (ROUTINE X 2)  CULTURE, BLOOD (ROUTINE X 2)  PROTIME-INR    EKG: None  Radiology: DG Chest 2 View if patient is not in a treatment room. Result Date: 11/26/2023 EXAM: 2 VIEW(S) XRAY OF THE CHEST 11/26/2023 06:30:00 PM COMPARISON: Comparison day chest x-ray 12/21/2021. CLINICAL HISTORY: Suspected Sepsis. Reports fever and diarrhea starting today. Currently getting treatment for Anal/colorectal cancer. Active chemo. Week 3. FINDINGS:  LUNGS AND PLEURA: No focal pulmonary opacity. No pulmonary edema. No pleural effusion. No pneumothorax. HEART AND MEDIASTINUM: No acute abnormality of the cardiac and mediastinal silhouettes. BONES AND SOFT TISSUES: No acute osseous abnormality. IMPRESSION: 1. No acute cardiopulmonary disease Electronically signed by: Greig Pique MD 11/26/2023 06:56 PM EDT RP Workstation: [id number]     Procedures   Medications Ordered in the ED  lactated ringers  infusion (has no administration in time range)  ceFEPIme (MAXIPIME)  2 g in sodium chloride  0.9 % 100 mL IVPB (has no administration in time range)  lactated ringers  bolus 1,000 mL (1,000 mLs Intravenous New Bag/Given 11/26/23 1919)  acetaminophen  (TYLENOL ) tablet 1,000 mg (1,000 mg Oral Given 11/26/23 1919)                                    Medical Decision Making Amount and/or Complexity of Data Reviewed External Data Reviewed: notes. Labs: ordered. Decision-making details documented in ED Course. Radiology: ordered and independent interpretation performed. Decision-making details documented in ED Course.  Risk OTC drugs. Prescription drug management.   Pt with multiple medical problems and comorbidities and presenting today with a complaint that caries a high risk for morbidity and mortality.  Here today with complaint of fever in the setting of currently receiving chemo and radiation.  Concern for neutropenic fever.  No findings to suggest UTI, cellulitis, pneumonia.  Sepsis labs were initiated.  Patient was given Tylenol  for her fever of 102.2.  Blood pressure remained stable at this time and patient was given fluids.  8:28 PM I independently interpreted patient's labs and CMP without acute findings, lactic acid is low at 0.4, CBC with neutropenia and leukopenia with a white count of 1.4 and an absolute neutrophil count of 400, INR within normal limits.  I have independently visualized and interpreted pt's images today. Chest x-ray  within normal limits.  Consulted oncology  pt given Cefepime.  Attempted ot reach out to Dr. Loretha by phone and left a message.  Discussed with the patient and will consult to the hospitalist for admission.     Final diagnoses:  Neutropenic fever    ED Discharge Orders     None          Doretha Folks, MD 11/26/23 2028

## 2023-11-26 NOTE — Plan of Care (Signed)
 Plan of Care Note for accepted transfer  Patient: Erin [name]    FMW:[id number]  DOA: 11/26/2023     Facility requesting transfer: [facility] ED  Requesting Provider: Doretha Folks, MD   Reason for transfer: Neutropenic fever  Facility course:   43 F with hx of Anal Ca St IIa, on 5FU, mitomycin, XRT, on active treatment. C1 9/29. Other Hx HLD, Mood d/o, etoh use d/o, smoking, BCC nose. Presented with fever. In ED F 102F. ANC 400. Cxr neg, UA neg. Treated with Cefepime. Edp messaged to oncology. Advised to get CT A/P with IV contrast - negative. Add Flagyl for abx. Is ordered for continued abx, mIVF 100 /hr, neutropenic diet   Plan of care: The patient is accepted for admission to Telemetry unit, at [facility].    Author: Dorn Dawson, MD  11/26/2023  Check [email or web address] for on-call coverage.  Nursing staff, Please call [facility] Admits & Consults System-Wide number on Amion as soon as patient's arrival, so appropriate admitting provider can evaluate the pt.

## 2023-11-27 ENCOUNTER — Encounter (HOSPITAL_COMMUNITY): Payer: Self-pay | Admitting: Internal Medicine

## 2023-11-27 ENCOUNTER — Ambulatory Visit

## 2023-11-27 DIAGNOSIS — Z8249 Family history of ischemic heart disease and other diseases of the circulatory system: Secondary | ICD-10-CM | POA: Diagnosis not present

## 2023-11-27 DIAGNOSIS — Z7983 Long term (current) use of bisphosphonates: Secondary | ICD-10-CM | POA: Diagnosis not present

## 2023-11-27 DIAGNOSIS — Z85828 Personal history of other malignant neoplasm of skin: Secondary | ICD-10-CM | POA: Diagnosis not present

## 2023-11-27 DIAGNOSIS — Z888 Allergy status to other drugs, medicaments and biological substances status: Secondary | ICD-10-CM | POA: Diagnosis not present

## 2023-11-27 DIAGNOSIS — D709 Neutropenia, unspecified: Principal | ICD-10-CM | POA: Diagnosis present

## 2023-11-27 DIAGNOSIS — D630 Anemia in neoplastic disease: Secondary | ICD-10-CM | POA: Diagnosis present

## 2023-11-27 DIAGNOSIS — E785 Hyperlipidemia, unspecified: Secondary | ICD-10-CM | POA: Diagnosis present

## 2023-11-27 DIAGNOSIS — Z923 Personal history of irradiation: Secondary | ICD-10-CM | POA: Diagnosis not present

## 2023-11-27 DIAGNOSIS — Z79899 Other long term (current) drug therapy: Secondary | ICD-10-CM | POA: Diagnosis not present

## 2023-11-27 DIAGNOSIS — D701 Agranulocytosis secondary to cancer chemotherapy: Secondary | ICD-10-CM | POA: Diagnosis present

## 2023-11-27 DIAGNOSIS — R5081 Fever presenting with conditions classified elsewhere: Secondary | ICD-10-CM | POA: Diagnosis present

## 2023-11-27 DIAGNOSIS — F32A Depression, unspecified: Secondary | ICD-10-CM | POA: Diagnosis present

## 2023-11-27 DIAGNOSIS — Z9221 Personal history of antineoplastic chemotherapy: Secondary | ICD-10-CM | POA: Diagnosis not present

## 2023-11-27 DIAGNOSIS — F17210 Nicotine dependence, cigarettes, uncomplicated: Secondary | ICD-10-CM | POA: Diagnosis present

## 2023-11-27 DIAGNOSIS — C210 Malignant neoplasm of anus, unspecified: Secondary | ICD-10-CM | POA: Diagnosis present

## 2023-11-27 DIAGNOSIS — R197 Diarrhea, unspecified: Secondary | ICD-10-CM | POA: Diagnosis present

## 2023-11-27 DIAGNOSIS — D696 Thrombocytopenia, unspecified: Secondary | ICD-10-CM | POA: Diagnosis present

## 2023-11-27 LAB — CBC
HCT: 31.3 % — ABNORMAL LOW (ref 36.0–46.0)
Hemoglobin: 9.9 g/dL — ABNORMAL LOW (ref 12.0–15.0)
MCH: 27.7 pg (ref 26.0–34.0)
MCHC: 31.6 g/dL (ref 30.0–36.0)
MCV: 87.7 fL (ref 80.0–100.0)
Platelets: 104 K/uL — ABNORMAL LOW (ref 150–400)
RBC: 3.57 MIL/uL — ABNORMAL LOW (ref 3.87–5.11)
RDW: 16.9 % — ABNORMAL HIGH (ref 11.5–15.5)
WBC: 1.4 K/uL — CL (ref 4.0–10.5)
nRBC: 0 % (ref 0.0–0.2)

## 2023-11-27 LAB — DIFFERENTIAL
Abs Immature Granulocytes: 0.01 K/uL (ref 0.00–0.07)
Basophils Absolute: 0 K/uL (ref 0.0–0.1)
Basophils Relative: 0 %
Eosinophils Absolute: 0 K/uL (ref 0.0–0.5)
Eosinophils Relative: 3 %
Immature Granulocytes: 1 %
Lymphocytes Relative: 36 %
Lymphs Abs: 0.5 K/uL — ABNORMAL LOW (ref 0.7–4.0)
Monocytes Absolute: 0.5 K/uL (ref 0.1–1.0)
Monocytes Relative: 36 %
Neutro Abs: 0.3 K/uL — CL (ref 1.7–7.7)
Neutrophils Relative %: 24 %
Smear Review: NORMAL

## 2023-11-27 LAB — CREATININE, SERUM
Creatinine, Ser: 0.71 mg/dL (ref 0.44–1.00)
GFR, Estimated: >60 mL/min (ref >60)

## 2023-11-27 MED ORDER — HYDRALAZINE HCL 20 MG/ML IJ SOLN: 5.0000 mg | INTRAMUSCULAR | Status: DC | PRN

## 2023-11-27 MED ORDER — ONDANSETRON HCL 4 MG PO TABS: 4.0000 mg | ORAL_TABLET | Freq: Four times a day (QID) | ORAL | Status: DC | PRN

## 2023-11-27 MED ORDER — BUSPIRONE HCL 5 MG PO TABS
5.0000 mg | ORAL_TABLET | Freq: Two times a day (BID) | ORAL | Status: DC
  Administered 2023-11-27 – 2023-11-28 (×3): 5 mg via ORAL
  Filled 2023-11-27 (×3): qty 1

## 2023-11-27 MED ORDER — PANTOPRAZOLE SODIUM 40 MG PO TBEC
40.0000 mg | DELAYED_RELEASE_TABLET | Freq: Every day | ORAL | Status: DC
  Administered 2023-11-27 – 2023-11-28 (×2): 40 mg via ORAL
  Filled 2023-11-27 (×2): qty 1

## 2023-11-27 MED ORDER — HYDROXYZINE HCL 25 MG PO TABS
25.0000 mg | ORAL_TABLET | Freq: Every day | ORAL | Status: DC
Start: 2023-11-27 — End: 2023-11-28
  Administered 2023-11-27 – 2023-11-28 (×2): 25 mg via ORAL
  Filled 2023-11-27 (×2): qty 1

## 2023-11-27 MED ORDER — ACETAMINOPHEN 325 MG PO TABS: 650.0000 mg | ORAL_TABLET | Freq: Four times a day (QID) | ORAL | Status: DC | PRN

## 2023-11-27 MED ORDER — SODIUM CHLORIDE 0.9 % IV SOLN: INTRAVENOUS | Status: AC

## 2023-11-27 MED ORDER — BUPROPION HCL ER (XL) 150 MG PO TB24
150.0000 mg | ORAL_TABLET | Freq: Every morning | ORAL | Status: DC
  Administered 2023-11-27 – 2023-11-28 (×2): 150 mg via ORAL
  Filled 2023-11-27 (×2): qty 1

## 2023-11-27 MED ORDER — ONDANSETRON HCL 4 MG/2ML IJ SOLN: 4.0000 mg | Freq: Four times a day (QID) | INTRAMUSCULAR | Status: DC | PRN

## 2023-11-27 MED ORDER — CLONIDINE HCL 0.1 MG PO TABS
0.1000 mg | ORAL_TABLET | Freq: Every day | ORAL | Status: DC
  Administered 2023-11-27: 0.1 mg via ORAL
  Filled 2023-11-27: qty 1

## 2023-11-27 MED ORDER — FLUOXETINE HCL 20 MG PO CAPS
40.0000 mg | ORAL_CAPSULE | Freq: Every day | ORAL | Status: DC
  Administered 2023-11-27 – 2023-11-28 (×2): 40 mg via ORAL
  Filled 2023-11-27 (×2): qty 2

## 2023-11-27 MED ORDER — NYSTATIN 100000 UNIT/ML MT SUSP
5.0000 mL | Freq: Four times a day (QID) | OROMUCOSAL | Status: DC
  Administered 2023-11-27 – 2023-11-28 (×5): 500000 [IU] via ORAL
  Filled 2023-11-27 (×5): qty 5

## 2023-11-27 MED ORDER — HEPARIN SODIUM (PORCINE) 5000 UNIT/ML IJ SOLN
5000.0000 [IU] | Freq: Three times a day (TID) | INTRAMUSCULAR | Status: DC
  Administered 2023-11-27 – 2023-11-28 (×4): 5000 [IU] via SUBCUTANEOUS
  Filled 2023-11-27 (×5): qty 1

## 2023-11-27 MED ORDER — ACETAMINOPHEN 650 MG RE SUPP: 650.0000 mg | Freq: Four times a day (QID) | RECTAL | Status: DC | PRN

## 2023-11-27 MED ORDER — LURASIDONE HCL 40 MG PO TABS
40.0000 mg | ORAL_TABLET | Freq: Every day | ORAL | Status: DC
  Administered 2023-11-27: 40 mg via ORAL
  Filled 2023-11-27: qty 1

## 2023-11-27 NOTE — Progress Notes (Signed)
   11/27/23 1433  TOC Brief Assessment  Insurance and Status Reviewed  Patient has primary care physician Yes Michail, Camie Pepper, PA-C)  Home environment has been reviewed Home  Prior level of function: Independent  Prior/Current Home Services No current home services  Social Drivers of Health Review SDOH reviewed no interventions necessary  Readmission risk has been reviewed Yes  Transition of care needs no transition of care needs at this time

## 2023-11-27 NOTE — Plan of Care (Signed)
 Discussed with patient plan of care for the evening, pain management and admission question with some teach back displayed  Problem: Education: Goal: Knowledge of General Education information will improve Description: Including pain rating scale, medication(s)/side effects and non-pharmacologic comfort measures Outcome: Progressing   Problem: Health Behavior/Discharge Planning: Goal: Ability to manage health-related needs will improve Outcome: Progressing   Problem: Pain Managment: Goal: General experience of comfort will improve and/or be controlled Outcome: Progressing

## 2023-11-27 NOTE — H&P (Signed)
 History and Physical    Erin [name] FMW:[id number] DOB: September 05, 1950 DOA: 11/26/2023  PCP: [name], PA-C   Chief Complaint: Fever, diarrhea  HPI: Erin [name] is a 73 y.o. female who presents with somewhat acute onset fever and diarrhea with notable neutropenia. Patient has medical history significant of anal cancer stage IIa, hyperlipidemia, mood disorder, alcohol use disorder, tobacco abuse, prior history of basal cell carcinoma of the nose status post excision.  As above presents with worsening diarrhea and neutropenia, hospitalist called for admission, radiation oncology and med oncology following as well.  ED Course: Noted fever at 102 absolute neutrophil count 400 CT abdomen pelvis negative for any acute findings.  Review of Systems: As per HPI including fever and diarrhea otherwise denies constipation headache chills shortness of breath or chest pain.   Assessment/Plan Principal Problem:   Neutropenic fever   Neutropenic fever, POA Diarrhea, transient, resolved - Patient reports fever at home, 102 here at intake - Absolute neutrophil count 400, 300 on repeat - Recently initiated on 5-FU/mitomyosin on 9/29 -2 weeks prior to admission; unclear if related due to timing, appreciate oncology insight and recommendations  Anal cancer, stage IIa (cT2, cN0), POA - Med oncology and radiation oncology following - Radiation oncology holding treatment today - New chemo on 9/29  DVT prophylaxis: heparin  injection 5,000 Units Start: 11/27/23 0830 Code Status: Full Family Communication: None present Status is: Inpatient  Dispo: The patient is from: Home              Anticipated d/c is to: Home              Anticipated d/c date is: 24 to 48 hours              Patient currently not medically stable for discharge given need for follow-up imaging and labs  Consultants:  Oncology, radiation oncology  Procedures:  None planned   Past Medical History:   Diagnosis Date   Alcohol abuse 11/08/2016   Alcoholism (HCC)    Depression    Hyperlipemia     Past Surgical History:  Procedure Laterality Date   NOSE SURGERY       reports that she has been smoking cigarettes. She has never used smokeless tobacco. She reports current alcohol use. She reports that she does not use drugs.  Allergies  Allergen Reactions   Lamictal [Lamotrigine] Swelling    Caused swelling in the face    Family History  Problem Relation Age of Onset   Heart failure Father     Prior to Admission medications   Medication Sig Start Date End Date Taking? Authorizing Provider  alum & mag hydroxide-simeth-diphenhydrAMINE-nystatin Swish and spit 5 mLs 4 (four) times daily as needed. 11/16/23     atorvastatin  (LIPITOR) 20 MG tablet Take 20 mg by mouth every evening. 02/25/20   [provider]  buPROPion  (WELLBUTRIN  XL) 150 MG 24 hr tablet Take 150 mg by mouth every morning. 03/20/20   [provider]  cloNIDine  (CATAPRES ) 0.1 MG tablet Take 0.1 mg by mouth at bedtime. 03/20/20   [provider]  diphenoxylate-atropine (LOMOTIL) 2.5-0.025 MG tablet Take 1-2 tablets by mouth 4 (four) times daily as needed for diarrhea or loose stools (maxiumu #8/day). 11/26/23   Cloretta Arley NOVAK, MD  FLUoxetine  (PROZAC ) 40 MG capsule Take 40 mg by mouth daily. 09/15/21   [provider]  hydrOXYzine  (ATARAX /VISTARIL ) 25 MG tablet Take 25 mg by mouth daily. 03/04/20   [provider]  LATUDA  80 MG TABS tablet Take 40 mg by mouth at bedtime. 01/13/20   [provider]  loperamide (IMODIUM) 2 MG capsule Take 4 mg by mouth as needed for diarrhea or loose stools.    [provider]  magic mouthwash SOLN Take 5 mLs by mouth 4 (four) times daily as needed for mouth pain (swish and spit). 11/16/23   Cloretta Arley NOVAK, MD  nicotine  (NICODERM CQ  - DOSED IN MG/24 HOURS) 21 mg/24hr patch Place 1 patch (21 mg total) onto the skin daily. 12/28/21    Sebastian Toribio GAILS, MD  pantoprazole  (PROTONIX ) 40 MG tablet Take 1 tablet (40 mg total) by mouth daily at 6 (six) AM. 12/28/21   Sebastian Toribio GAILS, MD  prochlorperazine  (COMPAZINE ) 10 MG tablet Take 1 tablet (10 mg total) by mouth every 6 (six) hours as needed for nausea or vomiting. 10/29/23   Cloretta Arley NOVAK, MD    Physical Exam: Vitals:   11/27/23 0200 11/27/23 0300 11/27/23 0318 11/27/23 0721  BP: 129/73 (!) 148/62  (!) 142/67  Pulse: 86 85  92  Resp: (!) 22 18  20   Temp:  99.2 F (37.3 C)  99.7 F (37.6 C)  TempSrc:  Oral  Oral  SpO2: 94% 96%  94%  Weight:   103.3 kg   Height:   5' 9 (1.753 m)     Constitutional: NAD, calm, comfortable Vitals:   11/27/23 0200 11/27/23 0300 11/27/23 0318 11/27/23 0721  BP: 129/73 (!) 148/62  (!) 142/67  Pulse: 86 85  92  Resp: (!) 22 18  20   Temp:  99.2 F (37.3 C)  99.7 F (37.6 C)  TempSrc:  Oral  Oral  SpO2: 94% 96%  94%  Weight:   103.3 kg   Height:   5' 9 (1.753 m)    General:  Pleasantly resting in bed, No acute distress. HEENT:  Normocephalic atraumatic.  Sclerae nonicteric, noninjected.  Extraocular movements intact bilaterally. Neck:  Without mass or deformity.  Trachea is midline. Lungs:  Clear to auscultate bilaterally without rhonchi, wheeze, or rales. Heart:  Regular rate and rhythm.  Without murmurs, rubs, or gallops. Abdomen:  Soft, nontender, nondistended.  Without guarding or rebound. Extremities: Without cyanosis, clubbing, edema, or obvious deformity. Skin:  Warm and dry, no erythema.  Labs on Admission: I have personally reviewed following labs and imaging studies  CBC: Recent Labs  Lab 11/23/23 1430 11/26/23 1830  WBC 1.8* 1.4*  NEUTROABS 0.9* 0.4*  HGB 10.9* 10.6*  HCT 32.8* 31.7*  MCV 84.1 83.6  PLT 164 114*   Basic Metabolic Panel: Recent Labs  Lab 11/23/23 1430 11/26/23 1830  NA 137 136  K 4.0 4.2  CL 103 102  CO2 25 24  GLUCOSE 109* 104*  BUN 9 9  CREATININE 0.64 0.68  CALCIUM  8.8*  9.5   GFR: Estimated Creatinine Clearance: 82.5 mL/min (by C-G formula based on SCr of 0.68 mg/dL). Liver Function Tests: Recent Labs  Lab 11/23/23 1430 11/26/23 1830  AST 30 17  ALT 39 29  ALKPHOS 99 94  BILITOT 0.2 0.3  PROT 5.9* 6.3*  ALBUMIN 3.7 3.9   Coagulation Profile: Recent Labs  Lab 11/26/23 1830  INR 0.9   Urine analysis:    Component Value Date/Time   COLORURINE COLORLESS (A) 11/26/2023 1840   APPEARANCEUR CLEAR 11/26/2023 1840   LABSPEC 1.009 11/26/2023 1840   PHURINE 6.0 11/26/2023 1840   GLUCOSEU NEGATIVE 11/26/2023 1840   HGBUR NEGATIVE  11/26/2023 1840   BILIRUBINUR NEGATIVE 11/26/2023 1840   KETONESUR NEGATIVE 11/26/2023 1840   PROTEINUR NEGATIVE 11/26/2023 1840   UROBILINOGEN 0.2 09/06/2009 1350   NITRITE NEGATIVE 11/26/2023 1840   LEUKOCYTESUR NEGATIVE 11/26/2023 1840    Radiological Exams on Admission: CT ABDOMEN PELVIS W CONTRAST Result Date: 11/26/2023 CLINICAL DATA:  Neutropenic fever, rectal cancer EXAM: CT ABDOMEN AND PELVIS WITH CONTRAST TECHNIQUE: Multidetector CT imaging of the abdomen and pelvis was performed using the standard protocol following bolus administration of intravenous contrast. RADIATION DOSE REDUCTION: This exam was performed according to the departmental dose-optimization program which includes automated exposure control, adjustment of the mA and/or kV according to patient size and/or use of iterative reconstruction technique. CONTRAST:  OMNIPAQUE  IOHEXOL  300 MG/ML  SOLN COMPARISON:  None Available. FINDINGS: Lower chest: No acute findings. Hepatobiliary: 5 cm central cyst in the right hepatic lobe. No suspicious focal hepatic abnormality. Gallbladder unremarkable. No biliary ductal dilatation. Pancreas: No focal abnormality or ductal dilatation. Spleen: No focal abnormality.  Normal size. Adrenals/Urinary Tract: No adrenal abnormality. No focal renal abnormality. No stones or hydronephrosis. Urinary bladder is unremarkable.  Stomach/Bowel: Stomach is within normal limits. Appendix appears normal. No evidence of bowel wall thickening, distention, or inflammatory changes. Vascular/Lymphatic: Are aortic atherosclerosis. No evidence of aneurysm or adenopathy. Reproductive: Uterus and adnexa unremarkable.  No mass. Other: No free fluid or free air. Musculoskeletal: No acute bony abnormality. IMPRESSION: No acute findings in the abdomen or pelvis. Aortic atherosclerosis. Electronically Signed   By: Franky Crease M.D.   On: 11/26/2023 22:04   DG Chest 2 View if patient is not in a treatment room. Result Date: 11/26/2023 EXAM: 2 VIEW(S) XRAY OF THE CHEST 11/26/2023 06:30:00 PM COMPARISON: Comparison day chest x-ray 12/21/2021. CLINICAL HISTORY: Suspected Sepsis. Reports fever and diarrhea starting today. Currently getting treatment for Anal/colorectal cancer. Active chemo. Week 3. FINDINGS: LUNGS AND PLEURA: No focal pulmonary opacity. No pulmonary edema. No pleural effusion. No pneumothorax. HEART AND MEDIASTINUM: No acute abnormality of the cardiac and mediastinal silhouettes. BONES AND SOFT TISSUES: No acute osseous abnormality. IMPRESSION: 1. No acute cardiopulmonary disease Electronically signed by: Greig Pique MD 11/26/2023 06:56 PM EDT RP Workstation: [id number]    EKG: Independently reviewed.   Elsie JAYSON Montclair DO [facility] For contact please use secure messenger on Epic  If 7PM-7AM, please contact night-coverage located on [email or web address]   11/27/2023, 7:28 AM

## 2023-11-28 ENCOUNTER — Other Ambulatory Visit (HOSPITAL_COMMUNITY): Payer: Self-pay

## 2023-11-28 ENCOUNTER — Other Ambulatory Visit: Payer: Self-pay | Admitting: Radiation Oncology

## 2023-11-28 ENCOUNTER — Inpatient Hospital Stay

## 2023-11-28 ENCOUNTER — Other Ambulatory Visit: Payer: Self-pay

## 2023-11-28 ENCOUNTER — Ambulatory Visit

## 2023-11-28 DIAGNOSIS — R5081 Fever presenting with conditions classified elsewhere: Secondary | ICD-10-CM | POA: Diagnosis not present

## 2023-11-28 DIAGNOSIS — D709 Neutropenia, unspecified: Secondary | ICD-10-CM | POA: Diagnosis not present

## 2023-11-28 DIAGNOSIS — C210 Malignant neoplasm of anus, unspecified: Secondary | ICD-10-CM

## 2023-11-28 LAB — CBC WITH DIFFERENTIAL/PLATELET
Abs Immature Granulocytes: 0.01 K/uL (ref 0.00–0.07)
Basophils Absolute: 0 K/uL (ref 0.0–0.1)
Basophils Relative: 1 %
Eosinophils Absolute: 0 K/uL (ref 0.0–0.5)
Eosinophils Relative: 2 %
HCT: 31.1 % — ABNORMAL LOW (ref 36.0–46.0)
Hemoglobin: 9.9 g/dL — ABNORMAL LOW (ref 12.0–15.0)
Immature Granulocytes: 1 %
Lymphocytes Relative: 40 %
Lymphs Abs: 0.6 K/uL — ABNORMAL LOW (ref 0.7–4.0)
MCH: 27.3 pg (ref 26.0–34.0)
MCHC: 31.8 g/dL (ref 30.0–36.0)
MCV: 85.7 fL (ref 80.0–100.0)
Monocytes Absolute: 0.5 K/uL (ref 0.1–1.0)
Monocytes Relative: 31 %
Neutro Abs: 0.4 K/uL — CL (ref 1.7–7.7)
Neutrophils Relative %: 25 %
Platelets: 104 K/uL — ABNORMAL LOW (ref 150–400)
RBC: 3.63 MIL/uL — ABNORMAL LOW (ref 3.87–5.11)
RDW: 17 % — ABNORMAL HIGH (ref 11.5–15.5)
WBC: 1.6 K/uL — ABNORMAL LOW (ref 4.0–10.5)
nRBC: 0 % (ref 0.0–0.2)

## 2023-11-28 LAB — BASIC METABOLIC PANEL WITH GFR
Anion gap: 10 (ref 5–15)
BUN: 9 mg/dL (ref 8–23)
CO2: 25 mmol/L (ref 22–32)
Calcium: 8.7 mg/dL — ABNORMAL LOW (ref 8.9–10.3)
Chloride: 106 mmol/L (ref 98–111)
Creatinine, Ser: 0.65 mg/dL (ref 0.44–1.00)
GFR, Estimated: >60 mL/min (ref >60)
Glucose, Bld: 102 mg/dL — ABNORMAL HIGH (ref 70–99)
Potassium: 4 mmol/L (ref 3.5–5.1)
Sodium: 140 mmol/L (ref 135–145)

## 2023-11-28 LAB — PATHOLOGIST SMEAR REVIEW

## 2023-11-28 MED ORDER — CIPROFLOXACIN HCL 500 MG PO TABS
500.0000 mg | ORAL_TABLET | Freq: Two times a day (BID) | ORAL | 0 refills | Status: AC
  Filled 2023-11-28: qty 10, 5d supply, fill #0

## 2023-11-28 MED ORDER — NICOTINE 21 MG/24HR TD PT24: 21.0000 mg | MEDICATED_PATCH | TRANSDERMAL | Status: DC

## 2023-11-28 MED ORDER — EZETIMIBE 10 MG PO TABS: 10.0000 mg | ORAL_TABLET | Freq: Every day | ORAL | Status: DC

## 2023-11-28 MED ORDER — ATORVASTATIN CALCIUM 40 MG PO TABS: 80.0000 mg | ORAL_TABLET | Freq: Every day | ORAL | Status: DC

## 2023-11-28 NOTE — Progress Notes (Addendum)
 Erin [name]   DOB:Nov 30, 1950   FM#:[id number]      ASSESSMENT & PLAN:  Erin [name] is a 73 year old female patient with oncologic history significant for cancer, on chemotherapy and radiation treatments.  She was admitted 11/27/2023 with complaints of fever and diarrhea and found to be neutropenic.  Medical oncology/Dr. Cloretta has been following closely.  Neutropenic fever - Fever improving - Monitor fever curve closely - WBC 1.6 with ANC 0.4 - Continue to monitor closely  Anal cancer, stage IIa (cT2, cN0) - Diagnosed 09/27/2023 - Initiated chemotherapy regimen with mitomycin + 5-FU, cycle 1 given 11/12/2023.  Also on radiation therapy. - Medical oncology/Dr. Cloretta following closely and will follow as outpatient.  Anemia Thrombocytopenia - Likely multifactorial - Hemoglobin 9.9, stable - Platelets 104K, stable - continue to monitor CBC with differential closely    Code Status Full   Subjective:  Patient seen awake alert and oriented x 4 laying in bed.  Reports that she feels much better and is looking forward to going home today.  Still with ongoing discomfort in her perineal area.  No other acute complaints offered.  Objective:   Intake/Output Summary (Last 24 hours) at 11/28/2023 1010 Last data filed at 11/27/2023 1959 Gross per 24 hour  Intake 589.57 ml  Output --  Net 589.57 ml     PHYSICAL EXAMINATION: ECOG PERFORMANCE STATUS: 1 - Symptomatic but completely ambulatory  Vitals:   11/27/23 1940 11/28/23 0412  BP: (!) 141/66 123/61  Pulse: 87 78  Resp: 19 17  Temp: 99.3 F (37.4 C) 99.1 F (37.3 C)  SpO2: 92% 94%   Filed Weights   11/27/23 0318  Weight: 227 lb 11.8 oz (103.3 kg)    GENERAL: alert, no distress and comfortable SKIN: Erythema and superficial skin breakdown at the perineum EYES: normal, conjunctiva are pink and non-injected, sclera clear OROPHARYNX: no exudate, no erythema and lips, buccal mucosa, and tongue  normal  NECK: supple, thyroid normal size, non-tender, without nodularity LYMPH: no palpable lymphadenopathy in the cervical, axillary or inguinal LUNGS: clear to auscultation and percussion with normal breathing effort HEART: regular rate & rhythm and no murmurs and no lower extremity edema ABDOMEN: abdomen soft, non-tender and normal bowel sounds MUSCULOSKELETAL: no cyanosis of digits and no clubbing  PSYCH: alert & oriented x 3 with fluent speech NEURO: no focal motor/sensory deficits   All questions were answered. The patient knows to call the clinic with any problems, questions or concerns.   The total time spent in the appointment was30 minutes encounter with patient including review of chart and various tests results, discussions about plan of care and coordination of care plan  Olam JINNY Brunner, NP 11/28/2023 10:10 AM    Labs Reviewed:  Lab Results  Component Value Date   WBC 1.6 (L) 11/28/2023   HGB 9.9 (L) 11/28/2023   HCT 31.1 (L) 11/28/2023   MCV 85.7 11/28/2023   PLT 104 (L) 11/28/2023   Recent Labs    11/12/23 1117 11/23/23 1430 11/26/23 1830 11/27/23 0749 11/28/23 0707  NA 137 137 136  --  140  K 4.3 4.0 4.2  --  4.0  CL 104 103 102  --  106  CO2 22 25 24   --  25  GLUCOSE 152* 109* 104*  --  102*  BUN 12 9 9   --  9  CREATININE 0.74 0.64 0.68 0.71 0.65  CALCIUM  9.4 8.8* 9.5  --  8.7*  GFRNONAA >60 >60 >60 >60 >60  PROT 6.3* 5.9* 6.3*  --   --   ALBUMIN 3.7 3.7 3.9  --   --   AST 22 30 17   --   --   ALT 30 39 29  --   --   ALKPHOS 112 99 94  --   --   BILITOT 0.3 0.2 0.3  --   --     Studies Reviewed:  CT ABDOMEN PELVIS W CONTRAST Result Date: 11/26/2023 CLINICAL DATA:  Neutropenic fever, rectal cancer EXAM: CT ABDOMEN AND PELVIS WITH CONTRAST TECHNIQUE: Multidetector CT imaging of the abdomen and pelvis was performed using the standard protocol following bolus administration of intravenous contrast. RADIATION DOSE REDUCTION: This exam was performed  according to the departmental dose-optimization program which includes automated exposure control, adjustment of the mA and/or kV according to patient size and/or use of iterative reconstruction technique. CONTRAST:  OMNIPAQUE  IOHEXOL  300 MG/ML  SOLN COMPARISON:  None Available. FINDINGS: Lower chest: No acute findings. Hepatobiliary: 5 cm central cyst in the right hepatic lobe. No suspicious focal hepatic abnormality. Gallbladder unremarkable. No biliary ductal dilatation. Pancreas: No focal abnormality or ductal dilatation. Spleen: No focal abnormality.  Normal size. Adrenals/Urinary Tract: No adrenal abnormality. No focal renal abnormality. No stones or hydronephrosis. Urinary bladder is unremarkable. Stomach/Bowel: Stomach is within normal limits. Appendix appears normal. No evidence of bowel wall thickening, distention, or inflammatory changes. Vascular/Lymphatic: Are aortic atherosclerosis. No evidence of aneurysm or adenopathy. Reproductive: Uterus and adnexa unremarkable.  No mass. Other: No free fluid or free air. Musculoskeletal: No acute bony abnormality. IMPRESSION: No acute findings in the abdomen or pelvis. Aortic atherosclerosis. Electronically Signed   By: Franky Crease M.D.   On: 11/26/2023 22:04   DG Chest 2 View if patient is not in a treatment room. Result Date: 11/26/2023 EXAM: 2 VIEW(S) XRAY OF THE CHEST 11/26/2023 06:30:00 PM COMPARISON: Comparison day chest x-ray 12/21/2021. CLINICAL HISTORY: Suspected Sepsis. Reports fever and diarrhea starting today. Currently getting treatment for Anal/colorectal cancer. Active chemo. Week 3. FINDINGS: LUNGS AND PLEURA: No focal pulmonary opacity. No pulmonary edema. No pleural effusion. No pneumothorax. HEART AND MEDIASTINUM: No acute abnormality of the cardiac and mediastinal silhouettes. BONES AND SOFT TISSUES: No acute osseous abnormality. IMPRESSION: 1. No acute cardiopulmonary disease Electronically signed by: Greig Pique MD 11/26/2023 06:56  PM EDT RP Workstation: [id number]   IR PICC PLACEMENT LEFT >5 YRS INC IMG GUIDE Result Date: 11/12/2023 INDICATION: Anal cancer.  PICC line placement for medical therapy. EXAM: Dual lumen PICC LINE PLACEMENT WITH ULTRASOUND AND FLUOROSCOPIC GUIDANCE MEDICATIONS: None; The antibiotic was administered within an appropriate time interval prior to skin puncture. FLUOROSCOPY: Radiation Exposure Index (as provided by the fluoroscopic device): 4 mGy Kerma COMPLICATIONS: None immediate. PROCEDURE: The patient was advised of the possible risks and complications and agreed to undergo the procedure. The patient was then brought to the angiographic suite for the procedure. The left arm was prepped with chlorhexidine , draped in the usual sterile fashion using maximum barrier technique (cap and mask, sterile gown, sterile gloves, large sterile sheet, hand hygiene and cutaneous antisepsis) and infiltrated locally with 1% Lidocaine . Ultrasound demonstrated patency of the left basilic vein, and this was documented with an image. Under real-time ultrasound guidance, this vein was accessed with a 21 gauge micropuncture needle and image documentation was performed. A 0.018 wire was introduced in to the vein. Over this, a 4 [place] dual lumen power injectable PICC was advanced to the lower SVC/right atrial junction. Fluoroscopy during the  procedure and fluoro spot radiograph confirms appropriate catheter position. The catheter was flushed and covered with asterile dressing. Tip of the catheter at the cavoatrial junction. Catheter length: 49 cm IMPRESSION: Successful left arm power PICC line placement with ultrasound and fluoroscopic guidance. The catheter is ready for use. Electronically Signed   By: Cordella Banner   On: 11/12/2023 08:54   NM PET Image Initial (PI) Skull Base To Thigh (F-18 FDG) Result Date: 11/04/2023 CLINICAL DATA:  Initial treatment strategy for anal carcinoma. EXAM: NUCLEAR MEDICINE PET SKULL BASE TO THIGH  TECHNIQUE: 11.1 mCi F-18 FDG was injected intravenously. Full-ring PET imaging was performed from the skull base to thigh after the radiotracer. CT data was obtained and used for attenuation correction and anatomic localization. Fasting blood glucose: 97 mg/dl COMPARISON:  None Available. FINDINGS: Mediastinal blood-pool activity (background): SUV max = 5.4 Liver activity (reference): SUV max = N/A NECK:  No hypermetabolic lymph nodes or masses. Incidental CT findings:  None. CHEST: No hypermetabolic lymph nodes. No suspicious pulmonary nodules seen on CT images. Incidental CT findings:  None. ABDOMEN/PELVIS: Hypermetabolic soft tissue prominence is seen at the anorectal junction which has SUV max of 16.4. This is consistent with known anal carcinoma. No hypermetabolic lymph nodes in the pelvis or abdomen. No abnormal hypermetabolic activity within the liver, pancreas, adrenal glands, or spleen. Incidental CT findings:  Right hepatic lobe cyst noted. SKELETON: No focal hypermetabolic bone lesions to suggest skeletal metastasis. Incidental CT findings:  None. IMPRESSION: Hypermetabolic soft tissue prominence at the anorectal junction, consistent with known primary anal carcinoma. No evidence of metastatic disease. Electronically Signed   By: Norleen DELENA Kil M.D.   On: 11/04/2023 16:58   Erin [name] is known to me with a history of anal cancer, status post cycle one 5-FU/Mitomycin-C 11/12/2023.  She continues daily radiation.  She presented the emergency room on 11/26/2023 with a fever.  She reports 1 day of diarrhea.  No dyspnea, cough, or dysuria.  She was noted to have severe neutropenia.  She was admitted and placed on broad-spectrum intravenous antibiotics. She reports feeling well at present.  No further fever.  The platelet count and neutrophils are stable today.  Cultures are negative.  No source for infection has been identified.  She is now at day 17 following chemotherapy.  She appears well.  She is  scheduled for discharge to home today.  I recommend continuing antibiotics with close follow-up of the neutrophil count.  She should call for a recurrent fever.    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  No evidence of metastatic disease. Radiation 11/12/2023  Cycle 1 5-FU/Mitomycin-C 11/12/2023 G2, P2 Basal cell carcinoma of the nose Hyperlipidemia Anxiety/depression Alcoholism Tobacco use Admission 11/26/2023 with fever and neutropenia   Recommendations: CBC with differential 11/29/2023 Call for a recurrent fever Outpatient follow-up will be scheduled in the medical oncology clinic 11/30/2023 Continue antibiotics at discharge from the hospital Management of perineal skin breakdown per radiation oncology

## 2023-11-28 NOTE — Progress Notes (Signed)
 Discharge med ina secure bag delivered to patient in room by this RN

## 2023-11-28 NOTE — Plan of Care (Signed)

## 2023-11-28 NOTE — Discharge Summary (Signed)
 Physician Discharge Summary  JERZIE BIERI FMW:[id number] DOB: [birth year] DOA: 11/26/2023  PCP: [name], PA-C  Admit date: 11/26/2023 Discharge date: 11/28/2023  Admitted From: Home Disposition:  Home  Recommendations for Outpatient Follow-up:  Follow up with PCP in 1-2 weeks Please obtain BMP/CBC in one week Please follow up on the following pending results:  Home Health:None  Equipment/Devices:None  Discharge Condition:Stable  CODE STATUS:Full  Diet recommendation:  As tolerated  Brief/Interim Summary: Erin [name] is a 73 y.o. female who presents with somewhat acute onset fever and diarrhea with notable neutropenia. Patient has medical history significant of anal cancer stage IIa, hyperlipidemia, mood disorder, alcohol use disorder, tobacco abuse, prior history of basal cell carcinoma of the nose status post excision.  As above presents with worsening diarrhea and neutropenia, hospitalist called for admission, radiation oncology and med oncology following as well.  Patient admitted as above with sudden onset fever with notable neutropenia in the setting of recent chemotherapy and radiation for established cancer.  Fortunately patient remained afebrile over the past 24 hours, her absolute neutrophil count is rising appropriately and given her lack of symptoms otherwise stable and agreeable for discharge home.  Discussed case at length with med oncology, and rads oncology -defer to their expertise for reinitiation of treatment.  Single episode of diarrhea prior to hospitalization appears to have resolved on its own with supportive care -no other signs or symptoms of infection at this time.  Discharge Diagnoses:  Principal Problem:   Neutropenic fever   Neutropenic fever, POA, resolved Diarrhea, transient, resolved - Patient reports fever at home,102 here at intake without repeat febrile event - Absolute neutrophil count increasing to 500 today -  Recently initiated on 5-FU/mitomyosin on 9/29 -2 weeks prior to admission; unclear if related due to timing, appreciate oncology insight and recommendations   Anal cancer, stage IIa (cT2, cN0), POA - Med oncology and radiation oncology following - Radiation oncology holding treatment today - New chemo on 9/29  Discharge Instructions  Discharge Instructions     Call MD for:  difficulty breathing, headache or visual disturbances   Complete by: As directed    Call MD for:  extreme fatigue   Complete by: As directed    Call MD for:  hives   Complete by: As directed    Call MD for:  persistant dizziness or light-headedness   Complete by: As directed    Call MD for:  persistant nausea and vomiting   Complete by: As directed    Call MD for:  severe uncontrolled pain   Complete by: As directed    Call MD for:  temperature >100.4   Complete by: As directed    Diet - low sodium heart healthy   Complete by: As directed    Increase activity slowly   Complete by: As directed       Allergies as of 11/28/2023       Reactions   Lamictal [lamotrigine] Swelling   Caused swelling in the face        Medication List     STOP taking these medications    diphenoxylate-atropine 2.5-0.025 MG tablet Commonly known as: LOMOTIL       TAKE these medications    alendronate 70 MG tablet Commonly known as: FOSAMAX Take 70 mg by mouth every 7 (seven) days. Tuesdays   ALPRAZolam 0.5 MG tablet Commonly known as: XANAX Take 0.5 mg by mouth daily as needed for anxiety.   alum & mag  hydroxide-simeth-diphenhydrAMINE-nystatin Swish and spit 5 mLs 4 (four) times daily as needed. What changed: reasons to take this   atorvastatin  80 MG tablet Commonly known as: LIPITOR Take 80 mg by mouth at bedtime.   buPROPion  150 MG 24 hr tablet Commonly known as: WELLBUTRIN  XL Take 150 mg by mouth every morning.   busPIRone 5 MG tablet Commonly known as: BUSPAR Take 5 mg by mouth 2 (two) times  daily.   ciprofloxacin 500 MG tablet Commonly known as: Cipro Take 1 tablet (500 mg total) by mouth 2 (two) times daily for 5 days.   cloNIDine  0.1 MG tablet Commonly known as: CATAPRES  Take 0.2 mg by mouth at bedtime.   ezetimibe 10 MG tablet Commonly known as: ZETIA Take 10 mg by mouth daily.   FLUoxetine  40 MG capsule Commonly known as: PROZAC  Take 40 mg by mouth daily.   hydrOXYzine  25 MG tablet Commonly known as: ATARAX  Take 50 mg by mouth at bedtime.   loperamide 2 MG capsule Commonly known as: IMODIUM Take 4 mg by mouth as needed for diarrhea or loose stools.   lurasidone  40 MG Tabs tablet Commonly known as: LATUDA  Take 40 mg by mouth at bedtime.   nicotine  21 mg/24hr patch Commonly known as: NICODERM CQ  - dosed in mg/24 hours Place 1 patch (21 mg total) onto the skin daily.   nystatin 100000 UNIT/ML suspension Commonly known as: MYCOSTATIN Take by mouth.   pantoprazole  40 MG tablet Commonly known as: PROTONIX  Take 1 tablet (40 mg total) by mouth daily at 6 (six) AM. What changed: when to take this   prochlorperazine  10 MG tablet Commonly known as: COMPAZINE  Take 1 tablet (10 mg total) by mouth every 6 (six) hours as needed for nausea or vomiting.        Allergies  Allergen Reactions   Lamictal [Lamotrigine] Swelling    Caused swelling in the face    Consultations: Oncology, radiation oncology  Procedures/Studies: CT ABDOMEN PELVIS W CONTRAST Result Date: 11/26/2023 CLINICAL DATA:  Neutropenic fever, rectal cancer EXAM: CT ABDOMEN AND PELVIS WITH CONTRAST TECHNIQUE: Multidetector CT imaging of the abdomen and pelvis was performed using the standard protocol following bolus administration of intravenous contrast. RADIATION DOSE REDUCTION: This exam was performed according to the departmental dose-optimization program which includes automated exposure control, adjustment of the mA and/or kV according to patient size and/or use of iterative  reconstruction technique. CONTRAST:  OMNIPAQUE  IOHEXOL  300 MG/ML  SOLN COMPARISON:  None Available. FINDINGS: Lower chest: No acute findings. Hepatobiliary: 5 cm central cyst in the right hepatic lobe. No suspicious focal hepatic abnormality. Gallbladder unremarkable. No biliary ductal dilatation. Pancreas: No focal abnormality or ductal dilatation. Spleen: No focal abnormality.  Normal size. Adrenals/Urinary Tract: No adrenal abnormality. No focal renal abnormality. No stones or hydronephrosis. Urinary bladder is unremarkable. Stomach/Bowel: Stomach is within normal limits. Appendix appears normal. No evidence of bowel wall thickening, distention, or inflammatory changes. Vascular/Lymphatic: Are aortic atherosclerosis. No evidence of aneurysm or adenopathy. Reproductive: Uterus and adnexa unremarkable.  No mass. Other: No free fluid or free air. Musculoskeletal: No acute bony abnormality. IMPRESSION: No acute findings in the abdomen or pelvis. Aortic atherosclerosis. Electronically Signed   By: Franky Crease M.D.   On: 11/26/2023 22:04   DG Chest 2 View if patient is not in a treatment room. Result Date: 11/26/2023 EXAM: 2 VIEW(S) XRAY OF THE CHEST 11/26/2023 06:30:00 PM COMPARISON: Comparison day chest x-ray 12/21/2021. CLINICAL HISTORY: Suspected Sepsis. Reports fever and diarrhea starting  today. Currently getting treatment for Anal/colorectal cancer. Active chemo. Week 3. FINDINGS: LUNGS AND PLEURA: No focal pulmonary opacity. No pulmonary edema. No pleural effusion. No pneumothorax. HEART AND MEDIASTINUM: No acute abnormality of the cardiac and mediastinal silhouettes. BONES AND SOFT TISSUES: No acute osseous abnormality. IMPRESSION: 1. No acute cardiopulmonary disease Electronically signed by: Greig Pique MD 11/26/2023 06:56 PM EDT RP Workstation: [id number]   IR PICC PLACEMENT LEFT >5 YRS INC IMG GUIDE Result Date: 11/12/2023 INDICATION: Anal cancer.  PICC line placement for medical therapy. EXAM:  Dual lumen PICC LINE PLACEMENT WITH ULTRASOUND AND FLUOROSCOPIC GUIDANCE MEDICATIONS: None; The antibiotic was administered within an appropriate time interval prior to skin puncture. FLUOROSCOPY: Radiation Exposure Index (as provided by the fluoroscopic device): 4 mGy Kerma COMPLICATIONS: None immediate. PROCEDURE: The patient was advised of the possible risks and complications and agreed to undergo the procedure. The patient was then brought to the angiographic suite for the procedure. The left arm was prepped with chlorhexidine , draped in the usual sterile fashion using maximum barrier technique (cap and mask, sterile gown, sterile gloves, large sterile sheet, hand hygiene and cutaneous antisepsis) and infiltrated locally with 1% Lidocaine . Ultrasound demonstrated patency of the left basilic vein, and this was documented with an image. Under real-time ultrasound guidance, this vein was accessed with a 21 gauge micropuncture needle and image documentation was performed. A 0.018 wire was introduced in to the vein. Over this, a 4 [place] dual lumen power injectable PICC was advanced to the lower SVC/right atrial junction. Fluoroscopy during the procedure and fluoro spot radiograph confirms appropriate catheter position. The catheter was flushed and covered with asterile dressing. Tip of the catheter at the cavoatrial junction. Catheter length: 49 cm IMPRESSION: Successful left arm power PICC line placement with ultrasound and fluoroscopic guidance. The catheter is ready for use. Electronically Signed   By: Cordella Banner   On: 11/12/2023 08:54   NM PET Image Initial (PI) Skull Base To Thigh (F-18 FDG) Result Date: 11/04/2023 CLINICAL DATA:  Initial treatment strategy for anal carcinoma. EXAM: NUCLEAR MEDICINE PET SKULL BASE TO THIGH TECHNIQUE: 11.1 mCi F-18 FDG was injected intravenously. Full-ring PET imaging was performed from the skull base to thigh after the radiotracer. CT data was obtained and used for  attenuation correction and anatomic localization. Fasting blood glucose: 97 mg/dl COMPARISON:  None Available. FINDINGS: Mediastinal blood-pool activity (background): SUV max = 5.4 Liver activity (reference): SUV max = N/A NECK:  No hypermetabolic lymph nodes or masses. Incidental CT findings:  None. CHEST: No hypermetabolic lymph nodes. No suspicious pulmonary nodules seen on CT images. Incidental CT findings:  None. ABDOMEN/PELVIS: Hypermetabolic soft tissue prominence is seen at the anorectal junction which has SUV max of 16.4. This is consistent with known anal carcinoma. No hypermetabolic lymph nodes in the pelvis or abdomen. No abnormal hypermetabolic activity within the liver, pancreas, adrenal glands, or spleen. Incidental CT findings:  Right hepatic lobe cyst noted. SKELETON: No focal hypermetabolic bone lesions to suggest skeletal metastasis. Incidental CT findings:  None. IMPRESSION: Hypermetabolic soft tissue prominence at the anorectal junction, consistent with known primary anal carcinoma. No evidence of metastatic disease. Electronically Signed   By: Norleen DELENA Kil M.D.   On: 11/04/2023 16:58     Subjective: No acute issues or events overnight   Discharge Exam: Vitals:   11/27/23 1940 11/28/23 0412  BP: (!) 141/66 123/61  Pulse: 87 78  Resp: 19 17  Temp: 99.3 F (37.4 C) 99.1 F (37.3 C)  SpO2:  92% 94%   Vitals:   11/27/23 1105 11/27/23 1717 11/27/23 1940 11/28/23 0412  BP: 131/66 (!) 142/68 (!) 141/66 123/61  Pulse: 92 88 87 78  Resp: 20 20 19 17   Temp: 99.2 F (37.3 C) 100 F (37.8 C) 99.3 F (37.4 C) 99.1 F (37.3 C)  TempSrc: Oral Oral Oral Oral  SpO2: 94% 95% 92% 94%  Weight:      Height:        General: Pt is alert, awake, not in acute distress Cardiovascular: RRR, S1/S2 +, no rubs, no gallops Respiratory: CTA bilaterally, no wheezing, no rhonchi Abdominal: Soft, NT, ND, bowel sounds + Extremities: no edema, no cyanosis    The results of significant  diagnostics from this hospitalization (including imaging, microbiology, ancillary and laboratory) are listed below for reference.     Microbiology: Recent Results (from the past 240 hours)  Culture, blood (Routine x 2)     Status: None (Preliminary result)   Collection Time: 11/26/23  6:40 PM   Specimen: BLOOD  Result Value Ref Range Status   Specimen Description   Final    BLOOD RIGHT ANTECUBITAL Performed at [facility], [address], [place], [place] [address]    Special Requests   Final    BOTTLES DRAWN AEROBIC AND ANAEROBIC Blood Culture adequate volume Performed at [facility], [address] [place], [place] [address]    Culture   Final    NO GROWTH 2 DAYS Performed at [facility], [address], [place], [place] [address]    Report Status PENDING  Incomplete  Culture, blood (Routine x 2)     Status: None (Preliminary result)   Collection Time: 11/26/23  7:23 PM   Specimen: BLOOD RIGHT WRIST  Result Value Ref Range Status   Specimen Description   Final    BLOOD RIGHT WRIST Performed at [facility], [address] [place], [place] [address]    Special Requests   Final    BOTTLES DRAWN AEROBIC ONLY Blood Culture results may not be optimal due to an inadequate volume of blood received in culture bottles Performed at [facility], [address], [place], [place] [address]    Culture   Final    NO GROWTH 2 DAYS Performed at [facility], [address], [place], [place] [address]    Report Status PENDING  Incomplete     Labs: BNP (last 3 results) No results for input(s): BNP in the last 8760 hours. Basic Metabolic Panel: Recent Labs  Lab 11/23/23 1430 11/26/23 1830 11/27/23 0749 11/28/23 0707  NA 137 136  --  140  K 4.0 4.2  --  4.0  CL 103 102  --  106  CO2 25 24  --  25  GLUCOSE 109* 104*  --  102*  BUN 9 9  --  9  CREATININE 0.64 0.68 0.71 0.65  CALCIUM  8.8* 9.5  --   8.7*   Liver Function Tests: Recent Labs  Lab 11/23/23 1430 11/26/23 1830  AST 30 17  ALT 39 29  ALKPHOS 99 94  BILITOT 0.2 0.3  PROT 5.9* 6.3*  ALBUMIN 3.7 3.9   No results for input(s): LIPASE, AMYLASE in the last 168 hours. No results for input(s): AMMONIA in the last 168 hours. CBC: Recent Labs  Lab 11/23/23 1430 11/26/23 1830 11/27/23 0749 11/28/23 0707  WBC 1.8* 1.4* 1.4* 1.6*  NEUTROABS 0.9* 0.4* 0.3* 0.4*  HGB 10.9*  10.6* 9.9* 9.9*  HCT 32.8* 31.7* 31.3* 31.1*  MCV 84.1 83.6 87.7 85.7  PLT 164 114* 104* 104*   Cardiac Enzymes: No results for input(s): CKTOTAL, CKMB, CKMBINDEX, TROPONINI in the last 168 hours. BNP: Invalid input(s): POCBNP CBG: No results for input(s): GLUCAP in the last 168 hours. D-Dimer No results for input(s): DDIMER in the last 72 hours. Hgb A1c No results for input(s): HGBA1C in the last 72 hours. Lipid Profile No results for input(s): CHOL, HDL, LDLCALC, TRIG, CHOLHDL, LDLDIRECT in the last 72 hours. Thyroid function studies No results for input(s): TSH, T4TOTAL, T3FREE, THYROIDAB in the last 72 hours.  Invalid input(s): FREET3 Anemia work up No results for input(s): VITAMINB12, FOLATE, FERRITIN, TIBC, IRON, RETICCTPCT in the last 72 hours. Urinalysis    Component Value Date/Time   COLORURINE COLORLESS (A) 11/26/2023 1840   APPEARANCEUR CLEAR 11/26/2023 1840   LABSPEC 1.009 11/26/2023 1840   PHURINE 6.0 11/26/2023 1840   GLUCOSEU NEGATIVE 11/26/2023 1840   HGBUR NEGATIVE 11/26/2023 1840   BILIRUBINUR NEGATIVE 11/26/2023 1840   KETONESUR NEGATIVE 11/26/2023 1840   PROTEINUR NEGATIVE 11/26/2023 1840   UROBILINOGEN 0.2 09/06/2009 1350   NITRITE NEGATIVE 11/26/2023 1840   LEUKOCYTESUR NEGATIVE 11/26/2023 1840   Sepsis Labs Recent Labs  Lab 11/23/23 1430 11/26/23 1830 11/27/23 0749 11/28/23 0707  WBC 1.8* 1.4* 1.4* 1.6*   Microbiology Recent Results (from the  past 240 hours)  Culture, blood (Routine x 2)     Status: None (Preliminary result)   Collection Time: 11/26/23  6:40 PM   Specimen: BLOOD  Result Value Ref Range Status   Specimen Description   Final    BLOOD RIGHT ANTECUBITAL Performed at [facility], [address], [place], [place] [address]    Special Requests   Final    BOTTLES DRAWN AEROBIC AND ANAEROBIC Blood Culture adequate volume Performed at [facility], [address], [place], [place] [address]    Culture   Final    NO GROWTH 2 DAYS Performed at [facility], [address] [place], [place] [address]    Report Status PENDING  Incomplete  Culture, blood (Routine x 2)     Status: None (Preliminary result)   Collection Time: 11/26/23  7:23 PM   Specimen: BLOOD RIGHT WRIST  Result Value Ref Range Status   Specimen Description   Final    BLOOD RIGHT WRIST Performed at [facility], [address], [place], [place] [address]    Special Requests   Final    BOTTLES DRAWN AEROBIC ONLY Blood Culture results may not be optimal due to an inadequate volume of blood received in culture bottles Performed at [facility], [address] [place], [place] [address]    Culture   Final    NO GROWTH 2 DAYS Performed at [facility], [address] [place], [place] [address]    Report Status PENDING  Incomplete     Time coordinating discharge: Over 30 minutes  SIGNED:   Elsie JAYSON Montclair, DO [facility] 11/28/2023, 4:42 PM Pager   If 7PM-7AM, please contact night-coverage [email or web address]

## 2023-11-28 NOTE — Progress Notes (Signed)
 I called and spoke with the patient to let her know we don't want her putting any cream on her perineal area, but to keep it clean and dry with plain water  rinses or sitz baths. She is aware her counts with ANC of 400 is too low for treatment today but she will come tomorrow at 12:45 pm for stat labs prior to considering resuming xrt tomorrow at 1:15 pm.     Donald KYM Husband, PAC

## 2023-11-29 ENCOUNTER — Ambulatory Visit
Admission: RE | Admit: 2023-11-29 | Discharge: 2023-11-29 | Disposition: A | Discharge status: Home / Self Care | Source: Ambulatory Visit | Attending: Radiation Oncology | Admitting: Radiation Oncology

## 2023-11-29 ENCOUNTER — Other Ambulatory Visit: Payer: Self-pay

## 2023-11-29 DIAGNOSIS — Z452 Encounter for adjustment and management of vascular access device: Secondary | ICD-10-CM | POA: Diagnosis not present

## 2023-11-29 DIAGNOSIS — C210 Malignant neoplasm of anus, unspecified: Secondary | ICD-10-CM

## 2023-11-29 DIAGNOSIS — Z510 Encounter for antineoplastic radiation therapy: Secondary | ICD-10-CM | POA: Diagnosis present

## 2023-11-29 DIAGNOSIS — Z5111 Encounter for antineoplastic chemotherapy: Secondary | ICD-10-CM | POA: Diagnosis not present

## 2023-11-29 DIAGNOSIS — Z85828 Personal history of other malignant neoplasm of skin: Secondary | ICD-10-CM | POA: Diagnosis not present

## 2023-11-29 LAB — RAD ONC ARIA SESSION SUMMARY
Course Elapsed Days: 17
Plan Fractions Treated to Date: 12
Plan Prescribed Dose Per Fraction: 1.8 Gy
Plan Total Fractions Prescribed: 28
Plan Total Prescribed Dose: 50.4 Gy
Reference Point Dosage Given to Date: 21.6 Gy
Reference Point Session Dosage Given: 1.8 Gy
Session Number: 12

## 2023-11-29 LAB — CBC WITH DIFFERENTIAL (CANCER CENTER ONLY)
Abs Immature Granulocytes: 0.02 K/uL (ref 0.00–0.07)
Basophils Absolute: 0 K/uL (ref 0.0–0.1)
Basophils Relative: 0 %
Eosinophils Absolute: 0 K/uL (ref 0.0–0.5)
Eosinophils Relative: 1 %
HCT: 31.1 % — ABNORMAL LOW (ref 36.0–46.0)
Hemoglobin: 10.8 g/dL — ABNORMAL LOW (ref 12.0–15.0)
Immature Granulocytes: 1 %
Lymphocytes Relative: 20 %
Lymphs Abs: 0.6 K/uL — ABNORMAL LOW (ref 0.7–4.0)
MCH: 28.5 pg (ref 26.0–34.0)
MCHC: 34.7 g/dL (ref 30.0–36.0)
MCV: 82.1 fL (ref 80.0–100.0)
Monocytes Absolute: 0.6 K/uL (ref 0.1–1.0)
Monocytes Relative: 21 %
Neutro Abs: 1.6 K/uL — ABNORMAL LOW (ref 1.7–7.7)
Neutrophils Relative %: 57 %
Platelet Count: 137 K/uL — ABNORMAL LOW (ref 150–400)
RBC: 3.79 MIL/uL — ABNORMAL LOW (ref 3.87–5.11)
RDW: 16.7 % — ABNORMAL HIGH (ref 11.5–15.5)
WBC Count: 2.8 K/uL — ABNORMAL LOW (ref 4.0–10.5)
nRBC: 0 % (ref 0.0–0.2)

## 2023-11-29 NOTE — Progress Notes (Signed)
 TCM NURSING DOCUMENTATION              TCM Requirements for Post-Discharge Contact Deadlines:  Discharge Date:: 11/28/23 7 calendar days post-discharge:: 12/05/2023 14 calendar days post-discharge:: 12/12/2023    Patient Name: Erin [name] Patient DOB: Sep 30, 1950 Patient Current Location: Home  Discharge diagnoses:  Primary discharge diagnosis:: Neutropenic fever  MEDICATION REVIEW     APPOINTMENTS     SELF-MANAGEMENT      PATIENT TEACHING   Pt answered and after RN related the reason for call. She politely said she did not need anything and thanked me and ended call.

## 2023-11-30 ENCOUNTER — Ambulatory Visit
Admission: RE | Admit: 2023-11-30 | Discharge: 2023-11-30 | Disposition: A | Discharge status: Home / Self Care | Source: Ambulatory Visit | Attending: Radiation Oncology | Admitting: Radiation Oncology

## 2023-11-30 ENCOUNTER — Inpatient Hospital Stay

## 2023-11-30 ENCOUNTER — Other Ambulatory Visit: Payer: Self-pay

## 2023-11-30 ENCOUNTER — Encounter: Payer: Self-pay | Admitting: Nurse Practitioner

## 2023-11-30 ENCOUNTER — Inpatient Hospital Stay: Admitting: Nurse Practitioner

## 2023-11-30 VITALS — BP 123/65 | HR 100 | Temp 97.6°F | Resp 18 | Ht 69.0 in | Wt 223.1 lb

## 2023-11-30 DIAGNOSIS — Z510 Encounter for antineoplastic radiation therapy: Secondary | ICD-10-CM | POA: Diagnosis not present

## 2023-11-30 DIAGNOSIS — C210 Malignant neoplasm of anus, unspecified: Secondary | ICD-10-CM

## 2023-11-30 LAB — CBC WITH DIFFERENTIAL (CANCER CENTER ONLY)
Abs Immature Granulocytes: 0.03 K/uL (ref 0.00–0.07)
Basophils Absolute: 0 K/uL (ref 0.0–0.1)
Basophils Relative: 1 %
Eosinophils Absolute: 0 K/uL (ref 0.0–0.5)
Eosinophils Relative: 1 %
HCT: 32.8 % — ABNORMAL LOW (ref 36.0–46.0)
Hemoglobin: 10.8 g/dL — ABNORMAL LOW (ref 12.0–15.0)
Immature Granulocytes: 1 %
Lymphocytes Relative: 20 %
Lymphs Abs: 0.6 K/uL — ABNORMAL LOW (ref 0.7–4.0)
MCH: 28.3 pg (ref 26.0–34.0)
MCHC: 32.9 g/dL (ref 30.0–36.0)
MCV: 85.9 fL (ref 80.0–100.0)
Monocytes Absolute: 0.5 K/uL (ref 0.1–1.0)
Monocytes Relative: 18 %
Neutro Abs: 1.7 K/uL (ref 1.7–7.7)
Neutrophils Relative %: 59 %
Platelet Count: 155 K/uL (ref 150–400)
RBC: 3.82 MIL/uL — ABNORMAL LOW (ref 3.87–5.11)
RDW: 17.1 % — ABNORMAL HIGH (ref 11.5–15.5)
WBC Count: 2.9 K/uL — ABNORMAL LOW (ref 4.0–10.5)
nRBC: 0 % (ref 0.0–0.2)

## 2023-11-30 LAB — RAD ONC ARIA SESSION SUMMARY
Course Elapsed Days: 18
Plan Fractions Treated to Date: 13
Plan Prescribed Dose Per Fraction: 1.8 Gy
Plan Total Fractions Prescribed: 28
Plan Total Prescribed Dose: 50.4 Gy
Reference Point Dosage Given to Date: 23.4 Gy
Reference Point Session Dosage Given: 1.8 Gy
Session Number: 13

## 2023-11-30 NOTE — Progress Notes (Signed)
  [facility] OFFICE PROGRESS NOTE   Diagnosis: Anal cancer  INTERVAL HISTORY:   [name] returns for follow-up.  She completed cycle one 5-FU/Mitomycin-C 11/12/2023.  She continues radiation.  She was hospitalized with febrile neutropenia 11/26/2023.  Temperature was 102 on admission.  She had no further fever.  The ANC had improved to 0.4 11/28/2023.  Blood cultures negative.  She was discharged home on oral antibiotics.  Repeat CBC yesterday showed improvement of the ANC to 1.6.  Overall she feels well.  No fever or chills since hospital discharge.  No cough or shortness of breath.  She has periodic loose stools.  She takes Imodium if needed.  She denies abdominal pain.  She notes perianal pruritus and mild discomfort.  Objective:  Vital signs in last 24 hours:  Blood pressure 123/65, pulse 100, temperature 97.6 F (36.4 C), temperature source Temporal, resp. rate 18, height 5' 9 (1.753 m), weight 223 lb 1.6 oz (101.2 kg), SpO2 98%.    HEENT: No thrush or ulcers. Resp: Faint wheezes at the upper lung fields.  No respiratory distress. Cardio: Regular rate and rhythm. GI: No hepatosplenomegaly. Vascular: No leg edema. Skin: Perianal skin is erythematous with areas of superficial ulceration.  Bilateral groin region erythematous.   Lab Results:  Lab Results  Component Value Date   WBC 2.9 (L) 11/30/2023   HGB 10.8 (L) 11/30/2023   HCT 32.8 (L) 11/30/2023   MCV 85.9 11/30/2023   PLT 155 11/30/2023   NEUTROABS 1.7 11/30/2023    Imaging:  No results found.  Medications: I have reviewed the patient's current medications.  Assessment/Plan: Anal cancer, stage IIa (cT2, cN0) 09/27/2023 colonoscopy: Mass at the dentate line, 3.5 cm, biopsy-moderately differentiated squamous cell carcinoma, p16 positive, Ki-67 increased 08/20/2023-CT chest lung cancer screening-unchanged 4 mm subpleural nodule right middle lobe, no new nodules 10/08/2023-CT abdomen/pelvis-no  evidence of metastatic disease, right hepatic cyst, shotty retroperitoneal and pelvic adenopathy-no pathologic adenopathy PET scan 11/02/2023-hypermetabolic soft tissue prominence at the anorectal junction.  No evidence of metastatic disease. Radiation 11/12/2023  Cycle 1 5-FU/Mitomycin-C 11/12/2023 G2, P2 Basal cell carcinoma of the nose Hyperlipidemia Anxiety/depression Alcoholism Tobacco use Admission 11/26/2023 with fever and neutropenia, blood cultures negative, discharged home 11/28/2023 on oral antibiotics  Disposition: Erin [name] appears stable.  She completed cycle one 5-FU/Mitomycin-C 11/12/2023.  She continues radiation.  She was recently hospitalized with febrile neutropenia.  I reviewed the blood cultures which have remained negative.  We reviewed the CBC from today.  The neutrophil count continues to improve, now in low normal range.  She appears well.  She will return for follow-up prior to cycle two 5-FU/Mitomycin-C 12/10/2023.  We are available to see her sooner if needed.    Olam Ned ANP/GNP-BC   11/30/2023  9:50 AM

## 2023-12-01 LAB — CULTURE, BLOOD (ROUTINE X 2)
Culture: NO GROWTH
Culture: NO GROWTH
Special Requests: ADEQUATE

## 2023-12-03 ENCOUNTER — Other Ambulatory Visit: Payer: Self-pay

## 2023-12-03 ENCOUNTER — Ambulatory Visit
Admission: RE | Admit: 2023-12-03 | Discharge: 2023-12-03 | Disposition: A | Source: Ambulatory Visit | Attending: Radiation Oncology

## 2023-12-03 DIAGNOSIS — Z510 Encounter for antineoplastic radiation therapy: Secondary | ICD-10-CM | POA: Diagnosis not present

## 2023-12-03 LAB — RAD ONC ARIA SESSION SUMMARY
Course Elapsed Days: 21
Plan Fractions Treated to Date: 14
Plan Prescribed Dose Per Fraction: 1.8 Gy
Plan Total Fractions Prescribed: 28
Plan Total Prescribed Dose: 50.4 Gy
Reference Point Dosage Given to Date: 25.2 Gy
Reference Point Session Dosage Given: 1.8 Gy
Session Number: 14

## 2023-12-04 ENCOUNTER — Ambulatory Visit
Admission: RE | Admit: 2023-12-04 | Discharge: 2023-12-04 | Disposition: A | Source: Ambulatory Visit | Attending: Radiation Oncology

## 2023-12-04 ENCOUNTER — Telehealth: Payer: Self-pay | Admitting: Radiation Oncology

## 2023-12-04 ENCOUNTER — Other Ambulatory Visit: Payer: Self-pay

## 2023-12-04 ENCOUNTER — Other Ambulatory Visit: Payer: Self-pay | Admitting: Oncology

## 2023-12-04 DIAGNOSIS — Z510 Encounter for antineoplastic radiation therapy: Secondary | ICD-10-CM | POA: Diagnosis not present

## 2023-12-04 LAB — RAD ONC ARIA SESSION SUMMARY
Course Elapsed Days: 22
Plan Fractions Treated to Date: 15
Plan Prescribed Dose Per Fraction: 1.8 Gy
Plan Total Fractions Prescribed: 28
Plan Total Prescribed Dose: 50.4 Gy
Reference Point Dosage Given to Date: 27 Gy
Reference Point Session Dosage Given: 1.8 Gy
Session Number: 15

## 2023-12-04 NOTE — Telephone Encounter (Signed)
 10/21 Received voicemail from patient with concerns about what kind of cream is good to use after her treatments.  Email forward to nursing, so they are aware.

## 2023-12-05 ENCOUNTER — Ambulatory Visit
Admission: RE | Admit: 2023-12-05 | Discharge: 2023-12-05 | Disposition: A | Discharge status: Home / Self Care | Source: Ambulatory Visit | Attending: Radiation Oncology | Admitting: Radiation Oncology

## 2023-12-05 ENCOUNTER — Other Ambulatory Visit: Payer: Self-pay

## 2023-12-05 DIAGNOSIS — Z510 Encounter for antineoplastic radiation therapy: Secondary | ICD-10-CM | POA: Diagnosis not present

## 2023-12-05 LAB — RAD ONC ARIA SESSION SUMMARY
Course Elapsed Days: 23
Plan Fractions Treated to Date: 16
Plan Prescribed Dose Per Fraction: 1.8 Gy
Plan Total Fractions Prescribed: 28
Plan Total Prescribed Dose: 50.4 Gy
Reference Point Dosage Given to Date: 28.8 Gy
Reference Point Session Dosage Given: 1.8 Gy
Session Number: 16

## 2023-12-06 ENCOUNTER — Ambulatory Visit
Admission: RE | Admit: 2023-12-06 | Discharge: 2023-12-06 | Disposition: A | Discharge status: Home / Self Care | Source: Ambulatory Visit | Attending: Radiation Oncology | Admitting: Radiation Oncology

## 2023-12-06 ENCOUNTER — Other Ambulatory Visit: Payer: Self-pay | Admitting: Oncology

## 2023-12-06 ENCOUNTER — Other Ambulatory Visit: Payer: Self-pay

## 2023-12-06 ENCOUNTER — Telehealth: Payer: Self-pay

## 2023-12-06 DIAGNOSIS — Z510 Encounter for antineoplastic radiation therapy: Secondary | ICD-10-CM | POA: Diagnosis not present

## 2023-12-06 LAB — RAD ONC ARIA SESSION SUMMARY
Course Elapsed Days: 24
Plan Fractions Treated to Date: 17
Plan Prescribed Dose Per Fraction: 1.8 Gy
Plan Total Fractions Prescribed: 28
Plan Total Prescribed Dose: 50.4 Gy
Reference Point Dosage Given to Date: 30.6 Gy
Reference Point Session Dosage Given: 1.8 Gy
Session Number: 17

## 2023-12-06 MED ORDER — HYDROCODONE-ACETAMINOPHEN 5-325 MG PO TABS: 1.0000 | ORAL_TABLET | Freq: Four times a day (QID) | ORAL | 0 refills | Status: DC | PRN

## 2023-12-06 NOTE — Telephone Encounter (Signed)
 The patient contacted us  to report a burning sensation in the genital area and is requesting pain management assistance.

## 2023-12-07 ENCOUNTER — Ambulatory Visit (HOSPITAL_COMMUNITY)
Admission: RE | Admit: 2023-12-07 | Discharge: 2023-12-07 | Disposition: A | Discharge status: Home / Self Care | Source: Ambulatory Visit | Attending: Oncology | Admitting: Oncology

## 2023-12-07 ENCOUNTER — Ambulatory Visit
Admission: RE | Admit: 2023-12-07 | Discharge: 2023-12-07 | Disposition: A | Discharge status: Home / Self Care | Source: Ambulatory Visit | Attending: Radiation Oncology | Admitting: Radiation Oncology

## 2023-12-07 ENCOUNTER — Other Ambulatory Visit: Payer: Self-pay

## 2023-12-07 ENCOUNTER — Other Ambulatory Visit: Payer: Self-pay | Admitting: Oncology

## 2023-12-07 DIAGNOSIS — C210 Malignant neoplasm of anus, unspecified: Secondary | ICD-10-CM | POA: Insufficient documentation

## 2023-12-07 DIAGNOSIS — Z510 Encounter for antineoplastic radiation therapy: Secondary | ICD-10-CM | POA: Diagnosis not present

## 2023-12-07 LAB — RAD ONC ARIA SESSION SUMMARY
Course Elapsed Days: 25
Plan Fractions Treated to Date: 18
Plan Prescribed Dose Per Fraction: 1.8 Gy
Plan Total Fractions Prescribed: 28
Plan Total Prescribed Dose: 50.4 Gy
Reference Point Dosage Given to Date: 32.4 Gy
Reference Point Session Dosage Given: 1.8 Gy
Session Number: 18

## 2023-12-07 MED ORDER — HEPARIN SOD (PORK) LOCK FLUSH 100 UNIT/ML IV SOLN
500.0000 [IU] | Freq: Once | INTRAVENOUS | Status: AC
  Administered 2023-12-07: 500 [IU] via INTRAVENOUS

## 2023-12-07 MED ORDER — LIDOCAINE HCL 1 % IJ SOLN
INTRAMUSCULAR | Status: AC
  Filled 2023-12-07: qty 20

## 2023-12-07 MED ORDER — HEPARIN SOD (PORK) LOCK FLUSH 100 UNIT/ML IV SOLN
INTRAVENOUS | Status: AC
  Filled 2023-12-07: qty 5

## 2023-12-07 MED ORDER — LIDOCAINE HCL 1 % IJ SOLN
20.0000 mL | Freq: Once | INTRAMUSCULAR | Status: AC
  Administered 2023-12-07: 2 mL via INTRADERMAL

## 2023-12-07 NOTE — Procedures (Signed)
 Left double-lumen brachial vein PICC placed.  Length 52 cm.  Tip SVC/right atrial junction.  No immediate complications.  Okay to use.  Medication used- 1% lidocaine  with epinephrine to skin and subcutaneous tissue.EBL < 2 cc.

## 2023-12-10 ENCOUNTER — Inpatient Hospital Stay

## 2023-12-10 ENCOUNTER — Ambulatory Visit
Admission: RE | Admit: 2023-12-10 | Discharge: 2023-12-10 | Disposition: A | Discharge status: Home / Self Care | Source: Ambulatory Visit | Attending: Radiation Oncology | Admitting: Radiation Oncology

## 2023-12-10 ENCOUNTER — Encounter: Payer: Self-pay | Admitting: Oncology

## 2023-12-10 ENCOUNTER — Inpatient Hospital Stay (HOSPITAL_BASED_OUTPATIENT_CLINIC_OR_DEPARTMENT_OTHER): Admitting: Nurse Practitioner

## 2023-12-10 ENCOUNTER — Inpatient Hospital Stay: Admitting: Nutrition

## 2023-12-10 ENCOUNTER — Ambulatory Visit: Admitting: Nurse Practitioner

## 2023-12-10 ENCOUNTER — Other Ambulatory Visit: Payer: Self-pay

## 2023-12-10 ENCOUNTER — Encounter: Payer: Self-pay | Admitting: Nurse Practitioner

## 2023-12-10 ENCOUNTER — Ambulatory Visit: Admitting: Oncology

## 2023-12-10 VITALS — BP 135/72 | HR 98 | Temp 97.6°F | Resp 18 | Ht 69.0 in | Wt 231.7 lb

## 2023-12-10 DIAGNOSIS — C210 Malignant neoplasm of anus, unspecified: Secondary | ICD-10-CM

## 2023-12-10 DIAGNOSIS — Z510 Encounter for antineoplastic radiation therapy: Secondary | ICD-10-CM | POA: Diagnosis not present

## 2023-12-10 LAB — CBC WITH DIFFERENTIAL (CANCER CENTER ONLY)
Abs Immature Granulocytes: 0.01 K/uL (ref 0.00–0.07)
Basophils Absolute: 0 K/uL (ref 0.0–0.1)
Basophils Relative: 0 %
Eosinophils Absolute: 0.4 K/uL (ref 0.0–0.5)
Eosinophils Relative: 10 %
HCT: 29.9 % — ABNORMAL LOW (ref 36.0–46.0)
Hemoglobin: 9.8 g/dL — ABNORMAL LOW (ref 12.0–15.0)
Immature Granulocytes: 0 %
Lymphocytes Relative: 9 %
Lymphs Abs: 0.4 K/uL — ABNORMAL LOW (ref 0.7–4.0)
MCH: 28.5 pg (ref 26.0–34.0)
MCHC: 32.8 g/dL (ref 30.0–36.0)
MCV: 86.9 fL (ref 80.0–100.0)
Monocytes Absolute: 0.5 K/uL (ref 0.1–1.0)
Monocytes Relative: 13 %
Neutro Abs: 3 K/uL (ref 1.7–7.7)
Neutrophils Relative %: 68 %
Platelet Count: 257 K/uL (ref 150–400)
RBC: 3.44 MIL/uL — ABNORMAL LOW (ref 3.87–5.11)
RDW: 18.4 % — ABNORMAL HIGH (ref 11.5–15.5)
WBC Count: 4.3 K/uL (ref 4.0–10.5)
nRBC: 0 % (ref 0.0–0.2)

## 2023-12-10 LAB — CMP (CANCER CENTER ONLY)
ALT: 18 U/L (ref 0–44)
AST: 15 U/L (ref 15–41)
Albumin: 3.5 g/dL (ref 3.5–5.0)
Alkaline Phosphatase: 102 U/L (ref 38–126)
Anion gap: 9 (ref 5–15)
BUN: 9 mg/dL (ref 8–23)
CO2: 25 mmol/L (ref 22–32)
Calcium: 9.3 mg/dL (ref 8.9–10.3)
Chloride: 105 mmol/L (ref 98–111)
Creatinine: 0.69 mg/dL (ref 0.44–1.00)
GFR, Estimated: >60 mL/min (ref >60)
Glucose, Bld: 118 mg/dL — ABNORMAL HIGH (ref 70–99)
Potassium: 3.9 mmol/L (ref 3.5–5.1)
Sodium: 138 mmol/L (ref 135–145)
Total Bilirubin: 0.2 mg/dL (ref 0.0–1.2)
Total Protein: 5.9 g/dL — ABNORMAL LOW (ref 6.5–8.1)

## 2023-12-10 LAB — RAD ONC ARIA SESSION SUMMARY
Course Elapsed Days: 28
Plan Fractions Treated to Date: 19
Plan Prescribed Dose Per Fraction: 1.8 Gy
Plan Total Fractions Prescribed: 28
Plan Total Prescribed Dose: 50.4 Gy
Reference Point Dosage Given to Date: 34.2 Gy
Reference Point Session Dosage Given: 1.8 Gy
Session Number: 19

## 2023-12-10 MED ORDER — SODIUM CHLORIDE 0.9 % IV SOLN
700.0000 mg/m2/d | INTRAVENOUS | Status: DC
  Administered 2023-12-10: 6200 mg via INTRAVENOUS
  Filled 2023-12-10: qty 100

## 2023-12-10 MED ORDER — PROCHLORPERAZINE MALEATE 10 MG PO TABS
10.0000 mg | ORAL_TABLET | Freq: Once | ORAL | Status: AC
  Administered 2023-12-10: 10 mg via ORAL
  Filled 2023-12-10: qty 1

## 2023-12-10 MED ORDER — SODIUM CHLORIDE 0.9 % IV SOLN: INTRAVENOUS | Status: DC

## 2023-12-10 MED ORDER — MITOMYCIN CHEMO IV INJECTION 20 MG
6.5000 mg/m2 | Freq: Once | INTRAVENOUS | Status: AC
  Administered 2023-12-10: 14.5 mg via INTRAVENOUS
  Filled 2023-12-10: qty 29

## 2023-12-10 NOTE — Progress Notes (Signed)
 Nutrition follow up completed with patient during infusion for Anal Cancer. She will complete final radiation treatment on Nov 7. She is followed by Dr. Cloretta and Dr. Dewey.  Weight:  231 pounds 11.2 oz Oct 27 224 pounds 14.4 oz Sept 29  S/p PICC line. Scheduled for Mitomycin and 5 FU. Weight improved. Reports some diarrhea which improves with Lomotil. Believes it is controlled now. Denies nausea and vomiting. No questions or concerns.  Nutrition Diagnosis: Food and Nutrition Related Knowledge Deficit resolved  Intervention: Continue small frequent meals and snacks. Low fiber diet to help with diarrhea.  Monitoring, Evaluation, Goals: Tolerate adequate calories and protein to minimize loss of lean body mass.  Next Visit: Follow up as needed.

## 2023-12-10 NOTE — Progress Notes (Signed)
Patient seen by Lonna Cobb NP today  Vitals are within treatment parameters:Yes   Labs are within treatment parameters: Yes   Treatment plan has been signed: Yes   Per physician team, Patient is ready for treatment.

## 2023-12-10 NOTE — Progress Notes (Signed)
  [name] OFFICE PROGRESS NOTE   Diagnosis: Anal cancer  INTERVAL HISTORY:   [name] returns as scheduled.  She continues radiation.  She completed cycle one 5-FU/Mitomycin-C 11/12/2023.  Overall feel well.  No recent nausea/vomiting.  No mouth sores.  Intermittent loose stools.  She takes Imodium if needed.  She also has Lomotil.  Overall good appetite.  Good fluid intake.  Objective:  Vital signs in last 24 hours:  Blood pressure 135/72, pulse 98, temperature 97.6 F (36.4 C), temperature source Temporal, resp. rate 18, height 5' 9 (1.753 m), weight 231 lb 11.2 oz (105.1 kg), SpO2 99%.    HEENT: No thrush or ulcers. Resp: Lungs clear bilaterally. Cardio: Regular rate and rhythm. GI: No hepatosplenomegaly. Vascular: No leg edema. Skin: Perianal skin with mild erythema, no significant skin breakdown.  Bilateral groin  erythematous, no skin breakdown.  Labia with erythema, 3 discrete superficial ulcers along the right labia. PICC site without erythema.  Lab Results:  Lab Results  Component Value Date   WBC 4.3 12/10/2023   HGB 9.8 (L) 12/10/2023   HCT 29.9 (L) 12/10/2023   MCV 86.9 12/10/2023   PLT 257 12/10/2023   NEUTROABS 3.0 12/10/2023    Imaging:  No results found.  Medications: I have reviewed the patient's current medications.  Assessment/Plan: Anal cancer, stage IIa (cT2, cN0) 09/27/2023 colonoscopy: Mass at the dentate line, 3.5 cm, biopsy-moderately differentiated squamous cell carcinoma, p16 positive, Ki-67 increased 08/20/2023-CT chest lung cancer screening-unchanged 4 mm subpleural nodule right middle lobe, no new nodules 10/08/2023-CT abdomen/pelvis-no evidence of metastatic disease, right hepatic cyst, shotty retroperitoneal and pelvic adenopathy-no pathologic adenopathy PET scan 11/02/2023-hypermetabolic soft tissue prominence at the anorectal junction.  No evidence of metastatic disease. Radiation 11/12/2023  Cycle 1  5-FU/Mitomycin-C 11/12/2023 Cycle 2 5-FU/Mitomycin-C 12/10/2023 G2, P2 Basal cell carcinoma of the nose Hyperlipidemia Anxiety/depression Alcoholism Tobacco use Admission 11/26/2023 with fever and neutropenia, blood cultures negative, discharged home 11/28/2023 on oral antibiotics  Disposition: [name] appears stable.  She has completed 1 cycle of 5-FU/Mitomycin-C.  Postchemotherapy course complicated by hospitalization for febrile neutropenia.  CBC from today reviewed.  Counts have recovered.  Plan to proceed with cycle 2 today as scheduled.  Chemotherapy dose reductions in place.  Repeat CBC 12/17/2023.  She is scheduled to complete the planned course of radiation 12/21/2023.  We will see her in follow-up around 12/27/2023.  She will contact the office in the interim with any problems.    Olam Ned ANP/GNP-BC   12/10/2023  11:55 AM

## 2023-12-10 NOTE — Patient Instructions (Signed)
 [facility] - A DEPT OF [place]  Discharge Instructions: Thank you for choosing [facility] to provide your oncology and hematology care.   If you have a lab appointment with the Cancer Center, please go directly to the Cancer Center and check in at the registration area.   Wear comfortable clothing and clothing appropriate for easy access to any Portacath or PICC line.   We strive to give you quality time with your provider. You may need to reschedule your appointment if you arrive late (15 or more minutes).  Arriving late affects you and other patients whose appointments are after yours.  Also, if you miss three or more appointments without notifying the office, you may be dismissed from the clinic at the provider's discretion.      For prescription refill requests, have your pharmacy contact our office and allow 72 hours for refills to be completed.    Today you received the following chemotherapy and/or immunotherapy agents: mutamycin, fluorouracil       To help prevent nausea and vomiting after your treatment, we encourage you to take your nausea medication as directed.  BELOW ARE SYMPTOMS THAT SHOULD BE REPORTED IMMEDIATELY: *FEVER GREATER THAN 100.4 F (38 C) OR HIGHER *CHILLS OR SWEATING *NAUSEA AND VOMITING THAT IS NOT CONTROLLED WITH YOUR NAUSEA MEDICATION *UNUSUAL SHORTNESS OF BREATH *UNUSUAL BRUISING OR BLEEDING *URINARY PROBLEMS (pain or burning when urinating, or frequent urination) *BOWEL PROBLEMS (unusual diarrhea, constipation, pain near the anus) TENDERNESS IN MOUTH AND THROAT WITH OR WITHOUT PRESENCE OF ULCERS (sore throat, sores in mouth, or a toothache) UNUSUAL RASH, SWELLING OR PAIN  UNUSUAL VAGINAL DISCHARGE OR ITCHING   Items with * indicate a potential emergency and should be followed up as soon as possible or go to the Emergency Department if any problems should occur.  Please show the CHEMOTHERAPY ALERT CARD or  IMMUNOTHERAPY ALERT CARD at check-in to the Emergency Department and triage nurse.  Should you have questions after your visit or need to cancel or reschedule your appointment, please contact [facility] - A DEPT OF [facility]  Dept: [phone]  and follow the prompts.  Office hours are 8:00 a.m. to 4:30 p.m. Monday - Friday. Please note that voicemails left after 4:00 p.m. may not be returned until the following business day.  We are closed weekends and major holidays. You have access to a nurse at all times for urgent questions. Please call the main number to the clinic Dept: [phone] and follow the prompts.   For any non-urgent questions, you may also contact your provider using MyChart. We now offer e-Visits for anyone 62 and older to request care online for non-urgent symptoms. For details visit [email or web address].   Also download the MyChart app! Go to the app store, search MyChart, open the app, select [place], and log in with your MyChart username and password.

## 2023-12-10 NOTE — Patient Instructions (Signed)
 PICC Home Care Guide A peripherally inserted central catheter (PICC) is a form of IV access that allows medicines and IV fluids to be quickly put into the blood and spread throughout the body. The PICC is a long, thin, flexible tube (catheter) that is put into a vein in a person's arm or leg. The catheter ends in a large vein just outside the heart called the superior vena cava (SVC). After the PICC is put in, a chest X-ray may be done to make sure that it is in the right place. A PICC may be placed for different reasons, such as: To give medicines and liquid nutrition. To give IV fluids and blood products. To take blood samples often. If there is trouble placing a peripheral intravenous (PIV) catheter. If cared for properly, a PICC can remain in place for many months. Having a PICC can allow you to go home from the hospital sooner and continue treatment at home. Medicines and PICC care can be managed at home by a family member, caregiver, or home health care team. What are the risks? Generally, having a PICC is safe. However, problems may occur, including: A blood clot (thrombus) forming in or at the end of the PICC. A blood clot forming in a vein (deep vein thrombosis) or traveling to the lung (pulmonary embolism). Inflammation of the vein (phlebitis) in which the PICC is placed. Infection at the insertion site or in the blood. Blood infections from central lines, like PICCs, can be serious and often require a hospital stay. PICC malposition, or PICC movement or poor placement. A break or cut in the PICC. Do not use scissors near the PICC. Nerve or tendon irritation or injury during PICC insertion. How to care for your PICC Please follow the specific guidelines provided by your health care provider. Preventing infection You and any caregivers should wash your hands often with soap and water for at least 20 seconds. Wash hands: Before touching the PICC or the infusion device. Before changing a  bandage (dressing). Do not change the dressing unless you have been taught to do so and have shown you are able to change it safely. Flush the PICC as told. Tell your health care provider right away if the PICC is hard to flush or does not flush. Do not use force to flush the PICC. Use clean and germ-free (sterile) supplies only. Keep the supplies in a dry place. Do not reuse needles, syringes, or any other supplies. Reusing supplies can lead to infection. Keep the PICC dressing dry and secure it with tape if the edges stop sticking to your skin. Check your PICC insertion site every day for signs of infection. Check for: Redness, swelling, or pain. Fluid or blood. Warmth. Pus or a bad smell. Preventing other problems Do not use a syringe that is less than 10 mL to flush the PICC. Do not have your blood pressure checked on the arm in which the PICC is placed. Do not ever pull or tug on the PICC. Keep it secured to your arm with tape or a stretch wrap when not in use. Do not take the PICC out yourself. Only a trained health care provider should remove the PICC. Keep pets and children away from your PICC. How to care for your PICC dressing Keep your PICC dressing clean and dry to prevent infection. Do not take baths, swim, or use a hot tub until your health care provider approves. Ask your health care provider if you can take  showers. You may only be allowed to take sponge baths. When you are allowed to shower: Ask your health care provider to teach you how to wrap the PICC. Cover the PICC with clear plastic wrap and tape to keep it dry while showering. Follow instructions from your health care provider about how to take care of your insertion site and dressing. Make sure you: Wash your hands with soap and water for at least 20 seconds before and after you change your dressing. If soap and water are not available, use hand sanitizer. Change your dressing only if taught to do so by your health care  provider. Your PICC dressing needs to be changed if it becomes loose or wet. Leave stitches (sutures), skin glue, or adhesive strips in place. These skin closures may need to stay in place for 2 weeks or longer. If adhesive strip edges start to loosen and curl up, you may trim the loose edges. Do not remove adhesive strips completely unless your health care provider tells you to do that. Follow these instructions at home: Disposal of supplies Throw away any syringes in a disposal container that is meant for sharp items (sharps container). You can buy a sharps container from a pharmacy, or you can make one by using an empty, hard plastic bottle with a lid. Place any used dressings or infusion bags into a plastic bag. Throw that bag in the trash. General instructions  Always carry your PICC identification card or wear a medical alert bracelet. Keep the tube clamped at all times, unless it is being used. Always carry a smooth-edge clamp with you to clamp the PICC if it breaks. Do not use scissors or sharp objects near the tube. You may bend your arm and move it freely. If your PICC is near or at the bend of your elbow, avoid activity with repeated motion at the elbow. Avoid lifting heavy objects as told by your health care provider. Keep all follow-up visits. This is important. You will need to have your PICC dressing changed at least once a week. Contact a health care provider if: You have pain in your arm, ear, face, or teeth. You have a fever or chills. You have redness, swelling, or pain around the insertion site. You have fluid or blood coming from the insertion site. Your insertion site feels warm to the touch. You have pus or a bad smell coming from the insertion site. Your skin feels hard and raised around the insertion site. Your PICC dressing has gotten wet or is coming off and you have not been taught how to change it. Get help right away if: You have problems with your PICC, such as  your PICC: Was tugged or pulled and has partially come out. Do not  push the PICC back in. Cannot be flushed, is hard to flush, or leaks around the insertion site when it is flushed. Makes a flushing sound when it is flushed. Appears to have a hole or tear. Is accidentally pulled all the way out. If this happens, cover the insertion site with a gauze dressing. Do not throw the PICC away. Your health care provider will need to check it to be sure the entire catheter came out. You feel your heart racing or skipping beats, or you have chest pain. You have shortness of breath or trouble breathing. You have swelling, redness, warmth, or pain in the arm in which the PICC is placed. You have a red streak going up your arm that  starts under the PICC dressing. These symptoms may be an emergency. Get help right away. Call 911. Do not wait to see if the symptoms will go away. Do not drive yourself to the hospital. Summary A peripherally inserted central catheter (PICC) is a long, thin, flexible tube (catheter) that is put into a vein in the arm or leg. If cared for properly, a PICC can remain in place for many months. Having a PICC can allow you to go home from the hospital sooner and continue treatment at home. The PICC is inserted using a germ-free (sterile) technique by a specially trained health care provider. Only a trained health care provider should remove it. Do not have your blood pressure checked on the arm in which your PICC is placed. Always keep your PICC identification card with you. This information is not intended to replace advice given to you by your health care provider. Make sure you discuss any questions you have with your health care provider. Document Revised: 08/18/2020 Document Reviewed: 08/18/2020 Elsevier Patient Education  2024 [employer].

## 2023-12-11 ENCOUNTER — Other Ambulatory Visit: Payer: Self-pay

## 2023-12-11 ENCOUNTER — Ambulatory Visit
Admission: RE | Admit: 2023-12-11 | Discharge: 2023-12-11 | Disposition: A | Source: Ambulatory Visit | Attending: Radiation Oncology

## 2023-12-11 DIAGNOSIS — Z510 Encounter for antineoplastic radiation therapy: Secondary | ICD-10-CM | POA: Diagnosis not present

## 2023-12-11 LAB — RAD ONC ARIA SESSION SUMMARY
Course Elapsed Days: 29
Plan Fractions Treated to Date: 20
Plan Prescribed Dose Per Fraction: 1.8 Gy
Plan Total Fractions Prescribed: 28
Plan Total Prescribed Dose: 50.4 Gy
Reference Point Dosage Given to Date: 36 Gy
Reference Point Session Dosage Given: 1.8 Gy
Session Number: 20

## 2023-12-12 ENCOUNTER — Ambulatory Visit
Admission: RE | Admit: 2023-12-12 | Discharge: 2023-12-12 | Disposition: A | Discharge status: Home / Self Care | Source: Ambulatory Visit | Attending: Radiation Oncology | Admitting: Radiation Oncology

## 2023-12-12 ENCOUNTER — Other Ambulatory Visit: Payer: Self-pay

## 2023-12-12 DIAGNOSIS — Z510 Encounter for antineoplastic radiation therapy: Secondary | ICD-10-CM | POA: Diagnosis not present

## 2023-12-12 LAB — RAD ONC ARIA SESSION SUMMARY
Course Elapsed Days: 30
Plan Fractions Treated to Date: 21
Plan Prescribed Dose Per Fraction: 1.8 Gy
Plan Total Fractions Prescribed: 28
Plan Total Prescribed Dose: 50.4 Gy
Reference Point Dosage Given to Date: 37.8 Gy
Reference Point Session Dosage Given: 1.8 Gy
Session Number: 21

## 2023-12-13 ENCOUNTER — Other Ambulatory Visit: Payer: Self-pay

## 2023-12-13 ENCOUNTER — Ambulatory Visit
Admission: RE | Admit: 2023-12-13 | Discharge: 2023-12-13 | Disposition: A | Source: Ambulatory Visit | Attending: Radiation Oncology

## 2023-12-13 DIAGNOSIS — Z510 Encounter for antineoplastic radiation therapy: Secondary | ICD-10-CM | POA: Diagnosis not present

## 2023-12-13 LAB — RAD ONC ARIA SESSION SUMMARY
Course Elapsed Days: 31
Plan Fractions Treated to Date: 22
Plan Prescribed Dose Per Fraction: 1.8 Gy
Plan Total Fractions Prescribed: 28
Plan Total Prescribed Dose: 50.4 Gy
Reference Point Dosage Given to Date: 39.6 Gy
Reference Point Session Dosage Given: 1.8 Gy
Session Number: 22

## 2023-12-14 ENCOUNTER — Ambulatory Visit
Admission: RE | Admit: 2023-12-14 | Discharge: 2023-12-14 | Disposition: A | Discharge status: Home / Self Care | Source: Ambulatory Visit | Attending: Radiation Oncology | Admitting: Radiation Oncology

## 2023-12-14 ENCOUNTER — Other Ambulatory Visit: Payer: Self-pay | Admitting: Nurse Practitioner

## 2023-12-14 ENCOUNTER — Telehealth: Payer: Self-pay

## 2023-12-14 ENCOUNTER — Other Ambulatory Visit: Payer: Self-pay

## 2023-12-14 ENCOUNTER — Inpatient Hospital Stay

## 2023-12-14 DIAGNOSIS — C210 Malignant neoplasm of anus, unspecified: Secondary | ICD-10-CM

## 2023-12-14 DIAGNOSIS — Z510 Encounter for antineoplastic radiation therapy: Secondary | ICD-10-CM | POA: Diagnosis not present

## 2023-12-14 LAB — RAD ONC ARIA SESSION SUMMARY
Course Elapsed Days: 32
Plan Fractions Treated to Date: 23
Plan Prescribed Dose Per Fraction: 1.8 Gy
Plan Total Fractions Prescribed: 28
Plan Total Prescribed Dose: 50.4 Gy
Reference Point Dosage Given to Date: 41.4 Gy
Reference Point Session Dosage Given: 1.8 Gy
Session Number: 23

## 2023-12-14 MED ORDER — HYDROCODONE-ACETAMINOPHEN 5-325 MG PO TABS: 1.0000 | ORAL_TABLET | Freq: Four times a day (QID) | ORAL | 0 refills | Status: AC | PRN

## 2023-12-14 NOTE — Patient Instructions (Signed)
 PICC Removal, Adult  A peripherally inserted central catheter (PICC) is a form of IV access that allows medicines and IV fluids to be quickly put into the blood and spread throughout the body. The PICC is a long, thin, flexible tube (catheter) that is put into a vein in a person's arm or leg. The PICC is guided through the vein until the tip or end of it is in a large vein called the superior vena cava (SVC) just outside the heart. A PICC may be removed if it is no longer needed, if it causes infection, or if it is not working properly. Taking out a PICC is usually a painless procedure. Only a trained health care provider should do it. Do not try to remove a PICC yourself. Tell your health care provider about: Any allergies you have. All medicines you are taking, including vitamins, herbs, eye drops, creams, and over-the-counter medicines. Any problems you or family members have had with anesthetic medicines. Any bleeding problems you have. Any surgeries you have had. Any medical conditions you have. Whether you are pregnant or may be pregnant. What are the risks? Generally, this is a safe procedure. However, problems may occur, including: Bleeding. Infection. A break in the PICC. Vein blockage caused by an air bubble (air embolism). What happens before the procedure? Discuss PICC removal with your health care team. You will need an order from your health care provider to have your PICC removed. Follow instructions from your health care provider about eating or drinking restrictions. In most cases, special preparation is not needed. Ask your health care provider about: Changing or stopping your regular medicines. This is especially important if you are taking diabetes medicines or blood thinners. Taking medicines such as aspirin and ibuprofen. These medicines can thin your blood. Do not take these medicines unless your health care provider tells you to take them. Taking over-the-counter  medicines, vitamins, herbs, and supplements. Plan to have a responsible adult care for you for the time you are told after you leave the hospital or clinic. This is important. What happens during the procedure? You will lie down flat. Your head may be positioned slightly lower than your heart. You may be told to keep as still as possible. The bandage (dressing) over your PICC will be removed. The place where the PICC exits the body (exit site) will be cleaned with a germ-killing (antiseptic) solution. A germ-free (sterile) gauze pad will be held gently over the exit site. Your health care provider will pull out the PICC slowly and steadily. You may be asked to hold your breath or exhale while this is done. After the PICC is removed, the health care provider will gently press on the exit site for about 5 minutes. Antibiotic or petroleum-based ointment will be put on the exit site. The exit site will be covered with an airtight (occlusive) sterile dressing or another type of dressing. The PICC will be closely checked to make sure that the entire tube has been removed. Part of the PICC may be tested for bacteria. The procedure may vary among health care providers and hospitals. What happens after the procedure? You may need to stay lying down for a period of time. You will be monitored to make sure that: No blood or fluid is draining from the exit site. There are no signs of an air embolism. Summary A PICC may be removed if it is no longer needed, if it causes infection, or if it is not working properly.  Only a trained health care provider should do this procedure. Do not try to remove a PICC yourself. Generally, this is a safe procedure. However, problems may occur, including bleeding, infection, or an air bubble (air embolism). After the procedure, you will be monitored to make sure that no blood or fluid is draining from the site and that there are no signs of an air embolism. This information  is not intended to replace advice given to you by your health care provider. Make sure you discuss any questions you have with your health care provider. Document Revised: 08/18/2020 Document Reviewed: 08/18/2020 Elsevier Patient Education  2024 [employer].

## 2023-12-14 NOTE — Progress Notes (Signed)
 Patient here for PICC removal. 52cm removed, patient tolerated. Dressing applied. Patient laid flat for 30 minutes post removal, VSS upon discharge.

## 2023-12-14 NOTE — Telephone Encounter (Signed)
 Patient called and request a refill of her Norco, I placed the request on the np's desk.

## 2023-12-17 ENCOUNTER — Telehealth: Payer: Self-pay

## 2023-12-17 ENCOUNTER — Other Ambulatory Visit: Payer: Self-pay

## 2023-12-17 ENCOUNTER — Inpatient Hospital Stay: Attending: Radiation Oncology

## 2023-12-17 ENCOUNTER — Ambulatory Visit
Admission: RE | Admit: 2023-12-17 | Discharge: 2023-12-17 | Disposition: A | Discharge status: Home / Self Care | Source: Ambulatory Visit | Attending: Radiation Oncology | Admitting: Radiation Oncology

## 2023-12-17 DIAGNOSIS — C210 Malignant neoplasm of anus, unspecified: Secondary | ICD-10-CM | POA: Insufficient documentation

## 2023-12-17 DIAGNOSIS — D709 Neutropenia, unspecified: Secondary | ICD-10-CM | POA: Insufficient documentation

## 2023-12-17 DIAGNOSIS — R197 Diarrhea, unspecified: Secondary | ICD-10-CM | POA: Insufficient documentation

## 2023-12-17 DIAGNOSIS — Z510 Encounter for antineoplastic radiation therapy: Secondary | ICD-10-CM | POA: Insufficient documentation

## 2023-12-17 DIAGNOSIS — Z452 Encounter for adjustment and management of vascular access device: Secondary | ICD-10-CM | POA: Diagnosis not present

## 2023-12-17 DIAGNOSIS — R600 Localized edema: Secondary | ICD-10-CM | POA: Insufficient documentation

## 2023-12-17 DIAGNOSIS — Z5111 Encounter for antineoplastic chemotherapy: Secondary | ICD-10-CM | POA: Insufficient documentation

## 2023-12-17 DIAGNOSIS — Z85828 Personal history of other malignant neoplasm of skin: Secondary | ICD-10-CM | POA: Diagnosis not present

## 2023-12-17 DIAGNOSIS — E785 Hyperlipidemia, unspecified: Secondary | ICD-10-CM | POA: Insufficient documentation

## 2023-12-17 DIAGNOSIS — F1020 Alcohol dependence, uncomplicated: Secondary | ICD-10-CM | POA: Insufficient documentation

## 2023-12-17 DIAGNOSIS — F418 Other specified anxiety disorders: Secondary | ICD-10-CM | POA: Insufficient documentation

## 2023-12-17 DIAGNOSIS — C44311 Basal cell carcinoma of skin of nose: Secondary | ICD-10-CM | POA: Insufficient documentation

## 2023-12-17 LAB — RAD ONC ARIA SESSION SUMMARY
Course Elapsed Days: 35
Plan Fractions Treated to Date: 24
Plan Prescribed Dose Per Fraction: 1.8 Gy
Plan Total Fractions Prescribed: 28
Plan Total Prescribed Dose: 50.4 Gy
Reference Point Dosage Given to Date: 43.2 Gy
Reference Point Session Dosage Given: 1.8 Gy
Session Number: 24

## 2023-12-17 LAB — CBC WITH DIFFERENTIAL (CANCER CENTER ONLY)
Abs Immature Granulocytes: 0.02 K/uL (ref 0.00–0.07)
Basophils Absolute: 0 K/uL (ref 0.0–0.1)
Basophils Relative: 0 %
Eosinophils Absolute: 0.1 K/uL (ref 0.0–0.5)
Eosinophils Relative: 4 %
HCT: 30.5 % — ABNORMAL LOW (ref 36.0–46.0)
Hemoglobin: 10.4 g/dL — ABNORMAL LOW (ref 12.0–15.0)
Immature Granulocytes: 1 %
Lymphocytes Relative: 6 %
Lymphs Abs: 0.2 K/uL — ABNORMAL LOW (ref 0.7–4.0)
MCH: 28.9 pg (ref 26.0–34.0)
MCHC: 34.1 g/dL (ref 30.0–36.0)
MCV: 84.7 fL (ref 80.0–100.0)
Monocytes Absolute: 0.2 K/uL (ref 0.1–1.0)
Monocytes Relative: 9 %
Neutro Abs: 2 K/uL (ref 1.7–7.7)
Neutrophils Relative %: 80 %
Platelet Count: 171 K/uL (ref 150–400)
RBC: 3.6 MIL/uL — ABNORMAL LOW (ref 3.87–5.11)
RDW: 18.4 % — ABNORMAL HIGH (ref 11.5–15.5)
WBC Count: 2.5 K/uL — ABNORMAL LOW (ref 4.0–10.5)
nRBC: 0 % (ref 0.0–0.2)

## 2023-12-17 NOTE — Telephone Encounter (Signed)
 The patient contacted us  reporting episodes of diarrhea. She mentioned she has taken only one dose of Imodium. I advised her that she may take up to eight pills per day if necessary. She was instructed to follow up with us  the following day to update us  on her condition and whether her symptoms are improving.

## 2023-12-18 ENCOUNTER — Telehealth: Payer: Self-pay | Admitting: *Deleted

## 2023-12-18 ENCOUNTER — Other Ambulatory Visit: Payer: Self-pay | Admitting: Radiation Oncology

## 2023-12-18 ENCOUNTER — Other Ambulatory Visit (HOSPITAL_COMMUNITY): Payer: Self-pay

## 2023-12-18 ENCOUNTER — Ambulatory Visit

## 2023-12-18 DIAGNOSIS — C210 Malignant neoplasm of anus, unspecified: Secondary | ICD-10-CM

## 2023-12-18 MED ORDER — HYDROCORTISONE ACETATE 25 MG RE SUPP
25.0000 mg | Freq: Two times a day (BID) | RECTAL | 2 refills | Status: AC
  Filled 2023-12-18: qty 12, 6d supply, fill #0
  Filled 2023-12-20: qty 12, 6d supply, fill #1
  Filled 2023-12-21: qty 12, 6d supply, fill #0

## 2023-12-18 NOTE — Telephone Encounter (Addendum)
 Called to report that since conversation yesterday, she is still having up to 8 small volume loose stools. Is on liquid diet only and taking #8 Imodium/day and #4 Lomotil/day. Stools having some mucous and blood tinted. Abdominal pain/cramping as well. No fever. Has placed a call to RT asking if she needs to come in today or not (last tx is 12/21/23). She thinks she is drinking plenty of fluids to replace what she is losing in stool. Sent chat message to Dr. Smitty nurse with her issues and request to be called about her RT today. Called patient back and confirmed that Dr. Smitty nurse called and tx will be held today. Reminded her to continue liquid diet and taking Imodium/Lomotil 8/day each. Let us  know if she needs IVF or develops any fever.

## 2023-12-19 ENCOUNTER — Ambulatory Visit

## 2023-12-19 ENCOUNTER — Ambulatory Visit
Admission: RE | Admit: 2023-12-19 | Discharge: 2023-12-19 | Disposition: A | Discharge status: Home / Self Care | Source: Ambulatory Visit | Attending: Radiation Oncology | Admitting: Radiation Oncology

## 2023-12-19 ENCOUNTER — Other Ambulatory Visit: Payer: Self-pay

## 2023-12-19 ENCOUNTER — Other Ambulatory Visit (HOSPITAL_COMMUNITY): Payer: Self-pay

## 2023-12-19 DIAGNOSIS — K56600 Partial intestinal obstruction, unspecified as to cause: Secondary | ICD-10-CM | POA: Diagnosis not present

## 2023-12-19 DIAGNOSIS — K529 Noninfective gastroenteritis and colitis, unspecified: Secondary | ICD-10-CM | POA: Diagnosis not present

## 2023-12-19 LAB — RAD ONC ARIA SESSION SUMMARY
Course Elapsed Days: 37
Plan Fractions Treated to Date: 25
Plan Prescribed Dose Per Fraction: 1.8 Gy
Plan Total Fractions Prescribed: 28
Plan Total Prescribed Dose: 50.4 Gy
Reference Point Dosage Given to Date: 45 Gy
Reference Point Session Dosage Given: 1.8 Gy
Session Number: 25

## 2023-12-20 ENCOUNTER — Other Ambulatory Visit: Payer: Self-pay

## 2023-12-20 ENCOUNTER — Ambulatory Visit
Admission: RE | Admit: 2023-12-20 | Discharge: 2023-12-20 | Disposition: A | Discharge status: Home / Self Care | Source: Ambulatory Visit | Attending: Radiation Oncology | Admitting: Radiation Oncology

## 2023-12-20 ENCOUNTER — Other Ambulatory Visit (HOSPITAL_BASED_OUTPATIENT_CLINIC_OR_DEPARTMENT_OTHER): Payer: Self-pay

## 2023-12-20 ENCOUNTER — Ambulatory Visit: Admission: RE | Admit: 2023-12-20 | Discharge: 2023-12-20 | Attending: Radiation Oncology

## 2023-12-20 ENCOUNTER — Ambulatory Visit

## 2023-12-20 ENCOUNTER — Other Ambulatory Visit: Payer: Self-pay | Admitting: Radiation Oncology

## 2023-12-20 LAB — RAD ONC ARIA SESSION SUMMARY
Course Elapsed Days: 38
Plan Fractions Treated to Date: 26
Plan Prescribed Dose Per Fraction: 1.8 Gy
Plan Total Fractions Prescribed: 28
Plan Total Prescribed Dose: 50.4 Gy
Reference Point Dosage Given to Date: 46.8 Gy
Reference Point Session Dosage Given: 1.8 Gy
Session Number: 26

## 2023-12-20 MED ORDER — DIPHENOXYLATE-ATROPINE 2.5-0.025 MG PO TABS: 2.0000 | ORAL_TABLET | Freq: Four times a day (QID) | ORAL | 0 refills | Status: AC | PRN

## 2023-12-21 ENCOUNTER — Ambulatory Visit

## 2023-12-21 ENCOUNTER — Telehealth: Payer: Self-pay | Admitting: Nurse Practitioner

## 2023-12-21 ENCOUNTER — Ambulatory Visit
Admission: RE | Admit: 2023-12-21 | Discharge: 2023-12-21 | Disposition: A | Discharge status: Home / Self Care | Source: Ambulatory Visit | Attending: Radiation Oncology | Admitting: Radiation Oncology

## 2023-12-21 ENCOUNTER — Other Ambulatory Visit (HOSPITAL_BASED_OUTPATIENT_CLINIC_OR_DEPARTMENT_OTHER): Payer: Self-pay

## 2023-12-21 ENCOUNTER — Other Ambulatory Visit: Payer: Self-pay

## 2023-12-21 ENCOUNTER — Inpatient Hospital Stay

## 2023-12-21 ENCOUNTER — Other Ambulatory Visit (HOSPITAL_COMMUNITY): Payer: Self-pay

## 2023-12-21 DIAGNOSIS — C210 Malignant neoplasm of anus, unspecified: Secondary | ICD-10-CM

## 2023-12-21 LAB — CMP (CANCER CENTER ONLY)
ALT: 14 U/L (ref 0–44)
AST: 14 U/L — ABNORMAL LOW (ref 15–41)
Albumin: 3.2 g/dL — ABNORMAL LOW (ref 3.5–5.0)
Alkaline Phosphatase: 74 U/L (ref 38–126)
Anion gap: 5 (ref 5–15)
BUN: 9 mg/dL (ref 8–23)
CO2: 27 mmol/L (ref 22–32)
Calcium: 8.5 mg/dL — ABNORMAL LOW (ref 8.9–10.3)
Chloride: 99 mmol/L (ref 98–111)
Creatinine: 0.74 mg/dL (ref 0.44–1.00)
GFR, Estimated: >60 mL/min (ref >60)
Glucose, Bld: 124 mg/dL — ABNORMAL HIGH (ref 70–99)
Potassium: 3.8 mmol/L (ref 3.5–5.1)
Sodium: 131 mmol/L — ABNORMAL LOW (ref 135–145)
Total Bilirubin: 0.6 mg/dL (ref 0.0–1.2)
Total Protein: 5.8 g/dL — ABNORMAL LOW (ref 6.5–8.1)

## 2023-12-21 LAB — RAD ONC ARIA SESSION SUMMARY
Course Elapsed Days: 39
Plan Fractions Treated to Date: 27
Plan Prescribed Dose Per Fraction: 1.8 Gy
Plan Total Fractions Prescribed: 28
Plan Total Prescribed Dose: 50.4 Gy
Reference Point Dosage Given to Date: 48.6 Gy
Reference Point Session Dosage Given: 1.8 Gy
Session Number: 27

## 2023-12-21 LAB — CBC WITH DIFFERENTIAL (CANCER CENTER ONLY)
Abs Immature Granulocytes: 0.01 K/uL (ref 0.00–0.07)
Basophils Absolute: 0 K/uL (ref 0.0–0.1)
Basophils Relative: 1 %
Eosinophils Absolute: 0 K/uL (ref 0.0–0.5)
Eosinophils Relative: 2 %
HCT: 27.8 % — ABNORMAL LOW (ref 36.0–46.0)
Hemoglobin: 9.8 g/dL — ABNORMAL LOW (ref 12.0–15.0)
Immature Granulocytes: 1 %
Lymphocytes Relative: 11 %
Lymphs Abs: 0.2 K/uL — ABNORMAL LOW (ref 0.7–4.0)
MCH: 29.6 pg (ref 26.0–34.0)
MCHC: 35.3 g/dL (ref 30.0–36.0)
MCV: 84 fL (ref 80.0–100.0)
Monocytes Absolute: 0.8 K/uL (ref 0.1–1.0)
Monocytes Relative: 43 %
Neutro Abs: 0.8 K/uL — ABNORMAL LOW (ref 1.7–7.7)
Neutrophils Relative %: 42 %
Platelet Count: 146 K/uL — ABNORMAL LOW (ref 150–400)
RBC: 3.31 MIL/uL — ABNORMAL LOW (ref 3.87–5.11)
RDW: 18.6 % — ABNORMAL HIGH (ref 11.5–15.5)
WBC Count: 1.9 K/uL — ABNORMAL LOW (ref 4.0–10.5)
nRBC: 0 % (ref 0.0–0.2)

## 2023-12-21 NOTE — Telephone Encounter (Signed)
 I called Erin [name] to review the labs from today.  The neutrophil count is lower.  We discussed precautions.  She understands to go to the emergency department over the weekend with fever, chills, other signs of infection.  We will arrange for her to have another CBC on Monday, 12/24/2023 at [facility].  She will follow-up with us  as scheduled at [facility] on 12/27/2023.

## 2023-12-22 ENCOUNTER — Encounter: Payer: Self-pay | Admitting: Oncology

## 2023-12-22 ENCOUNTER — Inpatient Hospital Stay (HOSPITAL_BASED_OUTPATIENT_CLINIC_OR_DEPARTMENT_OTHER)
Admission: EM | Admit: 2023-12-22 | Discharge: 2023-12-25 | DRG: 388 | Disposition: A | Discharge status: Home / Self Care | Attending: Emergency Medicine | Admitting: Emergency Medicine

## 2023-12-22 ENCOUNTER — Emergency Department (HOSPITAL_BASED_OUTPATIENT_CLINIC_OR_DEPARTMENT_OTHER)

## 2023-12-22 ENCOUNTER — Other Ambulatory Visit: Payer: Self-pay

## 2023-12-22 DIAGNOSIS — K56609 Unspecified intestinal obstruction, unspecified as to partial versus complete obstruction: Principal | ICD-10-CM | POA: Diagnosis present

## 2023-12-22 DIAGNOSIS — Z7983 Long term (current) use of bisphosphonates: Secondary | ICD-10-CM

## 2023-12-22 DIAGNOSIS — E66811 Obesity, class 1: Secondary | ICD-10-CM | POA: Diagnosis present

## 2023-12-22 DIAGNOSIS — Z79899 Other long term (current) drug therapy: Secondary | ICD-10-CM | POA: Diagnosis not present

## 2023-12-22 DIAGNOSIS — A09 Infectious gastroenteritis and colitis, unspecified: Secondary | ICD-10-CM | POA: Diagnosis present

## 2023-12-22 DIAGNOSIS — Z720 Tobacco use: Secondary | ICD-10-CM | POA: Diagnosis present

## 2023-12-22 DIAGNOSIS — D701 Agranulocytosis secondary to cancer chemotherapy: Secondary | ICD-10-CM | POA: Diagnosis present

## 2023-12-22 DIAGNOSIS — Z6834 Body mass index (BMI) 34.0-34.9, adult: Secondary | ICD-10-CM | POA: Diagnosis not present

## 2023-12-22 DIAGNOSIS — C210 Malignant neoplasm of anus, unspecified: Secondary | ICD-10-CM | POA: Diagnosis present

## 2023-12-22 DIAGNOSIS — R739 Hyperglycemia, unspecified: Secondary | ICD-10-CM | POA: Diagnosis present

## 2023-12-22 DIAGNOSIS — D61810 Antineoplastic chemotherapy induced pancytopenia: Secondary | ICD-10-CM | POA: Diagnosis present

## 2023-12-22 DIAGNOSIS — Z8249 Family history of ischemic heart disease and other diseases of the circulatory system: Secondary | ICD-10-CM | POA: Diagnosis not present

## 2023-12-22 DIAGNOSIS — F32A Depression, unspecified: Secondary | ICD-10-CM | POA: Diagnosis present

## 2023-12-22 DIAGNOSIS — E876 Hypokalemia: Secondary | ICD-10-CM | POA: Diagnosis not present

## 2023-12-22 DIAGNOSIS — D849 Immunodeficiency, unspecified: Secondary | ICD-10-CM | POA: Diagnosis present

## 2023-12-22 DIAGNOSIS — K56600 Partial intestinal obstruction, unspecified as to cause: Secondary | ICD-10-CM | POA: Diagnosis present

## 2023-12-22 DIAGNOSIS — D702 Other drug-induced agranulocytosis: Secondary | ICD-10-CM | POA: Diagnosis not present

## 2023-12-22 DIAGNOSIS — K529 Noninfective gastroenteritis and colitis, unspecified: Secondary | ICD-10-CM | POA: Insufficient documentation

## 2023-12-22 DIAGNOSIS — Z888 Allergy status to other drugs, medicaments and biological substances status: Secondary | ICD-10-CM

## 2023-12-22 DIAGNOSIS — I10 Essential (primary) hypertension: Secondary | ICD-10-CM | POA: Diagnosis present

## 2023-12-22 DIAGNOSIS — K644 Residual hemorrhoidal skin tags: Secondary | ICD-10-CM | POA: Diagnosis present

## 2023-12-22 DIAGNOSIS — E785 Hyperlipidemia, unspecified: Secondary | ICD-10-CM | POA: Diagnosis present

## 2023-12-22 DIAGNOSIS — R3129 Other microscopic hematuria: Secondary | ICD-10-CM | POA: Diagnosis present

## 2023-12-22 DIAGNOSIS — E871 Hypo-osmolality and hyponatremia: Secondary | ICD-10-CM | POA: Diagnosis present

## 2023-12-22 DIAGNOSIS — T451X5A Adverse effect of antineoplastic and immunosuppressive drugs, initial encounter: Secondary | ICD-10-CM | POA: Diagnosis present

## 2023-12-22 DIAGNOSIS — F17210 Nicotine dependence, cigarettes, uncomplicated: Secondary | ICD-10-CM | POA: Diagnosis present

## 2023-12-22 LAB — COMPREHENSIVE METABOLIC PANEL WITH GFR
ALT: 20 U/L (ref 0–44)
AST: 21 U/L (ref 15–41)
Albumin: 3.5 g/dL (ref 3.5–5.0)
Alkaline Phosphatase: 92 U/L (ref 38–126)
Anion gap: 11 (ref 5–15)
BUN: 11 mg/dL (ref 8–23)
CO2: 25 mmol/L (ref 22–32)
Calcium: 9.4 mg/dL (ref 8.9–10.3)
Chloride: 96 mmol/L — ABNORMAL LOW (ref 98–111)
Creatinine, Ser: 0.77 mg/dL (ref 0.44–1.00)
GFR, Estimated: >60 mL/min (ref >60)
Glucose, Bld: 141 mg/dL — ABNORMAL HIGH (ref 70–99)
Potassium: 3.7 mmol/L (ref 3.5–5.1)
Sodium: 132 mmol/L — ABNORMAL LOW (ref 135–145)
Total Bilirubin: 0.6 mg/dL (ref 0.0–1.2)
Total Protein: 6.2 g/dL — ABNORMAL LOW (ref 6.5–8.1)

## 2023-12-22 LAB — CBC WITH DIFFERENTIAL/PLATELET
Abs Immature Granulocytes: 0.01 K/uL (ref 0.00–0.07)
Basophils Absolute: 0 K/uL (ref 0.0–0.1)
Basophils Relative: 0 %
Eosinophils Absolute: 0 K/uL (ref 0.0–0.5)
Eosinophils Relative: 1 %
HCT: 31 % — ABNORMAL LOW (ref 36.0–46.0)
Hemoglobin: 10.6 g/dL — ABNORMAL LOW (ref 12.0–15.0)
Immature Granulocytes: 1 %
Lymphocytes Relative: 6 %
Lymphs Abs: 0.1 K/uL — ABNORMAL LOW (ref 0.7–4.0)
MCH: 29.4 pg (ref 26.0–34.0)
MCHC: 34.2 g/dL (ref 30.0–36.0)
MCV: 85.9 fL (ref 80.0–100.0)
Monocytes Absolute: 0.7 K/uL (ref 0.1–1.0)
Monocytes Relative: 49 %
Neutro Abs: 0.6 K/uL — ABNORMAL LOW (ref 1.7–7.7)
Neutrophils Relative %: 43 %
Platelets: 159 K/uL (ref 150–400)
RBC: 3.61 MIL/uL — ABNORMAL LOW (ref 3.87–5.11)
RDW: 18.8 % — ABNORMAL HIGH (ref 11.5–15.5)
WBC: 1.5 K/uL — ABNORMAL LOW (ref 4.0–10.5)
nRBC: 0 % (ref 0.0–0.2)

## 2023-12-22 LAB — URINALYSIS, ROUTINE W REFLEX MICROSCOPIC
Bilirubin Urine: NEGATIVE
Glucose, UA: NEGATIVE mg/dL
Ketones, ur: NEGATIVE mg/dL
Nitrite: NEGATIVE
Protein, ur: 30 mg/dL — AB
Specific Gravity, Urine: 1.02 (ref 1.005–1.030)
WBC, UA: >50 WBC/hpf (ref 0–5)
pH: 6.5 (ref 5.0–8.0)

## 2023-12-22 LAB — C DIFFICILE QUICK SCREEN W PCR REFLEX
C Diff antigen: NEGATIVE
C Diff interpretation: NOT DETECTED
C Diff toxin: NEGATIVE

## 2023-12-22 LAB — LIPASE, BLOOD: Lipase: 11 U/L (ref 11–51)

## 2023-12-22 LAB — LACTIC ACID, PLASMA
Lactic Acid, Venous: 0.6 mmol/L (ref 0.5–1.9)
Lactic Acid, Venous: 1.4 mmol/L (ref 0.5–1.9)

## 2023-12-22 MED ORDER — SODIUM CHLORIDE 0.9 % IV SOLN
1.0000 g | Freq: Once | INTRAVENOUS | Status: AC
  Administered 2023-12-22: 1 g via INTRAVENOUS
  Filled 2023-12-22: qty 10

## 2023-12-22 MED ORDER — BUSPIRONE HCL 5 MG PO TABS
5.0000 mg | ORAL_TABLET | Freq: Every day | ORAL | Status: DC
  Administered 2023-12-22 – 2023-12-24 (×3): 5 mg via ORAL
  Filled 2023-12-22 (×3): qty 1

## 2023-12-22 MED ORDER — ENOXAPARIN SODIUM 40 MG/0.4ML IJ SOSY
40.0000 mg | PREFILLED_SYRINGE | INTRAMUSCULAR | Status: DC
  Administered 2023-12-22 – 2023-12-24 (×3): 40 mg via SUBCUTANEOUS
  Filled 2023-12-22 (×3): qty 0.4

## 2023-12-22 MED ORDER — SODIUM CHLORIDE 0.9 % IV BOLUS
1000.0000 mL | Freq: Once | INTRAVENOUS | Status: AC
  Administered 2023-12-22: 1000 mL via INTRAVENOUS

## 2023-12-22 MED ORDER — SODIUM CHLORIDE 0.9 % IV SOLN
2.0000 g | INTRAVENOUS | Status: DC
  Administered 2023-12-23 – 2023-12-25 (×3): 2 g via INTRAVENOUS
  Filled 2023-12-22 (×3): qty 20

## 2023-12-22 MED ORDER — HYDROCODONE-ACETAMINOPHEN 5-325 MG PO TABS
1.0000 | ORAL_TABLET | Freq: Four times a day (QID) | ORAL | Status: DC | PRN
  Administered 2023-12-22 – 2023-12-25 (×5): 1 via ORAL
  Filled 2023-12-22 (×5): qty 1

## 2023-12-22 MED ORDER — ATORVASTATIN CALCIUM 20 MG PO TABS
80.0000 mg | ORAL_TABLET | Freq: Every day | ORAL | Status: DC
  Administered 2023-12-22 – 2023-12-24 (×3): 80 mg via ORAL
  Filled 2023-12-22 (×3): qty 4

## 2023-12-22 MED ORDER — ALPRAZOLAM 0.5 MG PO TABS: 0.5000 mg | ORAL_TABLET | Freq: Every day | ORAL | Status: DC | PRN

## 2023-12-22 MED ORDER — SODIUM CHLORIDE 0.9 % IV SOLN: 2.0000 g | INTRAVENOUS | Status: DC

## 2023-12-22 MED ORDER — CLONIDINE HCL 0.1 MG PO TABS
0.1000 mg | ORAL_TABLET | Freq: Every day | ORAL | Status: DC
  Administered 2023-12-22 – 2023-12-24 (×3): 0.1 mg via ORAL
  Filled 2023-12-22 (×3): qty 1

## 2023-12-22 MED ORDER — METRONIDAZOLE 500 MG/100ML IV SOLN
500.0000 mg | Freq: Two times a day (BID) | INTRAVENOUS | Status: DC
  Administered 2023-12-23 – 2023-12-25 (×6): 500 mg via INTRAVENOUS
  Filled 2023-12-22 (×6): qty 100

## 2023-12-22 MED ORDER — METRONIDAZOLE 500 MG/100ML IV SOLN
500.0000 mg | Freq: Once | INTRAVENOUS | Status: AC
  Administered 2023-12-22: 500 mg via INTRAVENOUS
  Filled 2023-12-22: qty 100

## 2023-12-22 MED ORDER — IOHEXOL 300 MG/ML  SOLN
75.0000 mL | Freq: Once | INTRAMUSCULAR | Status: AC | PRN
  Administered 2023-12-22: 75 mL via INTRAVENOUS

## 2023-12-22 MED ORDER — HYDROXYZINE HCL 25 MG PO TABS
50.0000 mg | ORAL_TABLET | Freq: Every day | ORAL | Status: DC
  Administered 2023-12-22 – 2023-12-24 (×3): 50 mg via ORAL
  Filled 2023-12-22 (×3): qty 2

## 2023-12-22 MED ORDER — BUSPIRONE HCL 5 MG PO TABS
10.0000 mg | ORAL_TABLET | Freq: Every day | ORAL | Status: DC
  Administered 2023-12-23 – 2023-12-25 (×3): 10 mg via ORAL
  Filled 2023-12-22 (×3): qty 2

## 2023-12-22 MED ORDER — LACTATED RINGERS IV SOLN: INTRAVENOUS | Status: AC

## 2023-12-22 MED ORDER — FLUOXETINE HCL 20 MG PO CAPS
40.0000 mg | ORAL_CAPSULE | Freq: Every day | ORAL | Status: DC
  Administered 2023-12-23 – 2023-12-25 (×3): 40 mg via ORAL
  Filled 2023-12-22 (×3): qty 2

## 2023-12-22 MED ORDER — NICOTINE 14 MG/24HR TD PT24
14.0000 mg | MEDICATED_PATCH | Freq: Every day | TRANSDERMAL | Status: DC
  Administered 2023-12-22 – 2023-12-25 (×4): 14 mg via TRANSDERMAL
  Filled 2023-12-22 (×4): qty 1

## 2023-12-22 MED ORDER — LURASIDONE HCL 40 MG PO TABS
40.0000 mg | ORAL_TABLET | Freq: Every day | ORAL | Status: DC
  Administered 2023-12-22 – 2023-12-24 (×3): 40 mg via ORAL
  Filled 2023-12-22 (×4): qty 1

## 2023-12-22 MED ORDER — BUPROPION HCL ER (XL) 150 MG PO TB24
150.0000 mg | ORAL_TABLET | Freq: Every morning | ORAL | Status: DC
  Administered 2023-12-23 – 2023-12-25 (×3): 150 mg via ORAL
  Filled 2023-12-22 (×3): qty 1

## 2023-12-22 MED ORDER — SODIUM CHLORIDE 0.9 % IV SOLN: 2.0000 g | Freq: Once | INTRAVENOUS | Status: DC

## 2023-12-22 NOTE — ED Provider Notes (Signed)
 [place] EMERGENCY DEPARTMENT AT [facility] Provider Note   CSN: [id number] Arrival date & time: 12/22/23  1004     Patient presents with: No chief complaint on file.   Erin [name] is a 73 y.o. female.   The history is provided by the patient, a relative and medical records. No language interpreter was used.     73 year old female with history of anal cancer stage IIa, alcohol disorder, tobacco abuse, hyperlipidemia, currently receiving chemo and radiation therapy presented to the ER accompanied by his son for concerns of diarrhea.  Patient report for the past week she has had persistent diarrhea.  States she would have up to 8 episodes a day with blood and mucus in her diarrhea.  She also endorses diffuse abdominal cramping.  She reports decrease in appetite and decreased oral intake.  She also report having a fever as high as 101.5 this morning.  She did take a hydrocodone  as well as Imodium for her symptoms.  She currently receiving chemotherapy and last treatment was yesterday.  Her oncologist is Dr. Lindalou.  She was hospitalized 3 weeks ago for somewhat of a similar symptom with diarrhea and fever.  She has been on antibiotic recently.  No prior history of C. difficile.  No nausea or vomiting.  No urinary complaint.  Prior to Admission medications   Medication Sig Start Date End Date Taking? Authorizing Provider  alendronate (FOSAMAX) 70 MG tablet Take 70 mg by mouth every 7 (seven) days. Tuesdays 08/27/23   [provider]  ALPRAZolam (XANAX) 0.5 MG tablet Take 0.5 mg by mouth daily as needed for anxiety. 10/31/23   [provider]  alum & mag hydroxide-simeth-diphenhydrAMINE-nystatin Swish and spit 5 mLs 4 (four) times daily as needed for mouth pain. 11/16/23     atorvastatin  (LIPITOR) 80 MG tablet Take 80 mg by mouth at bedtime.    [provider]  buPROPion  (WELLBUTRIN  XL) 150 MG 24 hr tablet Take 150 mg by mouth every morning. 03/20/20    [provider]  busPIRone (BUSPAR) 5 MG tablet Take 5 mg by mouth 2 (two) times daily. 03/19/23   [provider]  cloNIDine  (CATAPRES ) 0.1 MG tablet Take 0.2 mg by mouth at bedtime. 03/20/20   [provider]  diphenoxylate-atropine (LOMOTIL) 2.5-0.025 MG tablet Take 2 tablets by mouth 4 (four) times daily as needed for diarrhea or loose stools. 12/20/23   Dewey Rush, MD  ezetimibe (ZETIA) 10 MG tablet Take 10 mg by mouth daily. 07/31/23   [provider]  FLUoxetine  (PROZAC ) 40 MG capsule Take 40 mg by mouth daily. 09/15/21   [provider]  HYDROcodone -acetaminophen  (NORCO/VICODIN) 5-325 MG tablet Take 1 tablet by mouth every 6 (six) hours as needed for moderate pain (pain score 4-6). 12/14/23   Thomas, Lisa K, NP  hydrocortisone (ANUSOL-HC) 25 MG suppository Place 1 suppository (25 mg total) rectally 2 (two) times daily. 12/18/23   Lanell Donald Stagger, PA-C  hydrOXYzine  (ATARAX /VISTARIL ) 25 MG tablet Take 50 mg by mouth at bedtime. 03/04/20   [provider]  loperamide (IMODIUM) 2 MG capsule Take 4 mg by mouth as needed for diarrhea or loose stools.    [provider]  lurasidone  (LATUDA ) 40 MG TABS tablet Take 40 mg by mouth at bedtime.    [provider]  nicotine  (NICODERM CQ  - DOSED IN MG/24 HOURS) 21 mg/24hr patch Place 1 patch (21 mg total) onto the skin daily. 12/28/21   Sebastian Toribio GAILS,  MD  nystatin (MYCOSTATIN) 100000 UNIT/ML suspension Take by mouth. 11/16/23   [provider]  pantoprazole  (PROTONIX ) 40 MG tablet Take 1 tablet (40 mg total) by mouth daily at 6 (six) AM. Patient not taking: Reported on 12/10/2023 12/28/21   Sebastian Toribio GAILS, MD  prochlorperazine  (COMPAZINE ) 10 MG tablet Take 1 tablet (10 mg total) by mouth every 6 (six) hours as needed for nausea or vomiting. Patient not taking: Reported on 12/10/2023 10/29/23   Cloretta Arley NOVAK, MD    Allergies: Lamictal [lamotrigine]    Review of  Systems  All other systems reviewed and are negative.   Updated Vital Signs BP (!) 140/74   Pulse 91   Temp 98.9 F (37.2 C) (Oral)   Resp (!) 28   SpO2 97%   Physical Exam Vitals and nursing note reviewed.  Constitutional:      General: She is not in acute distress.    Appearance: She is well-developed. She is diaphoretic.  HENT:     Head: Atraumatic.  Eyes:     Conjunctiva/sclera: Conjunctivae normal.  Cardiovascular:     Rate and Rhythm: Tachycardia present.     Pulses: Normal pulses.     Heart sounds: Normal heart sounds.  Pulmonary:     Effort: Pulmonary effort is normal.     Breath sounds: No wheezing, rhonchi or rales.  Abdominal:     Palpations: Abdomen is soft.     Tenderness: There is abdominal tenderness (Mild tenderness to lower abdomen on palpation no guarding no rebound tenderness.  Bowel sounds present.).  Genitourinary:    Comments: Rectal exam is deferred Musculoskeletal:     Cervical back: Neck supple.  Skin:    Findings: No rash.  Neurological:     Mental Status: She is alert.  Psychiatric:        Mood and Affect: Mood normal.     (all labs ordered are listed, but only abnormal results are displayed) Labs Reviewed  CBC WITH DIFFERENTIAL/PLATELET - Abnormal; Notable for the following components:      Result Value   WBC 1.5 (*)    RBC 3.61 (*)    Hemoglobin 10.6 (*)    HCT 31.0 (*)    RDW 18.8 (*)    Neutro Abs 0.6 (*)    Lymphs Abs 0.1 (*)    All other components within normal limits  COMPREHENSIVE METABOLIC PANEL WITH GFR - Abnormal; Notable for the following components:   Sodium 132 (*)    Chloride 96 (*)    Glucose, Bld 141 (*)    Total Protein 6.2 (*)    All other components within normal limits  URINALYSIS, ROUTINE W REFLEX MICROSCOPIC - Abnormal; Notable for the following components:   APPearance CLOUDY (*)    Hgb urine dipstick MODERATE (*)    Protein, ur 30 (*)    Leukocytes,Ua LARGE (*)    Bacteria, UA RARE (*)    All  other components within normal limits  C DIFFICILE QUICK SCREEN W PCR REFLEX    LIPASE, BLOOD  LACTIC ACID, PLASMA  LACTIC ACID, PLASMA  URINALYSIS, ROUTINE W REFLEX MICROSCOPIC    EKG: None  Radiology: CT ABDOMEN PELVIS W CONTRAST Result Date: 12/22/2023 EXAM: CT ABDOMEN AND PELVIS WITH CONTRAST 12/22/2023 12:10:57 PM TECHNIQUE: CT of the abdomen and pelvis was performed with the administration of 75 mL of iohexol  (OMNIPAQUE ) 300 MG/ML solution. Multiplanar reformatted images are provided for review. Automated exposure control, iterative reconstruction, and/or weight-based adjustment of  the mA/kV was utilized to reduce the radiation dose to as low as reasonably achievable. COMPARISON: Unchanged from previous exam. CLINICAL HISTORY: Abdominal pain, acute, nonlocalized. FINDINGS: LOWER CHEST: Coronary artery calcifications. LIVER: Cyst within the right lobe of the liver measures 4.8 cm (image 20/3). Unchanged from previous exam. GALLBLADDER AND BILE DUCTS: Gallbladder is unremarkable. No biliary ductal dilatation. SPLEEN: The spleen is within normal limits in size and appearance. PANCREAS: The pancreas is normal in size and contour without focal lesion or ductal dilatation. ADRENAL GLANDS: Normal size and morphology bilaterally. No nodule, thickening, or hemorrhage. No periadrenal stranding. KIDNEYS, URETERS AND BLADDER: No stones in the kidneys or ureters. No hydronephrosis. No perinephric or periureteral stranding. Urinary bladder is unremarkable. GI AND BOWEL: Normal stomach. Dilated proximal and mid small bowel loops with scattered air-fluid levels measure up to 3.9 cm (image 54/3). Just before the terminal ileum there is a long segment of small bowel with abnormal wall thickening with surrounding congestion of the small bowel mesentery, soft tissue stranding and fluid. No pneumatosis. PERITONEUM AND RETROPERITONEUM: Small to moderate volume of free fluid noted within the pelvis. No fluid loculation  identified. No signs of pneumoperitoneum. VASCULATURE: Aorta is normal in caliber. Aortic atherosclerotic calcifications. Abdominal vasculature is patent. LYMPH NODES: No abdominopelvic adenopathy. REPRODUCTIVE ORGANS: Uterus and adnexal structures are normal. Uterus appears normal. No adnexal mass. BONES AND SOFT TISSUES: No acute or suspicious osseous findings. No focal soft tissue abnormality. IMPRESSION: 1. Long segment small-bowel inflammation with wall thickening and surrounding mesenteric congestion and fluid near the terminal ileum, without pneumatosis. Imaging findings are compatible with an inflammatory or infectious enteritis. 2. Small-bowel obstruction pattern with dilated proximal and mid small-bowel loops and air-fluid levels measuring up to 3.9 cm. 3. Small to moderate volume free fluid in the pelvis without loculation. Electronically signed by: Waddell Calk MD 12/22/2023 12:31 PM EST RP Workstation: [id number]     Procedures   Medications Ordered in the ED  metroNIDAZOLE (FLAGYL) IVPB 500 mg (500 mg Intravenous New Bag/Given 12/22/23 1344)  sodium chloride  0.9 % bolus 1,000 mL ( Intravenous Stopped 12/22/23 1148)  iohexol  (OMNIPAQUE ) 300 MG/ML solution 75 mL (75 mLs Intravenous Contrast Given 12/22/23 1154)  cefTRIAXone  (ROCEPHIN ) 1 g in sodium chloride  0.9 % 100 mL IVPB (0 g Intravenous Stopped 12/22/23 1344)                                    Medical Decision Making Amount and/or Complexity of Data Reviewed Labs: ordered. Radiology: ordered.  Risk Prescription drug management.   BP (!) 146/65   Pulse (!) 106   Temp 98.5 F (36.9 C) (Oral)   Resp (!) 21   SpO2 95%   27:20 AM   73 year old female with history of anal cancer stage IIa, alcohol disorder, tobacco abuse, hyperlipidemia, currently receiving chemo and radiation therapy presented to the ER accompanied by his son for concerns of diarrhea.  Patient report for the past week she has had persistent diarrhea.   States she would have up to 8 episodes a day with blood and mucus in her diarrhea.  She also endorses diffuse abdominal cramping.  She reports decrease in appetite and decreased oral intake.  She also report having a fever as high as 101.5 this morning.  She did take a hydrocodone  as well as Imodium for her symptoms.  She currently receiving chemotherapy and last treatment was yesterday.  Her  oncologist is Dr. Lindalou.  She was hospitalized 3 weeks ago for somewhat of a similar symptom with diarrhea and fever.  She has been on antibiotic recently.  No prior history of C. difficile.  No nausea or vomiting.  No urinary complaint.  On exam, patient is laying in bed appears to be in no acute discomfort.  She is however diaphoretic.  She does have some mild abdominal tenderness on palpation and her skin is cool and clammy.  -Labs ordered, independently viewed and interpreted by me.  Labs remarkable for initial UA shows signs of UTI however staff was notified that urinalysis was mistakenly run from the stool sample that was sent.  Will collect another urinalysis.   -The patient was maintained on a cardiac monitor.  I personally viewed and interpreted the cardiac monitored which showed an underlying rhythm of: NSR -Imaging independently viewed and interpreted by me and I agree with radiologist's interpretation.  Result remarkable for abd/pelvis CT shows signs of enteritis as well as SBO -This patient presents to the ED for concern of diarrhea, this involves an extensive number of treatment options, and is a complaint that carries with it a high risk of complications and morbidity.  The differential diagnosis includes c.diff, colitis, sbo, appendicitis, cholecystitis, viral gi -Co morbidities that complicate the patient evaluation includes anal cancer, hld, alcohol disorder -Treatment includes rocephin , flagyl, ivf,  -Reevaluation of the patient after these medicines showed that the patient improved -PCP office  notes or outside notes reviewed -Discussion with specialist gen surgery Dr. Stevie who request medicine admission.  Appreciate consult from [facility] Dr. Austria who agrees to admit pt -Escalation to admission/observation considered: patient is agreeable with admission      Final diagnoses:  SBO (small bowel obstruction) [facility])  Enteritis    ED Discharge Orders     None          Nivia Colon, PA-C 12/22/23 1458    Elnor Jayson LABOR, DO 12/28/23 1812

## 2023-12-22 NOTE — ED Triage Notes (Signed)
 Pt reports diarrhea for 1 week, many episodes daily with limited appetite and little fluid consumption.  Pt denies any vomiting, occasional abdominal cramping associated with diarrhea and a fever of 101.82F today at home.  Pt currently being tx for cancer with q5day radiation and once/week chemo.  Pt alert and diaphoretic in triage, no fever at this time. Pt took hydrocodone  and anti-diarrhea med at home this morning.

## 2023-12-22 NOTE — Progress Notes (Signed)
 Plan of Care Note for accepted transfer   Patient: Erin [name] MRN: [id number]   DOA: 12/22/2023  Facility requesting transfer: [facility] Requesting Provider: Lonni Camp PA Reason for transfer: Small bowel obstruction, enteritis concerning for infectious etiology Facility course:   KEIA RASK is a 73 year old female with past medical history significant for anxiety/depression, HLD, anal cancer currently on chemoradiation who presented to [facility] with 1 week history of persistent diarrhea.  8-9 episodes per day.  Patient is afebrile, WBC count 1.5.  CT abdomen/pelvis with findings consistent with infectious versus inflammatory enteritis and small bowel obstruction.  Received IV ceftriaxone  and metronidazole.  EDP also reports recent use of antibiotics outpatient.  EDP discussed with general surgery, Dr. Stevie who recommended transfer to [facility] under hospitalist service.   Plan of care: The patient is accepted for admission to Med-surg  unit, at [facility].  C. difficile PCR: Pending Requested add-on of GI PCR panel Needs repeat urinalysis, original urinalysis performed apparently was run on stool sample   Author: Camellia PARAS Crue Otero, DO 12/22/2023  Check [email or web address] for on-call coverage.  Nursing staff, Please call [facility] Admits & Consults System-Wide number on Amion as soon as patient's arrival, so appropriate admitting provider can evaluate the pt.

## 2023-12-22 NOTE — H&P (Signed)
 History and Physical    Erin [name] FMW:[id number] DOB: 1950/04/05 DOA: 12/22/2023  PCP: [name], PA-C  Patient coming from: Home.  Independently ambulates at baseline.  Chief Complaint: Diarrhea  HPI:  Erin [name] is 73 y.o. female with PMH of oral cancer on chemotherapy and radiation, HTN, tobacco use disorder and depression presented to [facility] ED with worsening diarrhea.  Patient reports ongoing diarrhea since the end of September that she attributes to radiation and chemotherapy.  She has been managing this with Lomotil and Imodium.  However, the diarrhea gotten worse over the last 1 week despite those medications.  Reports multiple episodes of watery bowel movements a day.  Sometimes with blood tinge.  Also reports abdominal cramping.  She noted fever to 101.4 this morning.  She denies nausea or vomiting.  She noted some cough but denies shortness of breath or chest pain.  She reports worsening abdominal pain with cough and bowel movement.  She states her abdominal pain is 10/10 but resolved after pain medication in ED.  Currently pain-free.  Last bowel movement about 2 hours ago, small volume.  She also reports dysuria that she attributes to radiation.  Denies focal weakness, numbness or tingling.  Patient smokes about 5 cigarettes a day.  Denies drinking alcohol or recreational drug use.  She is interested in cardiopulmonary cessation event of sudden cardiopulmonary arrest.  In ED, afebrile.  Slightly tachycardic to 106.  RR in 20s.  Normotensive.  WBC 1.5 (was 1.9 yesterday).  Hgb 10.6 (baseline).  Na 132.  Glucose 141.  Otherwise, CMP and CBC without significant finding.  Lactic acid negative x 2.  UA with large LE, rare bacteria, moderate Hgb and 21-50 RBC on hpf.  CT abdomen and pelvis with long segment small bowel inflammation with wall thickening and surrounding mesenteric congestion and fluid near the terminal ileum concerning for inflammatory or  infectious enteritis, and small bowel obstruction pattern with dilated proximal and mid small bowel loops and air-fluid levels measuring up to 3.9 cm and possible small to moderate volume ascites.  C. difficile and GIP ordered.  Received 1 L fluid bolus.  Started on ceftriaxone  and Flagyl.  General surgery consulted.  Admission accepted by my colleague earlier.  ROS All review of system negative except for pertinent positives and negatives as history of present illness above.  PMH Past Medical History:  Diagnosis Date   Alcohol abuse 11/08/2016   Alcoholism (HCC)    Depression    Hyperlipemia     PSH Past Surgical History:  Procedure Laterality Date   NOSE SURGERY      Fam HX Family History  Problem Relation Age of Onset   Heart failure Father     Social Hx  reports that she has been smoking cigarettes. She has never used smokeless tobacco. She reports current alcohol use. She reports that she does not use drugs.   Allergy Allergies  Allergen Reactions   Lamictal [Lamotrigine] Swelling and Other (See Comments)    Caused swelling of the face    Home Meds Prior to Admission medications   Medication Sig Start Date End Date Taking? Authorizing Provider  alendronate (FOSAMAX) 70 MG tablet Take 70 mg by mouth every 7 (seven) days. Tuesdays 08/27/23   [provider]  ALPRAZolam (XANAX) 0.5 MG tablet Take 0.5 mg by mouth daily as needed for anxiety. 10/31/23   [provider]  alum & mag hydroxide-simeth-diphenhydrAMINE-nystatin Swish and spit 5 mLs 4 (four) times daily  as needed for mouth pain. 11/16/23     atorvastatin  (LIPITOR) 80 MG tablet Take 80 mg by mouth at bedtime.    [provider]  buPROPion  (WELLBUTRIN  XL) 150 MG 24 hr tablet Take 150 mg by mouth every morning. 03/20/20   [provider]  busPIRone (BUSPAR) 5 MG tablet Take 5 mg by mouth 2 (two) times daily. 03/19/23   [provider]  cloNIDine  (CATAPRES ) 0.1 MG tablet Take  0.2 mg by mouth at bedtime. 03/20/20   [provider]  diphenoxylate-atropine (LOMOTIL) 2.5-0.025 MG tablet Take 2 tablets by mouth 4 (four) times daily as needed for diarrhea or loose stools. 12/20/23   Dewey Rush, MD  ezetimibe (ZETIA) 10 MG tablet Take 10 mg by mouth daily. 07/31/23   [provider]  FLUoxetine  (PROZAC ) 40 MG capsule Take 40 mg by mouth daily. 09/15/21   [provider]  HYDROcodone -acetaminophen  (NORCO/VICODIN) 5-325 MG tablet Take 1 tablet by mouth every 6 (six) hours as needed for moderate pain (pain score 4-6). 12/14/23   Thomas, Lisa K, NP  hydrocortisone (ANUSOL-HC) 25 MG suppository Place 1 suppository (25 mg total) rectally 2 (two) times daily. 12/18/23   Lanell Donald Stagger, PA-C  hydrOXYzine  (ATARAX /VISTARIL ) 25 MG tablet Take 50 mg by mouth at bedtime. 03/04/20   [provider]  loperamide (IMODIUM) 2 MG capsule Take 4 mg by mouth as needed for diarrhea or loose stools.    [provider]  lurasidone  (LATUDA ) 40 MG TABS tablet Take 40 mg by mouth at bedtime.    [provider]  nicotine  (NICODERM CQ  - DOSED IN MG/24 HOURS) 21 mg/24hr patch Place 1 patch (21 mg total) onto the skin daily. 12/28/21   Sebastian Toribio GAILS, MD  nystatin (MYCOSTATIN) 100000 UNIT/ML suspension Take by mouth. 11/16/23   [provider]  pantoprazole  (PROTONIX ) 40 MG tablet Take 1 tablet (40 mg total) by mouth daily at 6 (six) AM. Patient not taking: Reported on 12/10/2023 12/28/21   Sebastian Toribio GAILS, MD  prochlorperazine  (COMPAZINE ) 10 MG tablet Take 1 tablet (10 mg total) by mouth every 6 (six) hours as needed for nausea or vomiting. Patient not taking: Reported on 12/10/2023 10/29/23   Cloretta Arley NOVAK, MD    Physical Exam: Vitals:   12/22/23 1500 12/22/23 1530 12/22/23 1545 12/22/23 1635  BP: (!) 143/63 (!) 120/46  133/62  Pulse: 92 91  (!) 101  Resp: (!) 31 (!) 24  16  Temp:   98.7 F (37.1 C) 98.9 F (37.2 C)  TempSrc:     Oral  SpO2: 94% 95%  94%    GENERAL: No acute distress.  Appears well.  HEENT: MMM.  Vision and hearing grossly intact.  NECK: Supple.  No apparent JVD.  RESP:  No IWOB. Good air movement bilaterally. CVS:  RRR. Heart sounds normal.  ABD/GI/GU: Bowel sounds present. Soft. Non tender.  MSK/EXT:  Moves extremities. No apparent deformity or edema.  SKIN: no apparent skin lesion or wound NEURO: Awake, alert and oriented appropriately.  No gross deficit.  PSYCH: Calm. Normal affect.   Personally Reviewed Radiological Exams See HPI   Personally Reviewed Labs: CBC: Recent Labs  Lab 12/17/23 1224 12/21/23 1358 12/22/23 1045  WBC 2.5* 1.9* 1.5*  NEUTROABS 2.0 0.8* 0.6*  HGB 10.4* 9.8* 10.6*  HCT 30.5* 27.8* 31.0*  MCV 84.7 84.0 85.9  PLT 171 146* 159   Basic Metabolic Panel: Recent Labs  Lab 12/21/23 1358 12/22/23 1045  NA  131* 132*  K 3.8 3.7  CL 99 96*  CO2 27 25  GLUCOSE 124* 141*  BUN 9 11  CREATININE 0.74 0.77  CALCIUM  8.5* 9.4   GFR: Estimated Creatinine Clearance: 83.3 mL/min (by C-G formula based on SCr of 0.77 mg/dL). Liver Function Tests: Recent Labs  Lab 12/21/23 1358 12/22/23 1045  AST 14* 21  ALT 14 20  ALKPHOS 74 92  BILITOT 0.6 0.6  PROT 5.8* 6.2*  ALBUMIN 3.2* 3.5   Recent Labs  Lab 12/22/23 1045  LIPASE 11   No results for input(s): AMMONIA in the last 168 hours. Coagulation Profile: No results for input(s): INR, PROTIME in the last 168 hours. Cardiac Enzymes: No results for input(s): CKTOTAL, CKMB, CKMBINDEX, TROPONINI in the last 168 hours. BNP (last 3 results) No results for input(s): PROBNP in the last 8760 hours. HbA1C: No results for input(s): HGBA1C in the last 72 hours. CBG: No results for input(s): GLUCAP in the last 168 hours. Lipid Profile: No results for input(s): CHOL, HDL, LDLCALC, TRIG, CHOLHDL, LDLDIRECT in the last 72 hours. Thyroid Function Tests: No results for input(s): TSH,  T4TOTAL, FREET4, T3FREE, THYROIDAB in the last 72 hours. Anemia Panel: No results for input(s): VITAMINB12, FOLATE, FERRITIN, TIBC, IRON, RETICCTPCT in the last 72 hours. Urine analysis:    Component Value Date/Time   COLORURINE YELLOW 12/22/2023 1135   APPEARANCEUR CLOUDY (A) 12/22/2023 1135   LABSPEC 1.020 12/22/2023 1135   PHURINE 6.5 12/22/2023 1135   GLUCOSEU NEGATIVE 12/22/2023 1135   HGBUR MODERATE (A) 12/22/2023 1135   BILIRUBINUR NEGATIVE 12/22/2023 1135   KETONESUR NEGATIVE 12/22/2023 1135   PROTEINUR 30 (A) 12/22/2023 1135   UROBILINOGEN 0.2 09/06/2009 1350   NITRITE NEGATIVE 12/22/2023 1135   LEUKOCYTESUR LARGE (A) 12/22/2023 1135    Assessment and plan: Acute on chronic diarrhea-likely due to chemotherapy and possible infectious enteritis.  Abdominal exam benign. Enteritis-CT raises concern for inflammatory or infectious enteritis. Possible partial SBO-noted on CT.  Exam normal.  LBM about 2 hours ago. -Continue ceftriaxone  and Flagyl - Clear liquid diet - Continue IV fluid - Follow surgery recommendation - Follow C. difficile and GIB - Check blood culture  Neutropenia/lymphocytopenia: Likely due to chemotherapy.  Seems like she has cycle 2 of a chemotherapy on 10/27.  Worsening leukocytosis since his second cycle. - Antibiotics as above - Add oncology to treatment team  Stage IIa Anal cancer s/p radiation.  Currently on chemotherapy. - Add oncology to treatment  Tobacco use disorder: Reports smoking about 5 cigarettes a day - Encouraged smoking cessation - Nicotine  patch  Normocytic anemia: Stable. - Continue monitoring  Hyperglycemia: No history of diabetes - Check hemoglobin A1c  Hyponatremia: Mild - Continue monitoring  Depression: Stable - Continue home meds after med rec  Pyuria with microscopic hematuria: Patient attributed dysuria to radiation. - Antibiotics as above  DVT prophylaxis: Subcu Lovenox   Code Status: Full  code-discussed with patient Family Communication: Updated daughter-in-law at bedside  Consults called: Hematology/oncology Admission status: Inpatient   Mignon ONEIDA Bump MD [facility]  If 7PM-7AM, please contact night-coverage [email or web address]  12/22/2023, 6:48 PM

## 2023-12-22 NOTE — ED Notes (Addendum)
 Pt is working on clean urine sample and going #2 also. 2 inserts in commode.

## 2023-12-22 NOTE — Plan of Care (Signed)
  Problem: Education: Goal: Knowledge of General Education information will improve Description: Including pain rating scale, medication(s)/side effects and non-pharmacologic comfort measures Outcome: Progressing   Problem: Activity: Goal: Risk for activity intolerance will decrease Outcome: Progressing   Problem: Coping: Goal: Level of anxiety will decrease Outcome: Progressing   Problem: Elimination: Goal: Will not experience complications related to bowel motility Outcome: Progressing   Problem: Pain Managment: Goal: General experience of comfort will improve and/or be controlled Outcome: Progressing

## 2023-12-22 NOTE — ED Notes (Signed)
 Called Lauren at [facility] for transport

## 2023-12-22 NOTE — ED Notes (Signed)
 Patient transported to CT

## 2023-12-22 NOTE — Consult Note (Signed)
 Reason for Consult:diarrhea Referring Physician: Dr. Gonfa  Erin [name] is an 73 y.o. female.  HPI: The patient is a 73 year old white female who presents to the emergency department with diarrhea that has been going on for over a week.  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  She has been having profuse diarrhea since then.  She denies any nausea or vomiting.  She denies any abdominal pain.  She underwent a CT scan which did show a long segment of thickened bowel consistent with an enteritis that could be causing a partial obstruction.  She is neutropenic from the chemotherapy.  Past Medical History:  Diagnosis Date   Alcohol abuse 11/08/2016   Alcoholism (HCC)    Depression    Hyperlipemia     Past Surgical History:  Procedure Laterality Date   NOSE SURGERY      Family History  Problem Relation Age of Onset   Heart failure Father     Social History:  reports that she has been smoking cigarettes. She has never used smokeless tobacco. She reports current alcohol use. She reports that she does not use drugs.  Allergies:  Allergies  Allergen Reactions   Lamictal [Lamotrigine] Swelling and Other (See Comments)    Caused swelling of the face    Medications: I have reviewed the patient's current medications.  Results for orders placed or performed during the hospital encounter of 12/22/23 (from the past 48 hours)  CBC with Differential     Status: Abnormal   Collection Time: 12/22/23 10:45 AM  Result Value Ref Range   WBC 1.5 (L) 4.0 - 10.5 K/uL   RBC 3.61 (L) 3.87 - 5.11 MIL/uL   Hemoglobin 10.6 (L) 12.0 - 15.0 g/dL   HCT 68.9 (L) 63.9 - 53.9 %   MCV 85.9 80.0 - 100.0 fL   MCH 29.4 26.0 - 34.0 pg   MCHC 34.2 30.0 - 36.0 g/dL   RDW 81.1 (H) 88.4 - 84.4 %   Platelets 159 150 - 400 K/uL   nRBC 0.0 0.0 - 0.2 %   Neutrophils Relative % 43 %    Comment: REPEATED TO VERIFY   Neutro Abs 0.6 (L) 1.7 - 7.7 K/uL   Lymphocytes Relative 6 %    Lymphs Abs 0.1 (L) 0.7 - 4.0 K/uL   Monocytes Relative 49 %   Monocytes Absolute 0.7 0.1 - 1.0 K/uL   Eosinophils Relative 1 %   Eosinophils Absolute 0.0 0.0 - 0.5 K/uL   Basophils Relative 0 %   Basophils Absolute 0.0 0.0 - 0.1 K/uL   Immature Granulocytes 1 %   Abs Immature Granulocytes 0.01 0.00 - 0.07 K/uL    Comment: Performed at [facility], [address], [place], [place] [address]  Comprehensive metabolic panel     Status: Abnormal   Collection Time: 12/22/23 10:45 AM  Result Value Ref Range   Sodium 132 (L) 135 - 145 mmol/L   Potassium 3.7 3.5 - 5.1 mmol/L   Chloride 96 (L) 98 - 111 mmol/L   CO2 25 22 - 32 mmol/L   Glucose, Bld 141 (H) 70 - 99 mg/dL    Comment: Glucose reference range applies only to samples taken after fasting for at least 8 hours.   BUN 11 8 - 23 mg/dL   Creatinine, Ser 9.22 0.44 - 1.00 mg/dL   Calcium  9.4 8.9 - 10.3 mg/dL   Total Protein 6.2 (L) 6.5 - 8.1 g/dL  Albumin 3.5 3.5 - 5.0 g/dL   AST 21 15 - 41 U/L    Comment: HEMOLYSIS AT THIS LEVEL MAY AFFECT RESULT   ALT 20 0 - 44 U/L   Alkaline Phosphatase 92 38 - 126 U/L   Total Bilirubin 0.6 0.0 - 1.2 mg/dL   GFR, Estimated >39 >39 mL/min    Comment: (NOTE) Calculated using the CKD-EPI Creatinine Equation (2021)    Anion gap 11 5 - 15    Comment: Performed at [facility], [address] [place], [place] [address]  Lipase, blood     Status: None   Collection Time: 12/22/23 10:45 AM  Result Value Ref Range   Lipase 11 11 - 51 U/L    Comment: Performed at [facility], [address] [place], [place] [address]  Lactic acid, plasma     Status: None   Collection Time: 12/22/23 10:45 AM  Result Value Ref Range   Lactic Acid, Venous 0.6 0.5 - 1.9 mmol/L    Comment: Performed at [facility], [address] [place], [place] [address]  Urinalysis, Routine w reflex microscopic -Urine, Clean Catch     Status: Abnormal    Collection Time: 12/22/23 11:35 AM  Result Value Ref Range   Color, Urine YELLOW YELLOW   APPearance CLOUDY (A) CLEAR   Specific Gravity, Urine 1.020 1.005 - 1.030   pH 6.5 5.0 - 8.0   Glucose, UA NEGATIVE NEGATIVE mg/dL   Hgb urine dipstick MODERATE (A) NEGATIVE   Bilirubin Urine NEGATIVE NEGATIVE   Ketones, ur NEGATIVE NEGATIVE mg/dL   Protein, ur 30 (A) NEGATIVE mg/dL   Nitrite NEGATIVE NEGATIVE   Leukocytes,Ua LARGE (A) NEGATIVE   RBC / HPF 21-50 0 - 5 RBC/hpf   WBC, UA >50 0 - 5 WBC/hpf   Bacteria, UA RARE (A) NONE SEEN   Squamous Epithelial / HPF 0-5 0 - 5 /HPF   WBC Clumps PRESENT    Mucus PRESENT    Hyaline Casts, UA PRESENT    Amorphous Crystal PRESENT     Comment: Performed at [facility], [address] [place], [place], [place] [address]  Lactic acid, plasma     Status: None   Collection Time: 12/22/23 12:31 PM  Result Value Ref Range   Lactic Acid, Venous 1.4 0.5 - 1.9 mmol/L    Comment: Performed at [facility], [address], [place], [place] [address]  C Difficile Quick Screen w PCR reflex     Status: None   Collection Time: 12/22/23  6:14 PM   Specimen: Stool  Result Value Ref Range   C Diff antigen NEGATIVE NEGATIVE   C Diff toxin NEGATIVE NEGATIVE   C Diff interpretation No C. difficile detected.     Comment: Performed at [facility], [address], [place], [place] [address]    CT ABDOMEN PELVIS W CONTRAST Result Date: 12/22/2023 EXAM: CT ABDOMEN AND PELVIS WITH CONTRAST 12/22/2023 12:10:57 PM TECHNIQUE: CT of the abdomen and pelvis was performed with the administration of 75 mL of iohexol  (OMNIPAQUE ) 300 MG/ML solution. Multiplanar reformatted images are provided for review. Automated exposure control, iterative reconstruction, and/or weight-based adjustment of the mA/kV was utilized to reduce the radiation dose to as low as reasonably achievable. COMPARISON: Unchanged from previous exam. CLINICAL  HISTORY: Abdominal pain, acute, nonlocalized. FINDINGS: LOWER CHEST: Coronary artery calcifications. LIVER: Cyst within the right lobe of the liver measures 4.8 cm (image 20/3). Unchanged from previous exam. GALLBLADDER AND  BILE DUCTS: Gallbladder is unremarkable. No biliary ductal dilatation. SPLEEN: The spleen is within normal limits in size and appearance. PANCREAS: The pancreas is normal in size and contour without focal lesion or ductal dilatation. ADRENAL GLANDS: Normal size and morphology bilaterally. No nodule, thickening, or hemorrhage. No periadrenal stranding. KIDNEYS, URETERS AND BLADDER: No stones in the kidneys or ureters. No hydronephrosis. No perinephric or periureteral stranding. Urinary bladder is unremarkable. GI AND BOWEL: Normal stomach. Dilated proximal and mid small bowel loops with scattered air-fluid levels measure up to 3.9 cm (image 54/3). Just before the terminal ileum there is a long segment of small bowel with abnormal wall thickening with surrounding congestion of the small bowel mesentery, soft tissue stranding and fluid. No pneumatosis. PERITONEUM AND RETROPERITONEUM: Small to moderate volume of free fluid noted within the pelvis. No fluid loculation identified. No signs of pneumoperitoneum. VASCULATURE: Aorta is normal in caliber. Aortic atherosclerotic calcifications. Abdominal vasculature is patent. LYMPH NODES: No abdominopelvic adenopathy. REPRODUCTIVE ORGANS: Uterus and adnexal structures are normal. Uterus appears normal. No adnexal mass. BONES AND SOFT TISSUES: No acute or suspicious osseous findings. No focal soft tissue abnormality. IMPRESSION: 1. Long segment small-bowel inflammation with wall thickening and surrounding mesenteric congestion and fluid near the terminal ileum, without pneumatosis. Imaging findings are compatible with an inflammatory or infectious enteritis. 2. Small-bowel obstruction pattern with dilated proximal and mid small-bowel loops and air-fluid  levels measuring up to 3.9 cm. 3. Small to moderate volume free fluid in the pelvis without loculation. Electronically signed by: Waddell Calk MD 12/22/2023 12:31 PM EST RP Workstation: [id number]    Review of Systems  Constitutional: Negative.   HENT: Negative.    Eyes: Negative.   Respiratory: Negative.    Cardiovascular: Negative.   Gastrointestinal:  Positive for diarrhea.  Endocrine: Negative.   Genitourinary: Negative.   Musculoskeletal: Negative.   Skin: Negative.   Allergic/Immunologic: Negative.   Neurological: Negative.   Hematological: Negative.   Psychiatric/Behavioral: Negative.     Blood pressure (!) 140/64, pulse 94, temperature 99.5 F (37.5 C), temperature source Oral, resp. rate 18, SpO2 93%. Physical Exam Vitals reviewed.  Constitutional:      General: She is not in acute distress.    Appearance: Normal appearance.  HENT:     Head: Normocephalic and atraumatic.     Right Ear: External ear normal.     Left Ear: External ear normal.     Nose: Nose normal.     Mouth/Throat:     Mouth: Mucous membranes are moist.     Pharynx: Oropharynx is clear.  Eyes:     Extraocular Movements: Extraocular movements intact.     Conjunctiva/sclera: Conjunctivae normal.     Pupils: Pupils are equal, round, and reactive to light.  Cardiovascular:     Rate and Rhythm: Normal rate and regular rhythm.     Pulses: Normal pulses.     Heart sounds: Normal heart sounds.  Pulmonary:     Effort: Pulmonary effort is normal. No respiratory distress.     Breath sounds: Normal breath sounds.  Abdominal:     General: Abdomen is flat.     Palpations: Abdomen is soft.     Tenderness: There is no abdominal tenderness.  Musculoskeletal:        General: No swelling or deformity. Normal range of motion.     Cervical back: Normal range of motion and neck supple.  Skin:    General: Skin is warm and dry.     Coloration:  Skin is not jaundiced.  Neurological:     General: No focal  deficit present.     Mental Status: She is alert and oriented to person, place, and time.  Psychiatric:        Mood and Affect: Mood normal.        Behavior: Behavior normal.     Assessment/Plan: The patient appears to have a partial bowel obstruction related to an area of bowel thickening likely from an infectious enteritis related to her recent chemotherapy and neutropenia.  At this point I would recommend bowel rest and broad-spectrum antibiotic therapy.  She does not appear to have an indication for urgent surgery tonight.  We will follow her closely with you.  Deward Null III 12/22/2023, 9:12 PM

## 2023-12-22 NOTE — ED Notes (Addendum)
 Urinalysis that was collected at 1231 was run in error by the lab off of the stool sample that was sent at 1231.  Urine sample sent at 1231 did not have enough volume to run a urinalysis test on.  Pt needs collection of new urine sample and advised as such.  Bedside commode in place.  Lab advised to run C-Diff test as ordered off of stool sample that was collected at 1231.

## 2023-12-23 ENCOUNTER — Encounter (HOSPITAL_COMMUNITY): Payer: Self-pay

## 2023-12-23 DIAGNOSIS — T451X5A Adverse effect of antineoplastic and immunosuppressive drugs, initial encounter: Secondary | ICD-10-CM | POA: Diagnosis not present

## 2023-12-23 DIAGNOSIS — D701 Agranulocytosis secondary to cancer chemotherapy: Secondary | ICD-10-CM | POA: Diagnosis not present

## 2023-12-23 DIAGNOSIS — Z720 Tobacco use: Secondary | ICD-10-CM

## 2023-12-23 DIAGNOSIS — K529 Noninfective gastroenteritis and colitis, unspecified: Secondary | ICD-10-CM | POA: Insufficient documentation

## 2023-12-23 DIAGNOSIS — C210 Malignant neoplasm of anus, unspecified: Secondary | ICD-10-CM

## 2023-12-23 LAB — COMPREHENSIVE METABOLIC PANEL WITH GFR
ALT: 19 U/L (ref 0–44)
AST: 21 U/L (ref 15–41)
Albumin: 2.8 g/dL — ABNORMAL LOW (ref 3.5–5.0)
Alkaline Phosphatase: 81 U/L (ref 38–126)
Anion gap: 8 (ref 5–15)
BUN: 8 mg/dL (ref 8–23)
CO2: 25 mmol/L (ref 22–32)
Calcium: 8 mg/dL — ABNORMAL LOW (ref 8.9–10.3)
Chloride: 99 mmol/L (ref 98–111)
Creatinine, Ser: 0.64 mg/dL (ref 0.44–1.00)
GFR, Estimated: >60 mL/min (ref >60)
Glucose, Bld: 123 mg/dL — ABNORMAL HIGH (ref 70–99)
Potassium: 3.3 mmol/L — ABNORMAL LOW (ref 3.5–5.1)
Sodium: 132 mmol/L — ABNORMAL LOW (ref 135–145)
Total Bilirubin: 0.4 mg/dL (ref 0.0–1.2)
Total Protein: 5.2 g/dL — ABNORMAL LOW (ref 6.5–8.1)

## 2023-12-23 LAB — CBC
HCT: 26.7 % — ABNORMAL LOW (ref 36.0–46.0)
Hemoglobin: 9 g/dL — ABNORMAL LOW (ref 12.0–15.0)
MCH: 29.5 pg (ref 26.0–34.0)
MCHC: 33.7 g/dL (ref 30.0–36.0)
MCV: 87.5 fL (ref 80.0–100.0)
Platelets: 135 K/uL — ABNORMAL LOW (ref 150–400)
RBC: 3.05 MIL/uL — ABNORMAL LOW (ref 3.87–5.11)
RDW: 19.2 % — ABNORMAL HIGH (ref 11.5–15.5)
WBC: 2.2 K/uL — ABNORMAL LOW (ref 4.0–10.5)
nRBC: 0 % (ref 0.0–0.2)

## 2023-12-23 LAB — PREALBUMIN: Prealbumin: <5 mg/dL — ABNORMAL LOW (ref 18–38)

## 2023-12-23 LAB — GASTROINTESTINAL PANEL BY PCR, STOOL (REPLACES STOOL CULTURE)

## 2023-12-23 LAB — MAGNESIUM: Magnesium: 1.9 mg/dL (ref 1.7–2.4)

## 2023-12-23 LAB — HEMOGLOBIN A1C
Hgb A1c MFr Bld: 5.9 % — ABNORMAL HIGH (ref 4.8–5.6)
Mean Plasma Glucose: 122.63 mg/dL

## 2023-12-23 LAB — C DIFFICILE QUICK SCREEN W PCR REFLEX
C Diff antigen: NEGATIVE
C Diff interpretation: NOT DETECTED
C Diff toxin: NEGATIVE

## 2023-12-23 LAB — PHOSPHORUS: Phosphorus: 2.4 mg/dL — ABNORMAL LOW (ref 2.5–4.6)

## 2023-12-23 MED ORDER — POTASSIUM CHLORIDE CRYS ER 20 MEQ PO TBCR
40.0000 meq | EXTENDED_RELEASE_TABLET | ORAL | Status: AC
  Administered 2023-12-23 (×2): 40 meq via ORAL
  Filled 2023-12-23 (×2): qty 2

## 2023-12-23 MED ORDER — ACETAMINOPHEN 325 MG PO TABS
650.0000 mg | ORAL_TABLET | Freq: Once | ORAL | Status: AC
  Administered 2023-12-23: 650 mg via ORAL
  Filled 2023-12-23: qty 2

## 2023-12-23 MED ORDER — ACETAMINOPHEN 325 MG PO TABS: 650.0000 mg | ORAL_TABLET | Freq: Four times a day (QID) | ORAL | Status: DC | PRN

## 2023-12-23 NOTE — Progress Notes (Addendum)
 12/23/2023  Erin [name] [id number] 1950/05/23  CARE TEAM: PCP: Erin Camie Pepper, PA-C  Outpatient Care Team: Patient Care Team: Erin [name] Erin [name] as PCP - General (Physician Assistant) [name], Erin SQUIBB, RN as Oncology Nurse Navigator Erin Arley NOVAK, MD as Consulting Physician (Oncology) Erin Hila, MD as Consulting Physician (Colon and Rectal Surgery) Erin Donald Stagger, PA-C as Physician Assistant (Radiation Oncology)  Inpatient Treatment Team: Treatment Team:  [name], Erin T, MD Ccs, Md, MD Erin Arley NOVAK, MD Erin Cartwright, MD Erin [name], PT [name], Sarala, RN Erin [name], NT [name], Erin HERO, RN Erin Leas I, LCSW   Problem List:   Principal Problem:   Enteritis Active Problems:   Tobacco abuse   Anal cancer (HCC)   SBO (small bowel obstruction) (HCC)   Chemotherapy induced neutropenia   * No surgery found *      Assessment [facility] Stay = 1 days)      Ileal thickening with obstructive symptoms most likely related to infectious or inflammatory etiology.  Appears to be gradually improving   Plan:  No nausea or vomiting tolerating clears.  Full liquids advance to soft as tolerated.  Would go a little slowly just in case.  IV fluid resuscitation.  Agree with GI infectious workup.  CT negative.  GI panel pending.  Defer to medicine.  Consider gastroenterology consultation if there are further concerns or atypical infection noted.  She is immunosuppressed with chemotherapy neutropenic so may need to look for zebras more than just horses.   Supportive care.  NG tube if worse.  Neutropenia recent chemotherapy.  Consider getting medical and following the  She has no evidence of any adhesive disease or perforation or peritonitis to warrant surgical intervention.  If persistent symptoms, consider seeing gastroenterology to help manage the enteritis and the irregular bowels.  -monitor  electrolytes & replace as needed  Keep K>4, Mg>2, Phos>3.  Potassium supplementation already ordered by medicine.  Checking magnesium .  Check phosphorus as well probably chronically malnourished  -VTE prophylaxis- SCDs.  Anticoagulation prophyllaxis SQ as appropriate  -mobilize as tolerated to help recovery.  Enlist therapies in moderate/high risk patients as appropriate  [name] updated the patient's status to the patient, nburse, and Dr Kathrin.   Recommendations were made.  Questions were answered.  They expressed understanding & appreciation.  -Disposition: The patient is stable.  There is no evidence of peritonitis, acute abdomen, nor shock.  There is no strong evidence of failure of improvement nor decline with current non-operative management.  There is no need for surgery at the present moment.  We will continue to follow.     [name] reviewed nursing notes, ED provider notes, hospitalist notes, last 24 h vitals and pain scores, last 48 h intake and output, last 24 h labs and trends, and last 24 h imaging results.  [name] have reviewed this patient's available data, including medical history, events of note, test results, etc as part of my evaluation.   A significant portion of that time was spent in counseling. Care during the described time interval was provided by me.  This care required moderate level of medical decision making.  12/23/2023    Subjective: (Chief complaint)  Feeling better overall.  No nausea vomiting..  Having loose bowel movements.  Dr. Kathrin present in room.  Objective:  Vital signs:  Vitals:   12/22/23 2059 12/23/23 0127 12/23/23 0604 12/23/23 0837  BP: (!) 140/64 133/70 106/60 (!) 140/76  Pulse: 94 95 92 (!)  104  Resp: 18 18 20 16   Temp: 99.5 F (37.5 C) 99.4 F (37.4 C) 100.2 F (37.9 C) 98.9 F (37.2 C)  TempSrc: Oral Oral Oral Oral  SpO2: 93% 94% 93% 96%    Last BM Date : 12/23/23  Intake/Output   Yesterday:  11/08 0701 - 11/09 0700 In: 2380.7  [P.O.:720; [name].V.:804.7; IV Piggyback:856] Out: -  This shift:  No intake/output data recorded.  Bowel function:  Flatus: YES  BM:  YES  Drain: (No drain)   Physical Exam:  General: Pt awake/alert in no acute distress Eyes: PERRL, normal EOM.  Sclera clear.  No icterus Neuro: CN II-XII intact w/o focal sensory/motor deficits. Lymph: No head/neck/groin lymphadenopathy Psych:  No delerium/psychosis/paranoia.  Oriented x 4 HENT: Normocephalic, Mucus membranes moist.  No thrush Neck: Supple, No tracheal deviation.  No obvious thyromegaly Chest: No pain to chest wall compression.  Good respiratory excursion.  No audible wheezing CV:  Pulses intact.  Regular rhythm.  No major extremity edema MS: Normal AROM mjr joints.  No obvious deformity  Abdomen: Soft.  Nondistended.  Nontender.  No evidence of peritonitis.  No incarcerated hernias.  Ext:   No deformity.  No mjr edema.  No cyanosis Skin: No petechiae / purpurea.  No major sores.  Warm and dry    Results:   Cultures: Recent Results (from the past 720 hours)  Culture, blood (Routine x 2)     Status: None   Collection Time: 11/26/23  6:40 PM   Specimen: BLOOD  Result Value Ref Range Status   Specimen Description   Final    BLOOD RIGHT ANTECUBITAL Performed at [facility], [address] [place], [place] [address]    Special Requests   Final    BOTTLES DRAWN AEROBIC AND ANAEROBIC Blood Culture adequate volume Performed at [facility], [address] [place], [place] [address]    Culture   Final    NO GROWTH 5 DAYS Performed at [facility], [address] [place], [place] [address]    Report Status 12/01/2023 FINAL  Final  Culture, blood (Routine x 2)     Status: None   Collection Time: 11/26/23  7:23 PM   Specimen: BLOOD RIGHT WRIST  Result Value Ref Range Status   Specimen Description   Final    BLOOD RIGHT WRIST Performed at [facility], [address], [place], [place] [address]    Special Requests   Final    BOTTLES DRAWN AEROBIC ONLY Blood Culture results may not be optimal due to an inadequate volume of blood received in culture bottles Performed at [facility], [address], [place], [place] [address]    Culture   Final    NO GROWTH 5 DAYS Performed at [facility], [address] [place], [place] [address]    Report Status 12/01/2023 FINAL  Final  C Difficile Quick Screen w PCR reflex     Status: None   Collection Time: 12/22/23  6:14 PM   Specimen: Stool  Result Value Ref Range Status   C Diff antigen NEGATIVE NEGATIVE Final   C Diff toxin NEGATIVE NEGATIVE Final   C Diff interpretation No C. difficile detected.  Final    Comment: Performed at [facility], [address] [place], [place] [address]  Culture, blood (Routine X 2) w Reflex to ID Panel     Status: None (Preliminary result)   Collection Time:  12/22/23  7:14 PM   Specimen: BLOOD RIGHT HAND  Result Value Ref Range Status   Specimen Description BLOOD RIGHT HAND  Final   Special Requests   Final    BOTTLES DRAWN AEROBIC ONLY Blood Culture adequate volume   Culture   Final    NO GROWTH < 12 HOURS Performed at [facility], [address] [place], [place] [address]    Report Status PENDING  Incomplete  Culture, blood (Routine X 2) w Reflex to ID Panel     Status: None (Preliminary result)   Collection Time: 12/22/23  7:14 PM   Specimen: BLOOD RIGHT HAND  Result Value Ref Range Status   Specimen Description BLOOD RIGHT HAND  Final   Special Requests   Final    BOTTLES DRAWN AEROBIC ONLY Blood Culture adequate volume   Culture   Final    NO GROWTH < 12 HOURS Performed at [facility], [address], [place], [place] [address]    Report Status PENDING  Incomplete    Labs: Results for orders placed or performed during the hospital encounter of 12/22/23 (from the past 48 hours)  CBC with  Differential     Status: Abnormal   Collection Time: 12/22/23 10:45 AM  Result Value Ref Range   WBC 1.5 (L) 4.0 - 10.5 K/uL   RBC 3.61 (L) 3.87 - 5.11 MIL/uL   Hemoglobin 10.6 (L) 12.0 - 15.0 g/dL   HCT 68.9 (L) 63.9 - 53.9 %   MCV 85.9 80.0 - 100.0 fL   MCH 29.4 26.0 - 34.0 pg   MCHC 34.2 30.0 - 36.0 g/dL   RDW 81.1 (H) 88.4 - 84.4 %   Platelets 159 150 - 400 K/uL   nRBC 0.0 0.0 - 0.2 %   Neutrophils Relative % 43 %    Comment: REPEATED TO VERIFY   Neutro Abs 0.6 (L) 1.7 - 7.7 K/uL   Lymphocytes Relative 6 %   Lymphs Abs 0.1 (L) 0.7 - 4.0 K/uL   Monocytes Relative 49 %   Monocytes Absolute 0.7 0.1 - 1.0 K/uL   Eosinophils Relative 1 %   Eosinophils Absolute 0.0 0.0 - 0.5 K/uL   Basophils Relative 0 %   Basophils Absolute 0.0 0.0 - 0.1 K/uL   Immature Granulocytes 1 %   Abs Immature Granulocytes 0.01 0.00 - 0.07 K/uL    Comment: Performed at [facility], [address] [place], [place] [address]  Comprehensive metabolic panel     Status: Abnormal   Collection Time: 12/22/23 10:45 AM  Result Value Ref Range   Sodium 132 (L) 135 - 145 mmol/L   Potassium 3.7 3.5 - 5.1 mmol/L   Chloride 96 (L) 98 - 111 mmol/L   CO2 25 22 - 32 mmol/L   Glucose, Bld 141 (H) 70 - 99 mg/dL    Comment: Glucose reference range applies only to samples taken after fasting for at least 8 hours.   BUN 11 8 - 23 mg/dL   Creatinine, Ser 9.22 0.44 - 1.00 mg/dL   Calcium  9.4 8.9 - 10.3 mg/dL   Total Protein 6.2 (L) 6.5 - 8.1 g/dL   Albumin 3.5 3.5 - 5.0 g/dL   AST 21 15 - 41 U/L    Comment: HEMOLYSIS AT THIS LEVEL MAY AFFECT RESULT   ALT 20 0 - 44 U/L   Alkaline Phosphatase 92 38 - 126 U/L   Total Bilirubin 0.6 0.0 - 1.2 mg/dL  GFR, Estimated >60 >60 mL/min    Comment: (NOTE) Calculated using the CKD-EPI Creatinine Equation (2021)    Anion gap 11 5 - 15    Comment: Performed at [facility], [address] [place], [place] [address]  Lipase, blood      Status: None   Collection Time: 12/22/23 10:45 AM  Result Value Ref Range   Lipase 11 11 - 51 U/L    Comment: Performed at [facility], [address], [place], [place] [address]  Lactic acid, plasma     Status: None   Collection Time: 12/22/23 10:45 AM  Result Value Ref Range   Lactic Acid, Venous 0.6 0.5 - 1.9 mmol/L    Comment: Performed at [facility], [address], [place], [place] [address]  Urinalysis, Routine w reflex microscopic -Urine, Clean Catch     Status: Abnormal   Collection Time: 12/22/23 11:35 AM  Result Value Ref Range   Color, Urine YELLOW YELLOW   APPearance CLOUDY (A) CLEAR   Specific Gravity, Urine 1.020 1.005 - 1.030   pH 6.5 5.0 - 8.0   Glucose, UA NEGATIVE NEGATIVE mg/dL   Hgb urine dipstick MODERATE (A) NEGATIVE   Bilirubin Urine NEGATIVE NEGATIVE   Ketones, ur NEGATIVE NEGATIVE mg/dL   Protein, ur 30 (A) NEGATIVE mg/dL   Nitrite NEGATIVE NEGATIVE   Leukocytes,Ua LARGE (A) NEGATIVE   RBC / HPF 21-50 0 - 5 RBC/hpf   WBC, UA >50 0 - 5 WBC/hpf   Bacteria, UA RARE (A) NONE SEEN   Squamous Epithelial / HPF 0-5 0 - 5 /HPF   WBC Clumps PRESENT    Mucus PRESENT    Hyaline Casts, UA PRESENT    Amorphous Crystal PRESENT     Comment: Performed at [facility], [address] [place], [place], [place] [address]  Lactic acid, plasma     Status: None   Collection Time: 12/22/23 12:31 PM  Result Value Ref Range   Lactic Acid, Venous 1.4 0.5 - 1.9 mmol/L    Comment: Performed at [facility], [address], [place], [place] [address]  C Difficile Quick Screen w PCR reflex     Status: None   Collection Time: 12/22/23  6:14 PM   Specimen: Stool  Result Value Ref Range   C Diff antigen NEGATIVE NEGATIVE   C Diff toxin NEGATIVE NEGATIVE   C Diff interpretation No C. difficile detected.     Comment: Performed at [facility], [address], [place], [place] [address]   Culture, blood (Routine X 2) w Reflex to ID Panel     Status: None (Preliminary result)   Collection Time: 12/22/23  7:14 PM   Specimen: BLOOD RIGHT HAND  Result Value Ref Range   Specimen Description BLOOD RIGHT HAND    Special Requests      BOTTLES DRAWN AEROBIC ONLY Blood Culture adequate volume   Culture      NO GROWTH < 12 HOURS Performed at [facility], [address] [place], [place] [address]    Report Status PENDING   Culture, blood (Routine X 2) w Reflex to ID Panel     Status: None (Preliminary result)   Collection Time: 12/22/23  7:14 PM   Specimen: BLOOD RIGHT HAND  Result Value Ref Range   Specimen Description BLOOD RIGHT HAND    Special Requests      BOTTLES DRAWN AEROBIC ONLY Blood Culture adequate volume   Culture  NO GROWTH < 12 HOURS Performed at [facility], [address], [place], [place] [address]    Report Status PENDING   Comprehensive metabolic panel     Status: Abnormal   Collection Time: 12/23/23  5:41 AM  Result Value Ref Range   Sodium 132 (L) 135 - 145 mmol/L   Potassium 3.3 (L) 3.5 - 5.1 mmol/L   Chloride 99 98 - 111 mmol/L   CO2 25 22 - 32 mmol/L   Glucose, Bld 123 (H) 70 - 99 mg/dL    Comment: Glucose reference range applies only to samples taken after fasting for at least 8 hours.   BUN 8 8 - 23 mg/dL   Creatinine, Ser 9.35 0.44 - 1.00 mg/dL   Calcium  8.0 (L) 8.9 - 10.3 mg/dL   Total Protein 5.2 (L) 6.5 - 8.1 g/dL   Albumin 2.8 (L) 3.5 - 5.0 g/dL   AST 21 15 - 41 U/L   ALT 19 0 - 44 U/L   Alkaline Phosphatase 81 38 - 126 U/L   Total Bilirubin 0.4 0.0 - 1.2 mg/dL   GFR, Estimated >39 >39 mL/min    Comment: (NOTE) Calculated using the CKD-EPI Creatinine Equation (2021)    Anion gap 8 5 - 15    Comment: Performed at [facility], [address] [place] [address], [place], [place] [address]  CBC     Status: Abnormal   Collection Time: 12/23/23  5:41 AM  Result Value Ref Range   WBC 2.2 (L) 4.0 - 10.5 K/uL   RBC  3.05 (L) 3.87 - 5.11 MIL/uL   Hemoglobin 9.0 (L) 12.0 - 15.0 g/dL   HCT 73.2 (L) 63.9 - 53.9 %   MCV 87.5 80.0 - 100.0 fL   MCH 29.5 26.0 - 34.0 pg   MCHC 33.7 30.0 - 36.0 g/dL   RDW 80.7 (H) 88.4 - 84.4 %   Platelets 135 (L) 150 - 400 K/uL   nRBC 0.0 0.0 - 0.2 %    Comment: Performed at [facility], [address] [place], [place] [address]  Magnesium      Status: None   Collection Time: 12/23/23  5:41 AM  Result Value Ref Range   Magnesium  1.9 1.7 - 2.4 mg/dL    Comment: Performed at [facility], [address] [place], [place] [address]  Phosphorus     Status: Abnormal   Collection Time: 12/23/23  5:41 AM  Result Value Ref Range   Phosphorus 2.4 (L) 2.5 - 4.6 mg/dL    Comment: Performed at [facility], [name] [place], [place] [address]    Imaging / Studies: CT ABDOMEN PELVIS W CONTRAST Result Date: 12/22/2023 EXAM: CT ABDOMEN AND PELVIS WITH CONTRAST 12/22/2023 12:10:57 PM TECHNIQUE: CT of the abdomen and pelvis was performed with the administration of 75 mL of iohexol  (OMNIPAQUE ) 300 MG/ML solution. Multiplanar reformatted images are provided for review. Automated exposure control, iterative reconstruction, and/or weight-based adjustment of the mA/kV was utilized to reduce the radiation dose to as low as reasonably achievable. COMPARISON: Unchanged from previous exam. CLINICAL HISTORY: Abdominal pain, acute, nonlocalized. FINDINGS: LOWER CHEST: Coronary artery calcifications. LIVER: Cyst within the right lobe of the liver measures 4.8 cm (image 20/3). Unchanged from previous exam. GALLBLADDER AND BILE DUCTS: Gallbladder is unremarkable. No biliary ductal dilatation. SPLEEN: The spleen is within normal limits in size and appearance. PANCREAS: The pancreas is normal in size and contour without focal lesion or ductal  dilatation. ADRENAL GLANDS: Normal size and morphology bilaterally. No nodule, thickening, or hemorrhage.  No periadrenal stranding. KIDNEYS, URETERS AND BLADDER: No stones in the kidneys or ureters. No hydronephrosis. No perinephric or periureteral stranding. Urinary bladder is unremarkable. GI AND BOWEL: Normal stomach. Dilated proximal and mid small bowel loops with scattered air-fluid levels measure up to 3.9 cm (image 54/3). Just before the terminal ileum there is a long segment of small bowel with abnormal wall thickening with surrounding congestion of the small bowel mesentery, soft tissue stranding and fluid. No pneumatosis. PERITONEUM AND RETROPERITONEUM: Small to moderate volume of free fluid noted within the pelvis. No fluid loculation identified. No signs of pneumoperitoneum. VASCULATURE: Aorta is normal in caliber. Aortic atherosclerotic calcifications. Abdominal vasculature is patent. LYMPH NODES: No abdominopelvic adenopathy. REPRODUCTIVE ORGANS: Uterus and adnexal structures are normal. Uterus appears normal. No adnexal mass. BONES AND SOFT TISSUES: No acute or suspicious osseous findings. No focal soft tissue abnormality. IMPRESSION: 1. Long segment small-bowel inflammation with wall thickening and surrounding mesenteric congestion and fluid near the terminal ileum, without pneumatosis. Imaging findings are compatible with an inflammatory or infectious enteritis. 2. Small-bowel obstruction pattern with dilated proximal and mid small-bowel loops and air-fluid levels measuring up to 3.9 cm. 3. Small to moderate volume free fluid in the pelvis without loculation. Electronically signed by: Waddell Calk MD 12/22/2023 12:31 PM EST RP Workstation: [id number]    Medications / Allergies: per chart  Antibiotics: Anti-infectives (From admission, onward)    Start     Dose/Rate Route Frequency Ordered Stop   12/23/23 0600  cefTRIAXone  (ROCEPHIN ) 2 g in sodium chloride  0.9 % 100 mL IVPB        2 g 200 mL/hr over 30 Minutes Intravenous Every 24 hours 12/22/23 1751     12/23/23 0000  cefTRIAXone  (ROCEPHIN )  2 g in sodium chloride  0.9 % 100 mL IVPB  Status:  Discontinued        2 g 200 mL/hr over 30 Minutes Intravenous  Once 12/22/23 1728 12/22/23 1750   12/23/23 0000  metroNIDAZOLE (FLAGYL) IVPB 500 mg        500 mg 100 mL/hr over 60 Minutes Intravenous Every 12 hours 12/22/23 1728     12/23/23 0000  cefTRIAXone  (ROCEPHIN ) 2 g in sodium chloride  0.9 % 100 mL IVPB  Status:  Discontinued        2 g 200 mL/hr over 30 Minutes Intravenous Every 24 hours 12/22/23 1750 12/22/23 1751   12/22/23 1315  cefTRIAXone  (ROCEPHIN ) 1 g in sodium chloride  0.9 % 100 mL IVPB        1 g 200 mL/hr over 30 Minutes Intravenous  Once 12/22/23 1301 12/22/23 1344   12/22/23 1315  metroNIDAZOLE (FLAGYL) IVPB 500 mg        500 mg 100 mL/hr over 60 Minutes Intravenous  Once 12/22/23 1301 12/22/23 1444         Note: Portions of this report may have been transcribed using voice recognition software. Every effort was made to ensure accuracy; however, inadvertent computerized transcription errors may be present.   Any transcriptional errors that result from this process are unintentional.    Elspeth KYM Schultze, MD, FACS, MASCRS Esophageal, Gastrointestinal & Colorectal Surgery Robotic and Minimally Invasive Surgery  [facility] [address] [place], [place] [address] [phone] Fax [phone] Main  CONTACT INFORMATION: Weekday (9AM-5PM): Call [facility] main office at [phone] Weeknight (5PM-9AM) or Weekend/Holiday: Check EPIC Web  Links tab & use AMION (password  [email or web address]) for General Surgery [facility] coverage  Please, DO NOT use SecureChat  (it is not reliable communication to reach operating surgeons & will lead to a delay in care).   Epic staff messaging available for outpatient concerns needing 1-2 business day response.      12/23/2023  10:11 AM

## 2023-12-23 NOTE — Plan of Care (Signed)

## 2023-12-23 NOTE — Evaluation (Signed)
 Physical Therapy Evaluation Patient Details Name: Erin [name] MRN: [id number] DOB: [birth year] Today's Date: 12/23/2023  History of Present Illness  Pt is a 73 y.o. female presenting with persistent diarrhea. CT raises concern for inflammatory or infectious enteritis. Possible partial SBO noted on CT. Past medical history significant for but not limited to anxiety/depression, HLD, alcohol abuse, and anal cancer currently on chemoradiation.   Clinical Impression  Pt is a 73 y.o. female with above HPI. Pt is typically independent at baseline without use of AD. Pt performed sit to stand transfers, ambulated ~56ft (pt deferred further distance), and performed x10 for stair negotiation with independence- close supervision for safety with stairs. Pt and therapist in agreement that pt is at baseline level of mobility. No further skilled PT needs identified. Discussed notifying care team if any changes in mobility status and occur. PT will sign off.             Equipment Recommendations None recommended by PT  Recommendations for Other Services       Functional Status Assessment Patient has not had a recent decline in their functional status     Precautions / Restrictions Precautions Precautions: Fall Restrictions Weight Bearing Restrictions Per Provider Order: No      Mobility  Bed Mobility               General bed mobility comments: Pt in recliner pre/ post session    Transfers Overall transfer level: Independent Equipment used: None                    Ambulation/Gait Ambulation/Gait assistance: Independent Gait Distance (Feet): 90 Feet Assistive device: None Gait Pattern/deviations: Step-through pattern Gait velocity: WNL     General Gait Details: no overt LOB observed.  Stairs Stairs: Yes Stairs assistance: Supervision Stair Management: One rail Right, Alternating pattern, Forwards Number of Stairs: 10    Wheelchair Mobility     Tilt  Bed    Modified Rankin (Stroke Patients Only)       Balance Overall balance assessment: No apparent balance deficits (not formally assessed)                                           Pertinent Vitals/Pain Pain Assessment Pain Assessment: No/denies pain    Home Living Family/patient expects to be discharged to:: Private residence Living Arrangements: Alone Available Help at Discharge: Family;Friend(s) (daughter and son) Type of Home: House Home Access: Stairs to enter Entrance Stairs-Rails: Right;Left;Can reach both Entrance Stairs-Number of Steps: 4 Alternate Level Stairs-Number of Steps: 10 Home Layout: Two level;Able to live on main level with bedroom/bathroom Home Equipment: None      Prior Function Prior Level of Function : Independent/Modified Independent;Driving             Mobility Comments: no AD at baseline       Extremity/Trunk Assessment   Upper Extremity Assessment Upper Extremity Assessment: Overall WFL for tasks assessed    Lower Extremity Assessment Lower Extremity Assessment: Overall WFL for tasks assessed    Cervical / Trunk Assessment Cervical / Trunk Assessment: Normal  Communication   Communication Communication: No apparent difficulties    Cognition Arousal: Alert Behavior During Therapy: WFL for tasks assessed/performed   PT - Cognitive impairments: No apparent impairments  Following commands: Intact       Cueing       General Comments      Exercises     Assessment/Plan    PT Assessment Patient does not need any further PT services  PT Problem List         PT Treatment Interventions      PT Goals (Current goals can be found in the Care Plan section)  Acute Rehab PT Goals Patient Stated Goal: be able to go home PT Goal Formulation: With patient Time For Goal Achievement: 01/06/24 Potential to Achieve Goals: Good    Frequency       Co-evaluation                AM-PAC PT 6 Clicks Mobility  Outcome Measure Help needed turning from your back to your side while in a flat bed without using bedrails?: None Help needed moving from lying on your back to sitting on the side of a flat bed without using bedrails?: None Help needed moving to and from a bed to a chair (including a wheelchair)?: None Help needed standing up from a chair using your arms (e.g., wheelchair or bedside chair)?: None Help needed to walk in hospital room?: None Help needed climbing 3-5 steps with a railing? : None 6 Click Score: 24    End of Session Equipment Utilized During Treatment: Gait belt Activity Tolerance: Patient tolerated treatment well Patient left: in chair;with call bell/phone within reach Nurse Communication: Mobility status PT Visit Diagnosis: Unsteadiness on feet (R26.81)    Time: 9066-9052 PT Time Calculation (min) (ACUTE ONLY): 14 min   Charges:   PT Evaluation $PT Eval Low Complexity: 1 Low   PT General Charges $$ ACUTE PT VISIT: 1 Visit         Tinnie BERRY PT, DPT  Acute Rehabilitation Services  Office [phone]  12/23/2023, 11:29 AM

## 2023-12-23 NOTE — Progress Notes (Signed)
 PROGRESS NOTE  Erin [name] FMW:[id number] DOB: May 13, 1950   PCP: [name], PA-C  Patient is from: Home.  Independently ambulates at baseline.  DOA: 12/22/2023 LOS: 1  Chief complaints Chief Complaint  Patient presents with   Diarrhea     Brief Narrative / Interim history:  73 y.o. female with PMH of oral cancer on chemotherapy and radiation, HTN, tobacco use disorder and depression presented to [facility] with worsening diarrhea, and admitted with acute on chronic diarrhea.  CT abdomen and pelvis concerning for enteritis and possible partial SBO.  General surgery consulted.  Started on ceftriaxone , Flagyl and IV fluids.  Next day, ongoing diarrhea.  C. difficile and blood cultures negative.  GIP pending.  Subjective: Seen and examined earlier this morning.  No major events overnight or this morning.  Continues to have diarrhea.  Some intermittent abdominal pain with movement and cough.  Denies shortness of breath.  Sitting on bedside chair.   Assessment and plan: Acute on chronic diarrhea-likely due to chemo and infectious enteritis.  Enteritis-CT raises concern for inflammatory or infectious enteritis. Possible partial SBO-noted on CT.  Exam normal.  LBM about 2 hours ago. -C. difficile negative.  Blood cultures NGTD. -Continue ceftriaxone  and Flagyl -Advance to full liquid diet. -Continue IV fluid -Follow GIB.  Will resume home Lomotil if GIB negative. -Appreciate help by general surgery   Neutropenia/lymphocytopenia: Likely due to chemotherapy.  Seems like she has cycle 2 of a chemotherapy on 10/27.  Worsening leukocytosis since his second cycle.  Improving. - Antibiotics as above - Added oncology to treatment team   Stage IIa Anal cancer s/p radiation.  Currently on chemotherapy. - Added oncology to treatment   Tobacco use disorder: Reports smoking about 5 cigarettes a day - Encouraged smoking cessation - Nicotine  patch   Pancytopenia:  Likely due to chemo. Slight drop in Hgb likely dilutional. - Continue monitoring   Hyperglycemia: No history of diabetes - Check hemoglobin A1c   Hyponatremia: Mild and stable. - Continue monitoring   Depression: Stable - Continue home meds after med rec  Hypokalemia -Monitor replenish K and Mg as appropriate   Pyuria with microscopic hematuria: Patient attributed dysuria to radiation. - Antibiotics as above   There is no height or weight on file to calculate BMI.           DVT prophylaxis:  enoxaparin  (LOVENOX ) injection 40 mg Start: 12/22/23 2200  Code Status: Full code Family Communication: None at bedside Level of care: Med-Surg Status is: Inpatient Remains inpatient appropriate because: Infectious diarrhea/enteritis   Final disposition: Home   55 minutes with more than 50% spent in reviewing records, counseling patient/family and coordinating care.  Consultants:  Hematology/oncology General Surgery  Procedures: None  Microbiology summarized: Blood cultures NGTD C. difficile negative GIP pending  Objective: Vitals:   12/22/23 2059 12/23/23 0127 12/23/23 0604 12/23/23 0837  BP: (!) 140/64 133/70 106/60 (!) 140/76  Pulse: 94 95 92 (!) 104  Resp: 18 18 20 16   Temp: 99.5 F (37.5 C) 99.4 F (37.4 C) 100.2 F (37.9 C) 98.9 F (37.2 C)  TempSrc: Oral Oral Oral Oral  SpO2: 93% 94% 93% 96%    Examination:  GENERAL: No apparent distress.  Nontoxic. HEENT: MMM.  Vision and hearing grossly intact.  NECK: Supple.  No apparent JVD.  RESP:  No IWOB.  Fair aeration bilaterally. CVS:  RRR. Heart sounds normal.  ABD/GI/GU: BS+. Abd soft, NTND.  MSK/EXT:  Moves extremities. No apparent deformity.  No edema.  SKIN: no apparent skin lesion or wound NEURO: AA.  Oriented appropriately.  No apparent focal neuro deficit. PSYCH: Calm. Normal affect.   Sch Meds:  Scheduled Meds:  atorvastatin   80 mg Oral QHS   buPROPion   150 mg Oral q morning   busPIRone   10 mg Oral Daily   And   busPIRone  5 mg Oral QHS   cloNIDine   0.1 mg Oral QHS   enoxaparin  (LOVENOX ) injection  40 mg Subcutaneous Q24H   FLUoxetine   40 mg Oral Daily   hydrOXYzine   50 mg Oral QHS   lurasidone   40 mg Oral QHS   nicotine   14 mg Transdermal Daily   potassium chloride   40 mEq Oral Q3H   Continuous Infusions:  cefTRIAXone  (ROCEPHIN )  IV 2 g (12/23/23 0503)   lactated ringers  75 mL/hr at 12/23/23 0502   metronidazole 500 mg (12/23/23 0001)   PRN Meds:.acetaminophen , ALPRAZolam, HYDROcodone -acetaminophen   Antimicrobials: Anti-infectives (From admission, onward)    Start     Dose/Rate Route Frequency Ordered Stop   12/23/23 0600  cefTRIAXone  (ROCEPHIN ) 2 g in sodium chloride  0.9 % 100 mL IVPB        2 g 200 mL/hr over 30 Minutes Intravenous Every 24 hours 12/22/23 1751     12/23/23 0000  cefTRIAXone  (ROCEPHIN ) 2 g in sodium chloride  0.9 % 100 mL IVPB  Status:  Discontinued        2 g 200 mL/hr over 30 Minutes Intravenous  Once 12/22/23 1728 12/22/23 1750   12/23/23 0000  metroNIDAZOLE (FLAGYL) IVPB 500 mg        500 mg 100 mL/hr over 60 Minutes Intravenous Every 12 hours 12/22/23 1728     12/23/23 0000  cefTRIAXone  (ROCEPHIN ) 2 g in sodium chloride  0.9 % 100 mL IVPB  Status:  Discontinued        2 g 200 mL/hr over 30 Minutes Intravenous Every 24 hours 12/22/23 1750 12/22/23 1751   12/22/23 1315  cefTRIAXone  (ROCEPHIN ) 1 g in sodium chloride  0.9 % 100 mL IVPB        1 g 200 mL/hr over 30 Minutes Intravenous  Once 12/22/23 1301 12/22/23 1344   12/22/23 1315  metroNIDAZOLE (FLAGYL) IVPB 500 mg        500 mg 100 mL/hr over 60 Minutes Intravenous  Once 12/22/23 1301 12/22/23 1444        I have personally reviewed the following labs and images: CBC: Recent Labs  Lab 12/17/23 1224 12/21/23 1358 12/22/23 1045 12/23/23 0541  WBC 2.5* 1.9* 1.5* 2.2*  NEUTROABS 2.0 0.8* 0.6*  --   HGB 10.4* 9.8* 10.6* 9.0*  HCT 30.5* 27.8* 31.0* 26.7*  MCV 84.7 84.0 85.9 87.5   PLT 171 146* 159 135*   BMP &GFR Recent Labs  Lab 12/21/23 1358 12/22/23 1045 12/23/23 0541  NA 131* 132* 132*  K 3.8 3.7 3.3*  CL 99 96* 99  CO2 27 25 25   GLUCOSE 124* 141* 123*  BUN 9 11 8   CREATININE 0.74 0.77 0.64  CALCIUM  8.5* 9.4 8.0*  MG  --   --  1.9  PHOS  --   --  2.4*   Estimated Creatinine Clearance: 83.3 mL/min (by C-G formula based on SCr of 0.64 mg/dL). Liver & Pancreas: Recent Labs  Lab 12/21/23 1358 12/22/23 1045 12/23/23 0541  AST 14* 21 21  ALT 14 20 19   ALKPHOS 74 92 81  BILITOT 0.6 0.6 0.4  PROT 5.8* 6.2* 5.2*  ALBUMIN 3.2* 3.5 2.8*   Recent Labs  Lab 12/22/23 1045  LIPASE 11   No results for input(s): AMMONIA in the last 168 hours. Diabetic: No results for input(s): HGBA1C in the last 72 hours. No results for input(s): GLUCAP in the last 168 hours. Cardiac Enzymes: No results for input(s): CKTOTAL, CKMB, CKMBINDEX, TROPONINI in the last 168 hours. No results for input(s): PROBNP in the last 8760 hours. Coagulation Profile: No results for input(s): INR, PROTIME in the last 168 hours. Thyroid Function Tests: No results for input(s): TSH, T4TOTAL, FREET4, T3FREE, THYROIDAB in the last 72 hours. Lipid Profile: No results for input(s): CHOL, HDL, LDLCALC, TRIG, CHOLHDL, LDLDIRECT in the last 72 hours. Anemia Panel: No results for input(s): VITAMINB12, FOLATE, FERRITIN, TIBC, IRON, RETICCTPCT in the last 72 hours. Urine analysis:    Component Value Date/Time   COLORURINE YELLOW 12/22/2023 1135   APPEARANCEUR CLOUDY (A) 12/22/2023 1135   LABSPEC 1.020 12/22/2023 1135   PHURINE 6.5 12/22/2023 1135   GLUCOSEU NEGATIVE 12/22/2023 1135   HGBUR MODERATE (A) 12/22/2023 1135   BILIRUBINUR NEGATIVE 12/22/2023 1135   KETONESUR NEGATIVE 12/22/2023 1135   PROTEINUR 30 (A) 12/22/2023 1135   UROBILINOGEN 0.2 09/06/2009 1350   NITRITE NEGATIVE 12/22/2023 1135   LEUKOCYTESUR LARGE (A) 12/22/2023  1135   Sepsis Labs: Invalid input(s): PROCALCITONIN, LACTICIDVEN  Microbiology: Recent Results (from the past 240 hours)  C Difficile Quick Screen w PCR reflex     Status: None   Collection Time: 12/22/23  6:14 PM   Specimen: Stool  Result Value Ref Range Status   C Diff antigen NEGATIVE NEGATIVE Final   C Diff toxin NEGATIVE NEGATIVE Final   C Diff interpretation No C. difficile detected.  Final    Comment: Performed at [facility], [address] [place], [place] [address]  Culture, blood (Routine X 2) w Reflex to ID Panel     Status: None (Preliminary result)   Collection Time: 12/22/23  7:14 PM   Specimen: BLOOD RIGHT HAND  Result Value Ref Range Status   Specimen Description BLOOD RIGHT HAND  Final   Special Requests   Final    BOTTLES DRAWN AEROBIC ONLY Blood Culture adequate volume   Culture   Final    NO GROWTH < 12 HOURS Performed at [facility], [address] [place], [place] [address]    Report Status PENDING  Incomplete  Culture, blood (Routine X 2) w Reflex to ID Panel     Status: None (Preliminary result)   Collection Time: 12/22/23  7:14 PM   Specimen: BLOOD RIGHT HAND  Result Value Ref Range Status   Specimen Description BLOOD RIGHT HAND  Final   Special Requests   Final    BOTTLES DRAWN AEROBIC ONLY Blood Culture adequate volume   Culture   Final    NO GROWTH < 12 HOURS Performed at [facility], [address] [place], [place] [address]    Report Status PENDING  Incomplete    Radiology Studies: CT ABDOMEN PELVIS W CONTRAST Result Date: 12/22/2023 EXAM: CT ABDOMEN AND PELVIS WITH CONTRAST 12/22/2023 12:10:57 PM TECHNIQUE: CT of the abdomen and pelvis was performed with the administration of 75 mL of iohexol  (OMNIPAQUE ) 300 MG/ML solution. Multiplanar reformatted images are provided for review. Automated exposure control, iterative reconstruction, and/or weight-based adjustment of the mA/kV was utilized to reduce the  radiation dose to as low as reasonably achievable. COMPARISON: Unchanged from previous exam. CLINICAL HISTORY:  Abdominal pain, acute, nonlocalized. FINDINGS: LOWER CHEST: Coronary artery calcifications. LIVER: Cyst within the right lobe of the liver measures 4.8 cm (image 20/3). Unchanged from previous exam. GALLBLADDER AND BILE DUCTS: Gallbladder is unremarkable. No biliary ductal dilatation. SPLEEN: The spleen is within normal limits in size and appearance. PANCREAS: The pancreas is normal in size and contour without focal lesion or ductal dilatation. ADRENAL GLANDS: Normal size and morphology bilaterally. No nodule, thickening, or hemorrhage. No periadrenal stranding. KIDNEYS, URETERS AND BLADDER: No stones in the kidneys or ureters. No hydronephrosis. No perinephric or periureteral stranding. Urinary bladder is unremarkable. GI AND BOWEL: Normal stomach. Dilated proximal and mid small bowel loops with scattered air-fluid levels measure up to 3.9 cm (image 54/3). Just before the terminal ileum there is a long segment of small bowel with abnormal wall thickening with surrounding congestion of the small bowel mesentery, soft tissue stranding and fluid. No pneumatosis. PERITONEUM AND RETROPERITONEUM: Small to moderate volume of free fluid noted within the pelvis. No fluid loculation identified. No signs of pneumoperitoneum. VASCULATURE: Aorta is normal in caliber. Aortic atherosclerotic calcifications. Abdominal vasculature is patent. LYMPH NODES: No abdominopelvic adenopathy. REPRODUCTIVE ORGANS: Uterus and adnexal structures are normal. Uterus appears normal. No adnexal mass. BONES AND SOFT TISSUES: No acute or suspicious osseous findings. No focal soft tissue abnormality. IMPRESSION: 1. Long segment small-bowel inflammation with wall thickening and surrounding mesenteric congestion and fluid near the terminal ileum, without pneumatosis. Imaging findings are compatible with an inflammatory or infectious enteritis.  2. Small-bowel obstruction pattern with dilated proximal and mid small-bowel loops and air-fluid levels measuring up to 3.9 cm. 3. Small to moderate volume free fluid in the pelvis without loculation. Electronically signed by: Waddell Calk MD 12/22/2023 12:31 PM EST RP Workstation: [id number]      Erin [name] T. Erin [name] [facility]  If 7PM-7AM, please contact night-coverage [email or web address] 12/23/2023, 11:29 AM

## 2023-12-24 ENCOUNTER — Telehealth: Payer: Self-pay | Admitting: *Deleted

## 2023-12-24 ENCOUNTER — Ambulatory Visit

## 2023-12-24 ENCOUNTER — Inpatient Hospital Stay

## 2023-12-24 DIAGNOSIS — D701 Agranulocytosis secondary to cancer chemotherapy: Secondary | ICD-10-CM | POA: Diagnosis not present

## 2023-12-24 DIAGNOSIS — Z720 Tobacco use: Secondary | ICD-10-CM | POA: Diagnosis not present

## 2023-12-24 DIAGNOSIS — K529 Noninfective gastroenteritis and colitis, unspecified: Secondary | ICD-10-CM | POA: Diagnosis not present

## 2023-12-24 DIAGNOSIS — C210 Malignant neoplasm of anus, unspecified: Secondary | ICD-10-CM | POA: Diagnosis not present

## 2023-12-24 LAB — CBC WITH DIFFERENTIAL/PLATELET
Basophils Absolute: 0 K/uL (ref 0.0–0.1)
Basophils Relative: 0 %
Eosinophils Absolute: 0.1 K/uL (ref 0.0–0.5)
Eosinophils Relative: 6 %
HCT: 28.7 % — ABNORMAL LOW (ref 36.0–46.0)
Hemoglobin: 9.2 g/dL — ABNORMAL LOW (ref 12.0–15.0)
Lymphocytes Relative: 4 %
Lymphs Abs: 0.1 K/uL — ABNORMAL LOW (ref 0.7–4.0)
MCH: 29.3 pg (ref 26.0–34.0)
MCHC: 32.1 g/dL (ref 30.0–36.0)
MCV: 91.4 fL (ref 80.0–100.0)
Monocytes Absolute: 0.4 K/uL (ref 0.1–1.0)
Monocytes Relative: 20 %
Neutro Abs: 1.5 K/uL — ABNORMAL LOW (ref 1.7–7.7)
Neutrophils Relative %: 70 %
Platelets: 149 K/uL — ABNORMAL LOW (ref 150–400)
RBC: 3.14 MIL/uL — ABNORMAL LOW (ref 3.87–5.11)
RDW: 19.9 % — ABNORMAL HIGH (ref 11.5–15.5)
Smear Review: NORMAL
WBC: 2.1 K/uL — ABNORMAL LOW (ref 4.0–10.5)
nRBC: 0 % (ref 0.0–0.2)

## 2023-12-24 LAB — RENAL FUNCTION PANEL
Albumin: 2.7 g/dL — ABNORMAL LOW (ref 3.5–5.0)
Anion gap: 8 (ref 5–15)
BUN: 7 mg/dL — ABNORMAL LOW (ref 8–23)
CO2: 25 mmol/L (ref 22–32)
Calcium: 8.3 mg/dL — ABNORMAL LOW (ref 8.9–10.3)
Chloride: 103 mmol/L (ref 98–111)
Creatinine, Ser: 0.58 mg/dL (ref 0.44–1.00)
GFR, Estimated: >60 mL/min (ref >60)
Glucose, Bld: 98 mg/dL (ref 70–99)
Phosphorus: 2.4 mg/dL — ABNORMAL LOW (ref 2.5–4.6)
Potassium: 3.4 mmol/L — ABNORMAL LOW (ref 3.5–5.1)
Sodium: 135 mmol/L (ref 135–145)

## 2023-12-24 LAB — MAGNESIUM: Magnesium: 2.2 mg/dL (ref 1.7–2.4)

## 2023-12-24 MED ORDER — POTASSIUM CHLORIDE CRYS ER 20 MEQ PO TBCR
60.0000 meq | EXTENDED_RELEASE_TABLET | Freq: Once | ORAL | Status: AC
  Administered 2023-12-24: 60 meq via ORAL
  Filled 2023-12-24: qty 3

## 2023-12-24 MED ORDER — LOPERAMIDE HCL 2 MG PO CAPS: 2.0000 mg | ORAL_CAPSULE | ORAL | Status: DC | PRN

## 2023-12-24 MED ORDER — DIPHENOXYLATE-ATROPINE 2.5-0.025 MG PO TABS
2.0000 | ORAL_TABLET | Freq: Four times a day (QID) | ORAL | Status: DC
  Administered 2023-12-24 – 2023-12-25 (×6): 2 via ORAL
  Filled 2023-12-24 (×6): qty 2

## 2023-12-24 MED ORDER — GUAIFENESIN-DM 100-10 MG/5ML PO SYRP
5.0000 mL | ORAL_SOLUTION | ORAL | Status: DC | PRN
  Administered 2023-12-24 – 2023-12-25 (×2): 5 mL via ORAL
  Filled 2023-12-24 (×2): qty 10

## 2023-12-24 NOTE — Progress Notes (Signed)
 Subjective No acute events. Reports she is feeling well. No n/v. Advancing to soft diet today. Continues with flatus and multiple BMs  Objective: Vital signs in last 24 hours: Temp:  [98.9 F (37.2 C)-99.6 F (37.6 C)] 98.9 F (37.2 C) (11/10 0532) Pulse Rate:  [81-92] 81 (11/10 0532) Resp:  [16-19] 18 (11/10 0532) BP: (114-139)/(55-72) 114/55 (11/10 0532) SpO2:  [95 %-97 %] 95 % (11/10 0532) Weight:  [105 kg] 105 kg (11/09 2042) Last BM Date : 12/23/23  Intake/Output from previous day: 11/09 0701 - 11/10 0700 In: 1600 [P.O.:450; I.V.:750; IV Piggyback:400] Out: -  Intake/Output this shift: No intake/output data recorded.  Gen: NAD, comfortable CV: RRR Pulm: Normal work of breathing Abd: Soft, NT/ND  Ext: SCDs in place  Lab Results: CBC  Recent Labs    12/23/23 0541 12/24/23 0522  WBC 2.2* 2.1*  HGB 9.0* 9.2*  HCT 26.7* 28.7*  PLT 135* 149*   BMET Recent Labs    12/23/23 0541 12/24/23 0522  NA 132* 135  K 3.3* 3.4*  CL 99 103  CO2 25 25  GLUCOSE 123* 98  BUN 8 7*  CREATININE 0.64 0.58  CALCIUM  8.0* 8.3*   PT/INR No results for input(s): LABPROT, INR in the last 72 hours. ABG No results for input(s): PHART, HCO3 in the last 72 hours.  Invalid input(s): PCO2, PO2  Studies/Results:  Anti-infectives: Anti-infectives (From admission, onward)    Start     Dose/Rate Route Frequency Ordered Stop   12/23/23 0600  cefTRIAXone  (ROCEPHIN ) 2 g in sodium chloride  0.9 % 100 mL IVPB        2 g 200 mL/hr over 30 Minutes Intravenous Every 24 hours 12/22/23 1751     12/23/23 0000  cefTRIAXone  (ROCEPHIN ) 2 g in sodium chloride  0.9 % 100 mL IVPB  Status:  Discontinued        2 g 200 mL/hr over 30 Minutes Intravenous  Once 12/22/23 1728 12/22/23 1750   12/23/23 0000  metroNIDAZOLE (FLAGYL) IVPB 500 mg        500 mg 100 mL/hr over 60 Minutes Intravenous Every 12 hours 12/22/23 1728     12/23/23 0000  cefTRIAXone  (ROCEPHIN ) 2 g in sodium chloride  0.9  % 100 mL IVPB  Status:  Discontinued        2 g 200 mL/hr over 30 Minutes Intravenous Every 24 hours 12/22/23 1750 12/22/23 1751   12/22/23 1315  cefTRIAXone  (ROCEPHIN ) 1 g in sodium chloride  0.9 % 100 mL IVPB        1 g 200 mL/hr over 30 Minutes Intravenous  Once 12/22/23 1301 12/22/23 1344   12/22/23 1315  metroNIDAZOLE (FLAGYL) IVPB 500 mg        500 mg 100 mL/hr over 60 Minutes Intravenous  Once 12/22/23 1301 12/22/23 1444        Assessment/Plan: Patient Active Problem List   Diagnosis Date Noted   Enteritis 12/23/2023   Chemotherapy induced neutropenia 12/23/2023   SBO (small bowel obstruction) (HCC) 12/22/2023   Neutropenic fever 11/27/2023   Anal cancer (HCC) 10/29/2023   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   Tobacco abuse 12/26/2021   Community acquired pneumonia 12/26/2021   QT prolongation 12/21/2021   Hypertension 12/21/2021   Acute lung injury 12/20/2021   Multifocal pneumonia 12/18/2021   Sepsis due to pneumonia (HCC) 12/17/2021   Depression 12/17/2021   Hypokalemia 12/17/2021   Acute respiratory failure with hypoxia (HCC) 12/17/2021   Essential hypertension 12/17/2021   Prolonged QT  interval 12/17/2021   Possible partial SBO, enteritis   - Soft diet as tolerated - Ambulate as able - Ok for antidiarrheals per primary - noted c diff was negative - We will remain available   LOS: 2 days   I spent a total of 35 minutes in both face-to-face and non-face-to-face activities, excluding procedures performed, for this visit on the date of this encounter.   Lonni Pizza, MD [facility], A [facility]

## 2023-12-24 NOTE — Progress Notes (Addendum)
 Cydni K Marker   DOB:1950/09/06   FM#:[id number]      ASSESSMENT & PLAN:  Erin [name] is a 73 year old female patient with oncologic history significant for anal cancer.  Has been on chemo and radiation therapy.  Admitted 12/22/2023 with complaints of profuse diarrhea and neutropenia.  Medical oncology/Dr. Cloretta following.   Anal cancer, stage IIa (cT2, cN0) - Diagnosed 09/27/2023. - Started on concurrent chemo/radiation therapy regimen with mitomycin + 5-FU.  Cycle 1 given 11/12/2023. - Last day radiation therapy expected today - Medical oncology/Dr. Cloretta following closely.  Acute on chronic diarrhea - Diarrhea improving -- Secondary to chemotherapy, radiation therapy and infectious enteritis. - CT imaging done 12/22/2023 findings were compatible with inflammatory or infectious enteritis.  Also noted concern for SBO pattern. - Blood cultures negative with NGTD - Continue antibiotics as ordered - Continue IV fluids and supportive care  Pancytopenia - Likely due to recent oncologic therapy plus malignancy Leukopenia/neutropenia - WBC 2.1 with ANC 1.5 - Low-grade temp 99.6 noted. - Monitor fever curve closely Anemia - Hemoglobin 9.2 - No transfusional intervention required at this time Thrombocytopenia - Mild.  Platelets 149K - Continue to monitor CBC with differential  Electrolyte imbalance Hypokalemia/hypophosphatemia/hypocalcemia - Likely secondary to diarrhea - Continue electrolyte replacement - Monitor electrolyte levels closely    Code Status Full   Subjective:  Patient seen awake and alert sitting up in bed eating breakfast.  Admits to abdominal cramping on and off.  Admits that diarrhea has improved significantly and has not had any today.  States she is tolerating food okay so far. Denies chest pain, rectal pain, or other acute pain.  Objective:   Intake/Output Summary (Last 24 hours) at 12/24/2023 0933 Last data filed at 12/24/2023  0600 Gross per 24 hour  Intake 1600 ml  Output --  Net 1600 ml     PHYSICAL EXAMINATION: ECOG PERFORMANCE STATUS: 2 - Symptomatic, <50% confined to bed  Vitals:   12/23/23 2137 12/24/23 0532  BP: 139/64 (!) 114/55  Pulse: 86 81  Resp: 19 18  Temp: 99.5 F (37.5 C) 98.9 F (37.2 C)  SpO2: 95% 95%   Filed Weights   12/23/23 2042  Weight: 231 lb 7.7 oz (105 kg)    GENERAL: alert, no distress and comfortable SKIN: +Pale skin color, texture, turgor are normal, no rashes or significant lesions EYES: normal, conjunctiva are pink and non-injected, sclera clear OROPHARYNX: no exudate, no erythema and lips, buccal mucosa, and tongue normal  NECK: supple, thyroid normal size, non-tender, without nodularity LYMPH: no palpable lymphadenopathy in the cervical, axillary or inguinal LUNGS: clear to auscultation and percussion with normal breathing effort HEART: regular rate & rhythm and no murmurs and no lower extremity edema ABDOMEN: abdomen soft, non-tender and +normal bowel sounds MUSCULOSKELETAL: no cyanosis of digits and no clubbing  PSYCH: alert & oriented x 3 with fluent speech NEURO: no focal motor/sensory deficits Skin: Stool coating the perineum with external hemorrhoids and erythema/superficial skin breakdown   All questions were answered. The patient knows to call the clinic with any problems, questions or concerns.   The total time spent in the appointment was 40 minutes encounter with patient including review of chart and various tests results, discussions about plan of care and coordination of care plan  Olam JINNY Brunner, NP 12/24/2023 9:33 AM    Labs Reviewed:  Lab Results  Component Value Date   WBC 2.1 (L) 12/24/2023   HGB 9.2 (L) 12/24/2023   HCT  28.7 (L) 12/24/2023   MCV 91.4 12/24/2023   PLT 149 (L) 12/24/2023   Recent Labs    12/21/23 1358 12/22/23 1045 12/23/23 0541 12/24/23 0522  NA 131* 132* 132* 135  K 3.8 3.7 3.3* 3.4*  CL 99 96* 99 103  CO2  27 25 25 25   GLUCOSE 124* 141* 123* 98  BUN 9 11 8  7*  CREATININE 0.74 0.77 0.64 0.58  CALCIUM  8.5* 9.4 8.0* 8.3*  GFRNONAA >60 >60 >60 >60  PROT 5.8* 6.2* 5.2*  --   ALBUMIN 3.2* 3.5 2.8* 2.7*  AST 14* 21 21  --   ALT 14 20 19   --   ALKPHOS 74 92 81  --   BILITOT 0.6 0.6 0.4  --     Studies Reviewed:  CT ABDOMEN PELVIS W CONTRAST Result Date: 12/22/2023 EXAM: CT ABDOMEN AND PELVIS WITH CONTRAST 12/22/2023 12:10:57 PM TECHNIQUE: CT of the abdomen and pelvis was performed with the administration of 75 mL of iohexol  (OMNIPAQUE ) 300 MG/ML solution. Multiplanar reformatted images are provided for review. Automated exposure control, iterative reconstruction, and/or weight-based adjustment of the mA/kV was utilized to reduce the radiation dose to as low as reasonably achievable. COMPARISON: Unchanged from previous exam. CLINICAL HISTORY: Abdominal pain, acute, nonlocalized. FINDINGS: LOWER CHEST: Coronary artery calcifications. LIVER: Cyst within the right lobe of the liver measures 4.8 cm (image 20/3). Unchanged from previous exam. GALLBLADDER AND BILE DUCTS: Gallbladder is unremarkable. No biliary ductal dilatation. SPLEEN: The spleen is within normal limits in size and appearance. PANCREAS: The pancreas is normal in size and contour without focal lesion or ductal dilatation. ADRENAL GLANDS: Normal size and morphology bilaterally. No nodule, thickening, or hemorrhage. No periadrenal stranding. KIDNEYS, URETERS AND BLADDER: No stones in the kidneys or ureters. No hydronephrosis. No perinephric or periureteral stranding. Urinary bladder is unremarkable. GI AND BOWEL: Normal stomach. Dilated proximal and mid small bowel loops with scattered air-fluid levels measure up to 3.9 cm (image 54/3). Just before the terminal ileum there is a long segment of small bowel with abnormal wall thickening with surrounding congestion of the small bowel mesentery, soft tissue stranding and fluid. No pneumatosis. PERITONEUM  AND RETROPERITONEUM: Small to moderate volume of free fluid noted within the pelvis. No fluid loculation identified. No signs of pneumoperitoneum. VASCULATURE: Aorta is normal in caliber. Aortic atherosclerotic calcifications. Abdominal vasculature is patent. LYMPH NODES: No abdominopelvic adenopathy. REPRODUCTIVE ORGANS: Uterus and adnexal structures are normal. Uterus appears normal. No adnexal mass. BONES AND SOFT TISSUES: No acute or suspicious osseous findings. No focal soft tissue abnormality. IMPRESSION: 1. Long segment small-bowel inflammation with wall thickening and surrounding mesenteric congestion and fluid near the terminal ileum, without pneumatosis. Imaging findings are compatible with an inflammatory or infectious enteritis. 2. Small-bowel obstruction pattern with dilated proximal and mid small-bowel loops and air-fluid levels measuring up to 3.9 cm. 3. Small to moderate volume free fluid in the pelvis without loculation. Electronically signed by: Waddell Calk MD 12/22/2023 12:31 PM EST RP Workstation: [id number]   IR PICC PLACEMENT LEFT >5 YRS INC IMG GUIDE Result Date: 12/07/2023 INDICATION: Patient with history of anal cancer; central venous access requested for chemotherapy EXAM: LEFT UPPER EXTREMITY PICC LINE PLACEMENT WITH ULTRASOUND AND FLUOROSCOPIC GUIDANCE MEDICATIONS: 2 mL 1% lidocaine  with epinephrine to skin and subcutaneous tissue FLUOROSCOPY: Radiation Exposure Index (as provided by the fluoroscopic device): 8.9 mGy Kerma COMPLICATIONS: None immediate. PROCEDURE: The patient was advised of the possible risks and complications and agreed to undergo the procedure. The  patient was then brought to the angiographic suite for the procedure. The left arm was prepped with chlorhexidine , draped in the usual sterile fashion using maximum barrier technique (cap and mask, sterile gown, sterile gloves, large sterile sheet, hand hygiene and cutaneous antisepsis) and infiltrated locally with 1%  Lidocaine  with epinephrine. Ultrasound demonstrated patency of the left brachial vein, and this was documented with an image. Under real-time ultrasound guidance, this vein was accessed with a 21 gauge micropuncture needle and image documentation was performed. A 0.018 wire was introduced in to the vein. Over this, a 5 French dual lumen power injectable PICC was advanced to the lower SVC/right atrial junction. Fluoroscopy during the procedure and fluoro spot radiograph confirms appropriate catheter position. The catheter was flushed and covered with a sterile dressing. Catheter length: 52 cm IMPRESSION: Successful left arm power PICC line placement with ultrasound and fluoroscopic guidance. The catheter is ready for use. Performed by: Franky Rakers, PA-C Electronically Signed   By: Marcey Moan M.D.   On: 12/07/2023 14:48   CT ABDOMEN PELVIS W CONTRAST Result Date: 11/26/2023 CLINICAL DATA:  Neutropenic fever, rectal cancer EXAM: CT ABDOMEN AND PELVIS WITH CONTRAST TECHNIQUE: Multidetector CT imaging of the abdomen and pelvis was performed using the standard protocol following bolus administration of intravenous contrast. RADIATION DOSE REDUCTION: This exam was performed according to the departmental dose-optimization program which includes automated exposure control, adjustment of the mA and/or kV according to patient size and/or use of iterative reconstruction technique. CONTRAST:  OMNIPAQUE  IOHEXOL  300 MG/ML  SOLN COMPARISON:  None Available. FINDINGS: Lower chest: No acute findings. Hepatobiliary: 5 cm central cyst in the right hepatic lobe. No suspicious focal hepatic abnormality. Gallbladder unremarkable. No biliary ductal dilatation. Pancreas: No focal abnormality or ductal dilatation. Spleen: No focal abnormality.  Normal size. Adrenals/Urinary Tract: No adrenal abnormality. No focal renal abnormality. No stones or hydronephrosis. Urinary bladder is unremarkable. Stomach/Bowel: Stomach is within  normal limits. Appendix appears normal. No evidence of bowel wall thickening, distention, or inflammatory changes. Vascular/Lymphatic: Are aortic atherosclerosis. No evidence of aneurysm or adenopathy. Reproductive: Uterus and adnexa unremarkable.  No mass. Other: No free fluid or free air. Musculoskeletal: No acute bony abnormality. IMPRESSION: No acute findings in the abdomen or pelvis. Aortic atherosclerosis. Electronically Signed   By: Franky Crease M.D.   On: 11/26/2023 22:04   DG Chest 2 View if patient is not in a treatment room. Result Date: 11/26/2023 EXAM: 2 VIEW(S) XRAY OF THE CHEST 11/26/2023 06:30:00 PM COMPARISON: Comparison day chest x-ray 12/21/2021. CLINICAL HISTORY: Suspected Sepsis. Reports fever and diarrhea starting today. Currently getting treatment for Anal/colorectal cancer. Active chemo. Week 3. FINDINGS: LUNGS AND PLEURA: No focal pulmonary opacity. No pulmonary edema. No pleural effusion. No pneumothorax. HEART AND MEDIASTINUM: No acute abnormality of the cardiac and mediastinal silhouettes. BONES AND SOFT TISSUES: No acute osseous abnormality. IMPRESSION: 1. No acute cardiopulmonary disease Electronically signed by: Greig Pique MD 11/26/2023 06:56 PM EDT RP Workstation: [id number]   Anal cancer, stage IIa (cT2, cN0) 09/27/2023 colonoscopy: Mass at the dentate line, 3.5 cm, biopsy-moderately differentiated squamous cell carcinoma, p16 positive, Ki-67 increased 08/20/2023-CT chest lung cancer screening-unchanged 4 mm subpleural nodule right middle lobe, no new nodules 10/08/2023-CT abdomen/pelvis-no evidence of metastatic disease, right hepatic cyst, shotty retroperitoneal and pelvic adenopathy-no pathologic adenopathy PET scan 11/02/2023-hypermetabolic soft tissue prominence at the anorectal junction.  No evidence of metastatic disease. Radiation 11/12/2023  Cycle 1 5-FU/Mitomycin-C 11/12/2023 Cycle 2 5-FU/Mitomycin-C 12/10/2023 G2, P2 Basal cell carcinoma of  the  nose Hyperlipidemia Anxiety/depression Alcoholism Tobacco use Admission 11/26/2023 with fever and neutropenia, blood cultures negative, discharged home 11/28/2023 on oral antibiotics 12/22/2023 admission with diarrhea, neutropenia 12/22/2023 CT abdomen/pelvis: Dilated proximal and mid small bowel loops with scattered air/fluid levels, long segment of small bowel wall thickening at the ileum   Ms. Sedivy has anal cancer.  She is completing a course of concurrent chemotherapy and radiation.  She completed cycle 2 chemotherapy 12/10/2023 and is now at day 15.  She is scheduled to complete radiation today.  She was admitted 12/22/2023 with diarrhea.  She had neutropenia on hospital admission.  The neutrophil count is improved today.  The admission abdominal CT revealed dilated loops of small bowel with thickening at the ileum.  I suspect the diarrhea and CT findings are related to chemotherapy/radiation induced enteritis.  A C. difficile toxin and stool panel were negative.  Recommendations: Continue Lomotil, add Imodium Intravenous fluids, replace electrolytes Complete final day of radiation when appropriate per radiation oncology Skin care for the perineal breakdown Follow-up as scheduled at the Cancer center

## 2023-12-24 NOTE — Plan of Care (Signed)
  Problem: Pain Managment: Goal: General experience of comfort will improve and/or be controlled Outcome: Progressing

## 2023-12-24 NOTE — Evaluation (Signed)
 Occupational Therapy Evaluation Patient Details Name: Erin [name] MRN: [id number] DOB: [birth year] Today's Date: 12/24/2023   History of Present Illness   Pt is a 73 y.o. female presenting with persistent diarrhea. CT raises concern for inflammatory or infectious enteritis. Possible partial SBO noted on CT. Past medical history significant for but not limited to anxiety/depression, HLD, alcohol abuse, and anal cancer currently on chemoradiation.     Clinical Impressions PTA, patient was living at home alone and completing all ADL and IADL activities independently.  Acute illness has had no significant impact on patient's ability to complete self-care tasks.  Patient demonstrated self-care and functional mobility skills at baseline.  Patient evaluated by Occupational Therapy with no further acute OT needs identified. All education has been completed and the patient has no further questions. See below for any follow-up Occupational Therapy or equipment needs. OT to sign off. Thank you for referral.      Functional Status Assessment   Patient has not had a recent decline in their functional status     Equipment Recommendations   BSC/3in1      Precautions/Restrictions   Precautions Precautions: Fall Restrictions Weight Bearing Restrictions Per Provider Order: No     Mobility Bed Mobility Overal bed mobility: Independent    Transfers Overall transfer level: Independent Equipment used: None      Balance Overall balance assessment: No apparent balance deficits (not formally assessed)     ADL either performed or assessed with clinical judgement   ADL Overall ADL's : Modified independent;At baseline   General ADL Comments: Patient has been taking self to bathroom.  Endorsed completing grooming & hygiene earlier.  Donned socks with independence.     Vision Baseline Vision/History: 1 Wears glasses;0 No visual deficits Ability to See in Adequate Light: 0  Adequate Patient Visual Report: No change from baseline Vision Assessment?: Wears glasses for reading            Pertinent Vitals/Pain Pain Assessment Pain Assessment: 0-10 Pain Score: 5  Pain Location: Abdomen Pain Descriptors / Indicators: Cramping Pain Intervention(s): Premedicated before session, Monitored during session, Repositioned     Extremity/Trunk Assessment Upper Extremity Assessment Upper Extremity Assessment: Overall WFL for tasks assessed   Lower Extremity Assessment Lower Extremity Assessment: Defer to PT evaluation   Cervical / Trunk Assessment Cervical / Trunk Assessment: Normal   Communication Communication Communication: No apparent difficulties   Cognition Arousal: Alert Behavior During Therapy: WFL for tasks assessed/performed Cognition: No apparent impairments   Following commands: Intact       General Comments   Patient walked length of entire hallway with no SOB observed.           Home Living Family/patient expects to be discharged to:: Private residence Living Arrangements: Alone Available Help at Discharge: Family;Friend(s) (Daughter & son) Type of Home: House Home Access: Stairs to enter Secretary/administrator of Steps: 4 Home Layout: Two level;Able to live on main level with bedroom/bathroom Alternate Level Stairs-Number of Steps: 10 Alternate Level Stairs-Rails: Right Bathroom Shower/Tub: Health Visitor: Standard Home Equipment: Hand held shower head;Grab bars - tub/shower   Additional Comments: Patient reported increased fatigue while bathing due to dehydration secondary to persistent diarrhea      Prior Functioning/Environment Prior Level of Function : Independent/Modified Independent;Driving   Mobility Comments: No mobility AD at PLOF; endorses walking in nearby park as valued occupation ADLs Comments: Independent, including cooking, housekeeping    OT Problem List: Decreased activity tolerance  OT Goals(Current goals can be found in the care plan section)   Acute Rehab OT Goals Patient Stated Goal: Get better and go home OT Goal Formulation: With patient/family Potential to Achieve Goals: Good   OT Frequency:  1 visit; evaluation only       AM-PAC OT 6 Clicks Daily Activity     Outcome Measure Help from another person eating meals?: None Help from another person taking care of personal grooming?: None Help from another person toileting, which includes using toliet, bedpan, or urinal?: None Help from another person bathing (including washing, rinsing, drying)?: None Help from another person to put on and taking off regular upper body clothing?: None Help from another person to put on and taking off regular lower body clothing?: None 6 Click Score: 24   End of Session Equipment Utilized During Treatment: Gait belt Nurse Communication: Mobility status;Other (comment) (Rec for 3 in 1)  Activity Tolerance: Patient tolerated treatment well Patient left: in chair;with call bell/phone within reach;with family/visitor present  OT Visit Diagnosis: Muscle weakness (generalized) (M62.81)                Time: 9058-8993 OT Time Calculation (min): 25 min Charges:  OT General Charges $OT Visit: 1 Visit OT Evaluation $OT Eval Low Complexity: 1 Low OT Treatments $Therapeutic Activity: 8-22 mins  Lynett Brasil B. Kaidin Boehle, MS, OTR/L 12/24/2023, 10:35 AM

## 2023-12-24 NOTE — Progress Notes (Signed)
 PROGRESS NOTE  Erin [name] FMW:[id number] DOB: Jul 29, 1950   PCP: [name], PA-C  Patient is from: Home.  Independently ambulates at baseline.  DOA: 12/22/2023 LOS: 2  Chief complaints Chief Complaint  Patient presents with   Diarrhea     Brief Narrative / Interim history:  73 y.o. female with PMH of oral cancer on chemotherapy and radiation, HTN, tobacco use disorder and depression presented to [facility] with worsening diarrhea, and admitted with acute on chronic diarrhea.  CT abdomen and pelvis concerning for enteritis and possible partial SBO.  General surgery consulted.  Started on ceftriaxone , Flagyl and IV fluids.  Next day, ongoing diarrhea.  C. difficile, GI PCR and blood cultures negative.  Seems to be improving.  Subjective: Seen and examined earlier this morning.  No major events overnight or this morning.  She says she had 4 episodes of diarrhea yesterday.  No diarrhea or bowel movement overnight.  She says she also has not eaten.  Reports some dry cough.   Assessment and plan: Acute on chronic diarrhea-likely due to chemo and infectious enteritis.  Enteritis-CT raises concern for inflammatory or infectious enteritis.  Could be due to chemo. Possible partial SBO-noted on CT.  Exam normal.  -C. difficile, GIP and blood cultures negative. -Continue ceftriaxone  and Flagyl for total of 5 days -Advance to soft diet. -Resume home Lamictal. -Continue IV fluid -Appreciate help by general surgery   Neutropenia/lymphocytopenia: Likely due to chemo.  Seems like she has cycle 2 of a chemotherapy on 10/27.  Worsening leukocytosis since his second cycle.  Improving. - Antibiotics as above - Primary oncology on board.   Stage IIa Anal cancer s/p radiation.  Currently on chemo - Primary oncology on board.   Tobacco use disorder: Reports smoking about 5 cigarettes a day - Encouraged smoking cessation - Nicotine  patch   Pancytopenia: Likely due to  chemo. Slight drop in Hgb likely dilutional. - Continue monitoring   Hyperglycemia: No history of diabetes - Check hemoglobin A1c   Hyponatremia: Mild and stable. - Continue monitoring   Depression: Stable - Continue home meds after med rec  Hypokalemia -Monitor replenish K and Mg as appropriate   Pyuria with microscopic hematuria: Patient attributed dysuria to radiation. - Antibiotics as above  Class I obesity Body mass index is 34.18 kg/m.           DVT prophylaxis:  enoxaparin  (LOVENOX ) injection 40 mg Start: 12/22/23 2200  Code Status: Full code Family Communication: Updated patient's daughter at bedside. Level of care: Med-Surg Status is: Inpatient Remains inpatient appropriate because: Infectious diarrhea/enteritis   Final disposition: Home   35 minutes with more than 50% spent in reviewing records, counseling patient/family and coordinating care.  Consultants:  Hematology/oncology General Surgery  Procedures: None  Microbiology summarized: Blood cultures NGTD C. difficile negative GIP negative.  Objective: Vitals:   12/23/23 1844 12/23/23 2042 12/23/23 2137 12/24/23 0532  BP: 134/72  139/64 (!) 114/55  Pulse: 92  86 81  Resp: 16  19 18   Temp: 99.6 F (37.6 C)  99.5 F (37.5 C) 98.9 F (37.2 C)  TempSrc: Oral  Oral Oral  SpO2: 97%  95% 95%  Weight:  105 kg    Height:  5' 9 (1.753 m)      Examination:  GENERAL: No apparent distress.  Nontoxic. HEENT: MMM.  Vision and hearing grossly intact.  NECK: Supple.  No apparent JVD.  RESP:  No IWOB.  Fair aeration bilaterally. CVS:  RRR.  Heart sounds normal.  ABD/GI/GU: BS+. Abd soft, NTND.  MSK/EXT:  Moves extremities. No apparent deformity. No edema.  SKIN: no apparent skin lesion or wound NEURO: AA.  Oriented appropriately.  No apparent focal neuro deficit. PSYCH: Calm. Normal affect.   Sch Meds:  Scheduled Meds:  atorvastatin   80 mg Oral QHS   buPROPion   150 mg Oral q morning    busPIRone  10 mg Oral Daily   And   busPIRone  5 mg Oral QHS   cloNIDine   0.1 mg Oral QHS   diphenoxylate-atropine  2 tablet Oral QID   enoxaparin  (LOVENOX ) injection  40 mg Subcutaneous Q24H   FLUoxetine   40 mg Oral Daily   hydrOXYzine   50 mg Oral QHS   lurasidone   40 mg Oral QHS   nicotine   14 mg Transdermal Daily   Continuous Infusions:  cefTRIAXone  (ROCEPHIN )  IV 2 g (12/24/23 0506)   metronidazole 500 mg (12/23/23 2338)   PRN Meds:.acetaminophen , ALPRAZolam, guaiFENesin-dextromethorphan, HYDROcodone -acetaminophen   Antimicrobials: Anti-infectives (From admission, onward)    Start     Dose/Rate Route Frequency Ordered Stop   12/23/23 0600  cefTRIAXone  (ROCEPHIN ) 2 g in sodium chloride  0.9 % 100 mL IVPB        2 g 200 mL/hr over 30 Minutes Intravenous Every 24 hours 12/22/23 1751     12/23/23 0000  cefTRIAXone  (ROCEPHIN ) 2 g in sodium chloride  0.9 % 100 mL IVPB  Status:  Discontinued        2 g 200 mL/hr over 30 Minutes Intravenous  Once 12/22/23 1728 12/22/23 1750   12/23/23 0000  metroNIDAZOLE (FLAGYL) IVPB 500 mg        500 mg 100 mL/hr over 60 Minutes Intravenous Every 12 hours 12/22/23 1728     12/23/23 0000  cefTRIAXone  (ROCEPHIN ) 2 g in sodium chloride  0.9 % 100 mL IVPB  Status:  Discontinued        2 g 200 mL/hr over 30 Minutes Intravenous Every 24 hours 12/22/23 1750 12/22/23 1751   12/22/23 1315  cefTRIAXone  (ROCEPHIN ) 1 g in sodium chloride  0.9 % 100 mL IVPB        1 g 200 mL/hr over 30 Minutes Intravenous  Once 12/22/23 1301 12/22/23 1344   12/22/23 1315  metroNIDAZOLE (FLAGYL) IVPB 500 mg        500 mg 100 mL/hr over 60 Minutes Intravenous  Once 12/22/23 1301 12/22/23 1444        I have personally reviewed the following labs and images: CBC: Recent Labs  Lab 12/17/23 1224 12/21/23 1358 12/22/23 1045 12/23/23 0541 12/24/23 0522  WBC 2.5* 1.9* 1.5* 2.2* 2.1*  NEUTROABS 2.0 0.8* 0.6*  --  1.5*  HGB 10.4* 9.8* 10.6* 9.0* 9.2*  HCT 30.5* 27.8* 31.0*  26.7* 28.7*  MCV 84.7 84.0 85.9 87.5 91.4  PLT 171 146* 159 135* 149*   BMP &GFR Recent Labs  Lab 12/21/23 1358 12/22/23 1045 12/23/23 0541 12/24/23 0522  NA 131* 132* 132* 135  K 3.8 3.7 3.3* 3.4*  CL 99 96* 99 103  CO2 27 25 25 25   GLUCOSE 124* 141* 123* 98  BUN 9 11 8  7*  CREATININE 0.74 0.77 0.64 0.58  CALCIUM  8.5* 9.4 8.0* 8.3*  MG  --   --  1.9 2.2  PHOS  --   --  2.4* 2.4*   Estimated Creatinine Clearance: 83.2 mL/min (by C-G formula based on SCr of 0.58 mg/dL). Liver & Pancreas: Recent Labs  Lab 12/21/23 1358 12/22/23  1045 12/23/23 0541 12/24/23 0522  AST 14* 21 21  --   ALT 14 20 19   --   ALKPHOS 74 92 81  --   BILITOT 0.6 0.6 0.4  --   PROT 5.8* 6.2* 5.2*  --   ALBUMIN 3.2* 3.5 2.8* 2.7*   Recent Labs  Lab 12/22/23 1045  LIPASE 11   No results for input(s): AMMONIA in the last 168 hours. Diabetic: Recent Labs    12/23/23 0541  HGBA1C 5.9*   No results for input(s): GLUCAP in the last 168 hours. Cardiac Enzymes: No results for input(s): CKTOTAL, CKMB, CKMBINDEX, TROPONINI in the last 168 hours. No results for input(s): PROBNP in the last 8760 hours. Coagulation Profile: No results for input(s): INR, PROTIME in the last 168 hours. Thyroid Function Tests: No results for input(s): TSH, T4TOTAL, FREET4, T3FREE, THYROIDAB in the last 72 hours. Lipid Profile: No results for input(s): CHOL, HDL, LDLCALC, TRIG, CHOLHDL, LDLDIRECT in the last 72 hours. Anemia Panel: No results for input(s): VITAMINB12, FOLATE, FERRITIN, TIBC, IRON, RETICCTPCT in the last 72 hours. Urine analysis:    Component Value Date/Time   COLORURINE YELLOW 12/22/2023 1135   APPEARANCEUR CLOUDY (A) 12/22/2023 1135   LABSPEC 1.020 12/22/2023 1135   PHURINE 6.5 12/22/2023 1135   GLUCOSEU NEGATIVE 12/22/2023 1135   HGBUR MODERATE (A) 12/22/2023 1135   BILIRUBINUR NEGATIVE 12/22/2023 1135   KETONESUR NEGATIVE 12/22/2023 1135    PROTEINUR 30 (A) 12/22/2023 1135   UROBILINOGEN 0.2 09/06/2009 1350   NITRITE NEGATIVE 12/22/2023 1135   LEUKOCYTESUR LARGE (A) 12/22/2023 1135   Sepsis Labs: Invalid input(s): PROCALCITONIN, LACTICIDVEN  Microbiology: Recent Results (from the past 240 hours)  C Difficile Quick Screen w PCR reflex     Status: None   Collection Time: 12/22/23 11:36 AM   Specimen: Urine, Clean Catch; Stool  Result Value Ref Range Status   C Diff antigen NEGATIVE NEGATIVE Final   C Diff toxin NEGATIVE NEGATIVE Final   C Diff interpretation No C. difficile detected.  Final    Comment: Performed at [facility], [address] [place], [place] [address]  Gastrointestinal Panel by PCR , Stool     Status: None   Collection Time: 12/22/23  6:14 PM   Specimen: Stool  Result Value Ref Range Status   Campylobacter species NOT DETECTED NOT DETECTED Final   Plesimonas shigelloides NOT DETECTED NOT DETECTED Final   Salmonella species NOT DETECTED NOT DETECTED Final   Yersinia enterocolitica NOT DETECTED NOT DETECTED Final   Vibrio species NOT DETECTED NOT DETECTED Final   Vibrio cholerae NOT DETECTED NOT DETECTED Final   Enteroaggregative E coli (EAEC) NOT DETECTED NOT DETECTED Final   Enteropathogenic E coli (EPEC) NOT DETECTED NOT DETECTED Final   Enterotoxigenic E coli (ETEC) NOT DETECTED NOT DETECTED Final   Shiga like toxin producing E coli (STEC) NOT DETECTED NOT DETECTED Final   Shigella/Enteroinvasive E coli (EIEC) NOT DETECTED NOT DETECTED Final   Cryptosporidium NOT DETECTED NOT DETECTED Final   Cyclospora cayetanensis NOT DETECTED NOT DETECTED Final   Entamoeba histolytica NOT DETECTED NOT DETECTED Final   Giardia lamblia NOT DETECTED NOT DETECTED Final   Adenovirus F40/41 NOT DETECTED NOT DETECTED Final   Astrovirus NOT DETECTED NOT DETECTED Final   Norovirus GI/GII NOT DETECTED NOT DETECTED Final   Rotavirus A NOT DETECTED NOT DETECTED Final   Sapovirus (I, II, IV, and V) NOT  DETECTED NOT DETECTED Final    Comment: Performed at [facility], [address] [place], [place] [address]  C Difficile Quick Screen w PCR reflex     Status: None   Collection Time: 12/22/23  6:14 PM   Specimen: Stool  Result Value Ref Range Status   C Diff antigen NEGATIVE NEGATIVE Final   C Diff toxin NEGATIVE NEGATIVE Final   C Diff interpretation No C. difficile detected.  Final    Comment: Performed at [facility], [address] [place], [place] [address]  Culture, blood (Routine X 2) w Reflex to ID Panel     Status: None (Preliminary result)   Collection Time: 12/22/23  7:14 PM   Specimen: BLOOD RIGHT HAND  Result Value Ref Range Status   Specimen Description BLOOD RIGHT HAND  Final   Special Requests   Final    BOTTLES DRAWN AEROBIC ONLY Blood Culture adequate volume   Culture   Final    NO GROWTH 2 DAYS Performed at [facility], [address], [place], [place] [address]    Report Status PENDING  Incomplete  Culture, blood (Routine X 2) w Reflex to ID Panel     Status: None (Preliminary result)   Collection Time: 12/22/23  7:14 PM   Specimen: BLOOD RIGHT HAND  Result Value Ref Range Status   Specimen Description BLOOD RIGHT HAND  Final   Special Requests   Final    BOTTLES DRAWN AEROBIC ONLY Blood Culture adequate volume   Culture   Final    NO GROWTH 2 DAYS Performed at [facility], [address] [place], [place] [address]    Report Status PENDING  Incomplete    Radiology Studies: No results found.     Hartwell Vandiver T. Hong Timm [facility]  If 7PM-7AM, please contact night-coverage [email or web address] 12/24/2023, 11:27 AM

## 2023-12-24 NOTE — Telephone Encounter (Signed)
 Call from daughter, Angline Schweigert asking if MD will call her regarding her mother's condition. Missed him at hospital today. Has several questions.

## 2023-12-24 NOTE — TOC Initial Note (Signed)
 Transition of Care [facility]) - Initial/Assessment Note    Patient Details  Name: Erin [name] MRN: [id number] Date of Birth: Sep 11, 1950  [facility] CM/SW Contact:    Alfonse JONELLE Rex, RN Phone Number: 12/24/2023, 3:33 PM  Clinical Narrative:   Patient admitted from home with c/o diarrhea, currently or chemotherapy and radiation for oral cancer. Has PCP and insurance on file, at baseline independent with self care and functional mobility. INPT CM will follow for dc needs.                 Expected Discharge Plan: Home/Self Care Barriers to Discharge: Continued Medical Work up   Patient Goals and CMS Choice Patient states their goals for this hospitalization and ongoing recovery are:: return home          Expected Discharge Plan and Services       Living arrangements for the past 2 months: Single Family Home                                      Prior Living Arrangements/Services Living arrangements for the past 2 months: Single Family Home Lives with:: Self Patient language and need for interpreter reviewed:: Yes        Need for Family Participation in Patient Care: Yes (Comment) Care giver support system in place?: Yes (comment)   Criminal Activity/Legal Involvement Pertinent to Current Situation/Hospitalization: No - Comment as needed  Activities of Daily Living   ADL Screening (condition at time of admission) Independently performs ADLs?: Yes (appropriate for developmental age) Is the patient deaf or have difficulty hearing?: No Does the patient have difficulty seeing, even when wearing glasses/contacts?: No Does the patient have difficulty concentrating, remembering, or making decisions?: No  Permission Sought/Granted                  Emotional Assessment       Orientation: : Oriented to Self, Oriented to Place, Oriented to  Time, Oriented to Situation Alcohol / Substance Use: Not Applicable Psych Involvement: No  (comment)  Admission diagnosis:  Enteritis [K52.9] SBO (small bowel obstruction) (HCC) [K56.609] Patient Active Problem List   Diagnosis Date Noted   Enteritis 12/23/2023   Chemotherapy induced neutropenia 12/23/2023   SBO (small bowel obstruction) (HCC) 12/22/2023   Neutropenic fever 11/27/2023   Anal cancer (HCC) 10/29/2023   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 AT [address] [facility] [address] [place] [address] Phone: [phone] Fax: [phone]     Social Drivers of Health (SDOH) Social History: SDOH Screenings   Food Insecurity: No Food Insecurity (12/22/2023)  Housing: Unknown (12/22/2023)  Transportation Needs: No Transportation Needs (12/22/2023)  Utilities: Not At Risk (12/22/2023)  Depression (PHQ2-9): Low Risk  (12/10/2023)  Financial Resource Strain: Low Risk  (07/16/2023)   Received from [facility]  Physical Activity: Sufficiently Active (07/16/2023)   Received from [facility]  Social Connections: Moderately Integrated (12/22/2023)  Stress: No Stress Concern Present (07/16/2023)   Received from [facility]  Tobacco Use: High Risk (12/23/2023)   SDOH Interventions:     Readmission Risk Interventions    12/24/2023    3:32 PM  Readmission Risk Prevention Plan  Transportation Screening Complete  PCP or Specialist Appt within 3-5 Days Complete  HRI or Home Care Consult Complete  Social Work Consult  for Recovery Care Planning/Counseling Complete  Palliative Care Screening Complete  Medication Review Oceanographer) Complete

## 2023-12-25 ENCOUNTER — Ambulatory Visit

## 2023-12-25 DIAGNOSIS — D701 Agranulocytosis secondary to cancer chemotherapy: Secondary | ICD-10-CM | POA: Diagnosis not present

## 2023-12-25 DIAGNOSIS — C210 Malignant neoplasm of anus, unspecified: Secondary | ICD-10-CM | POA: Diagnosis not present

## 2023-12-25 DIAGNOSIS — K529 Noninfective gastroenteritis and colitis, unspecified: Secondary | ICD-10-CM | POA: Diagnosis not present

## 2023-12-25 DIAGNOSIS — K56609 Unspecified intestinal obstruction, unspecified as to partial versus complete obstruction: Secondary | ICD-10-CM | POA: Diagnosis not present

## 2023-12-25 LAB — RENAL FUNCTION PANEL
Albumin: 2.8 g/dL — ABNORMAL LOW (ref 3.5–5.0)
Anion gap: 8 (ref 5–15)
BUN: 9 mg/dL (ref 8–23)
CO2: 25 mmol/L (ref 22–32)
Calcium: 8.4 mg/dL — ABNORMAL LOW (ref 8.9–10.3)
Chloride: 104 mmol/L (ref 98–111)
Creatinine, Ser: 0.68 mg/dL (ref 0.44–1.00)
GFR, Estimated: >60 mL/min (ref >60)
Glucose, Bld: 105 mg/dL — ABNORMAL HIGH (ref 70–99)
Phosphorus: 2.3 mg/dL — ABNORMAL LOW (ref 2.5–4.6)
Potassium: 3.7 mmol/L (ref 3.5–5.1)
Sodium: 137 mmol/L (ref 135–145)

## 2023-12-25 LAB — CBC WITH DIFFERENTIAL/PLATELET
Abs Immature Granulocytes: 0.03 K/uL (ref 0.00–0.07)
Basophils Absolute: 0 K/uL (ref 0.0–0.1)
Basophils Relative: 1 %
Eosinophils Absolute: 0.1 K/uL (ref 0.0–0.5)
Eosinophils Relative: 3 %
HCT: 27.6 % — ABNORMAL LOW (ref 36.0–46.0)
Hemoglobin: 8.9 g/dL — ABNORMAL LOW (ref 12.0–15.0)
Immature Granulocytes: 1 %
Lymphocytes Relative: 5 %
Lymphs Abs: 0.1 K/uL — ABNORMAL LOW (ref 0.7–4.0)
MCH: 29.2 pg (ref 26.0–34.0)
MCHC: 32.2 g/dL (ref 30.0–36.0)
MCV: 90.5 fL (ref 80.0–100.0)
Monocytes Absolute: 0.5 K/uL (ref 0.1–1.0)
Monocytes Relative: 20 %
Neutro Abs: 1.7 K/uL (ref 1.7–7.7)
Neutrophils Relative %: 70 %
Platelets: 143 K/uL — ABNORMAL LOW (ref 150–400)
RBC: 3.05 MIL/uL — ABNORMAL LOW (ref 3.87–5.11)
RDW: 20.1 % — ABNORMAL HIGH (ref 11.5–15.5)
Smear Review: NORMAL
WBC: 2.4 K/uL — ABNORMAL LOW (ref 4.0–10.5)
nRBC: 0.8 % — ABNORMAL HIGH (ref 0.0–0.2)

## 2023-12-25 LAB — MAGNESIUM: Magnesium: 2 mg/dL (ref 1.7–2.4)

## 2023-12-25 MED ORDER — AMOXICILLIN-POT CLAVULANATE 875-125 MG PO TABS: 1.0000 | ORAL_TABLET | Freq: Two times a day (BID) | ORAL | 0 refills | Status: AC

## 2023-12-25 NOTE — Progress Notes (Signed)
 Pt d/c per w/c w all belongings in stable condition

## 2023-12-25 NOTE — Progress Notes (Signed)
 Our clinic was made aware of the patient's hospitalization yesterday afternoon. Given her symptoms and imaging results from her CT scan, her final treatment was held yesterday to allow Dr. Dewey to review her case first. He has independently reviewed her imaging and based on the current appearance of her small bowel and location, Dr. Dewey recommends forgoing the last of her remaining radiation treatment. I couldn't reach the patient by phone today but spoke with her daughter who is in agreement with this plan. We will check in with the patient in a week to see how she's doing. She will continue her follow up plans with Dr. Cloretta as well as an outpatient and with Dr. Debby as well for surveillance.     Donald KYM Husband, PAC

## 2023-12-25 NOTE — Progress Notes (Addendum)
 Erin [name]   DOB:[birth year]   FM#:[id number]      ASSESSMENT & PLAN:  Erin [name] is a 73 year old female patient with oncologic history significant for anal cancer.  Has been on chemo and radiation therapy.  Admitted 12/22/2023 with complaints of profuse diarrhea and neutropenia.  Medical oncology/Dr. Cloretta following.   Anal cancer, stage IIa (cT2, cN0) - Diagnosed 09/27/2023. - Started on concurrent chemo/radiation therapy regimen with mitomycin + 5-FU.  Cycle 1 given 11/12/2023. - Radiation therapy held 11/10, was scheduled to be the last treatment. - Medical oncology/Dr. Cloretta following closely.   Acute on chronic diarrhea - Improving -- Secondary to chemotherapy, radiation therapy and infectious enteritis. - CT imaging done 12/22/2023 findings were compatible with inflammatory or infectious enteritis.  Also noted concern for SBO pattern. - Blood cultures negative with NGTD - Continue antibiotics as ordered -- Continue antidiarrheals lomotil and imodium - Continue IV fluids  -- Continue supportive care including perineal skin care   Pancytopenia - Likely due to recent oncologic therapy plus malignancy Leukopenia/neutropenia - WBC 2.4 with ANC 1.7 - Low-grade temp 99.1 noted. - Monitor fever curve closely Anemia - Hemoglobin low 8.9 - No transfusional intervention required at this time Thrombocytopenia - Mild.  Platelets 143K - Continue to monitor CBC with differential   Electrolyte imbalance Hypokalemia/hypophosphatemia/hypocalcemia -- Improving -- Secondary to diarrhea - Continue electrolyte replacement - Monitor electrolyte levels closely    Code Status Full   Subjective:  Patient seen awake and alert reports that diarrhea has significantly improved, reports 1 episode earlier today.  States that she is tolerating soft diet today.  Thinks that she may be discharged today and is looking forward to going home.  No other acute complaints  offered.  Objective:   Intake/Output Summary (Last 24 hours) at 12/25/2023 0926 Last data filed at 12/25/2023 0600 Gross per 24 hour  Intake 1259.94 ml  Output --  Net 1259.94 ml     PHYSICAL EXAMINATION: ECOG PERFORMANCE STATUS: 2 - Symptomatic, <50% confined to bed  Vitals:   12/24/23 2150 12/25/23 0627  BP: (!) 142/68 (!) 129/55  Pulse: 91 88  Resp: 15 17  Temp: 98.6 F (37 C) 99.1 F (37.3 C)  SpO2: 96% 96%   Filed Weights   12/23/23 2042  Weight: 231 lb 7.7 oz (105 kg)    GENERAL: alert, no distress and comfortable SKIN: skin color, texture, turgor are normal, no rashes or significant lesions EYES: normal, conjunctiva are pink and non-injected, sclera clear OROPHARYNX: no exudate, no erythema and lips, buccal mucosa, and tongue normal  NECK: supple, thyroid normal size, non-tender, without nodularity LYMPH: no palpable lymphadenopathy in the cervical, axillary or inguinal LUNGS: clear to auscultation and percussion with normal breathing effort HEART: regular rate & rhythm and no murmurs and no lower extremity edema ABDOMEN: abdomen soft, non-tender and normal bowel sounds MUSCULOSKELETAL: no cyanosis of digits and no clubbing  PSYCH: alert & oriented x 3 with fluent speech NEURO: no focal motor/sensory deficits   All questions were answered. The patient knows to call the clinic with any problems, questions or concerns.   The total time spent in the appointment was 40 minutes encounter with patient including review of chart and various tests results, discussions about plan of care and coordination of care plan  Olam JINNY Brunner, NP 12/25/2023 9:26 AM    Labs Reviewed:  Lab Results  Component Value Date   WBC 2.4 (L) 12/25/2023   HGB 8.9 (  L) 12/25/2023   HCT 27.6 (L) 12/25/2023   MCV 90.5 12/25/2023   PLT 143 (L) 12/25/2023   Recent Labs    12/21/23 1358 12/22/23 1045 12/23/23 0541 12/24/23 0522 12/25/23 0536  NA 131* 132* 132* 135 137  K 3.8 3.7  3.3* 3.4* 3.7  CL 99 96* 99 103 104  CO2 27 25 25 25 25   GLUCOSE 124* 141* 123* 98 105*  BUN 9 11 8  7* 9  CREATININE 0.74 0.77 0.64 0.58 0.68  CALCIUM  8.5* 9.4 8.0* 8.3* 8.4*  GFRNONAA >60 >60 >60 >60 >60  PROT 5.8* 6.2* 5.2*  --   --   ALBUMIN 3.2* 3.5 2.8* 2.7* 2.8*  AST 14* 21 21  --   --   ALT 14 20 19   --   --   ALKPHOS 74 92 81  --   --   BILITOT 0.6 0.6 0.4  --   --     Studies Reviewed:  CT ABDOMEN PELVIS W CONTRAST Result Date: 12/22/2023 EXAM: CT ABDOMEN AND PELVIS WITH CONTRAST 12/22/2023 12:10:57 PM TECHNIQUE: CT of the abdomen and pelvis was performed with the administration of 75 mL of iohexol  (OMNIPAQUE ) 300 MG/ML solution. Multiplanar reformatted images are provided for review. Automated exposure control, iterative reconstruction, and/or weight-based adjustment of the mA/kV was utilized to reduce the radiation dose to as low as reasonably achievable. COMPARISON: Unchanged from previous exam. CLINICAL HISTORY: Abdominal pain, acute, nonlocalized. FINDINGS: LOWER CHEST: Coronary artery calcifications. LIVER: Cyst within the right lobe of the liver measures 4.8 cm (image 20/3). Unchanged from previous exam. GALLBLADDER AND BILE DUCTS: Gallbladder is unremarkable. No biliary ductal dilatation. SPLEEN: The spleen is within normal limits in size and appearance. PANCREAS: The pancreas is normal in size and contour without focal lesion or ductal dilatation. ADRENAL GLANDS: Normal size and morphology bilaterally. No nodule, thickening, or hemorrhage. No periadrenal stranding. KIDNEYS, URETERS AND BLADDER: No stones in the kidneys or ureters. No hydronephrosis. No perinephric or periureteral stranding. Urinary bladder is unremarkable. GI AND BOWEL: Normal stomach. Dilated proximal and mid small bowel loops with scattered air-fluid levels measure up to 3.9 cm (image 54/3). Just before the terminal ileum there is a long segment of small bowel with abnormal wall thickening with surrounding  congestion of the small bowel mesentery, soft tissue stranding and fluid. No pneumatosis. PERITONEUM AND RETROPERITONEUM: Small to moderate volume of free fluid noted within the pelvis. No fluid loculation identified. No signs of pneumoperitoneum. VASCULATURE: Aorta is normal in caliber. Aortic atherosclerotic calcifications. Abdominal vasculature is patent. LYMPH NODES: No abdominopelvic adenopathy. REPRODUCTIVE ORGANS: Uterus and adnexal structures are normal. Uterus appears normal. No adnexal mass. BONES AND SOFT TISSUES: No acute or suspicious osseous findings. No focal soft tissue abnormality. IMPRESSION: 1. Long segment small-bowel inflammation with wall thickening and surrounding mesenteric congestion and fluid near the terminal ileum, without pneumatosis. Imaging findings are compatible with an inflammatory or infectious enteritis. 2. Small-bowel obstruction pattern with dilated proximal and mid small-bowel loops and air-fluid levels measuring up to 3.9 cm. 3. Small to moderate volume free fluid in the pelvis without loculation. Electronically signed by: Waddell Calk MD 12/22/2023 12:31 PM EST RP Workstation: [id number]   IR PICC PLACEMENT LEFT >5 YRS INC IMG GUIDE Result Date: 12/07/2023 INDICATION: Patient with history of anal cancer; central venous access requested for chemotherapy EXAM: LEFT UPPER EXTREMITY PICC LINE PLACEMENT WITH ULTRASOUND AND FLUOROSCOPIC GUIDANCE MEDICATIONS: 2 mL 1% lidocaine  with epinephrine to skin and subcutaneous tissue FLUOROSCOPY: Radiation  Exposure Index (as provided by the fluoroscopic device): 8.9 mGy Kerma COMPLICATIONS: None immediate. PROCEDURE: The patient was advised of the possible risks and complications and agreed to undergo the procedure. The patient was then brought to the angiographic suite for the procedure. The left arm was prepped with chlorhexidine , draped in the usual sterile fashion using maximum barrier technique (cap and mask, sterile gown, sterile  gloves, large sterile sheet, hand hygiene and cutaneous antisepsis) and infiltrated locally with 1% Lidocaine  with epinephrine. Ultrasound demonstrated patency of the left brachial vein, and this was documented with an image. Under real-time ultrasound guidance, this vein was accessed with a 21 gauge micropuncture needle and image documentation was performed. A 0.018 wire was introduced in to the vein. Over this, a 5 French dual lumen power injectable PICC was advanced to the lower SVC/right atrial junction. Fluoroscopy during the procedure and fluoro spot radiograph confirms appropriate catheter position. The catheter was flushed and covered with a sterile dressing. Catheter length: 52 cm IMPRESSION: Successful left arm power PICC line placement with ultrasound and fluoroscopic guidance. The catheter is ready for use. Performed by: Franky Rakers, PA-C Electronically Signed   By: Marcey Moan M.D.   On: 12/07/2023 14:48   CT ABDOMEN PELVIS W CONTRAST Result Date: 11/26/2023 CLINICAL DATA:  Neutropenic fever, rectal cancer EXAM: CT ABDOMEN AND PELVIS WITH CONTRAST TECHNIQUE: Multidetector CT imaging of the abdomen and pelvis was performed using the standard protocol following bolus administration of intravenous contrast. RADIATION DOSE REDUCTION: This exam was performed according to the departmental dose-optimization program which includes automated exposure control, adjustment of the mA and/or kV according to patient size and/or use of iterative reconstruction technique. CONTRAST:  OMNIPAQUE  IOHEXOL  300 MG/ML  SOLN COMPARISON:  None Available. FINDINGS: Lower chest: No acute findings. Hepatobiliary: 5 cm central cyst in the right hepatic lobe. No suspicious focal hepatic abnormality. Gallbladder unremarkable. No biliary ductal dilatation. Pancreas: No focal abnormality or ductal dilatation. Spleen: No focal abnormality.  Normal size. Adrenals/Urinary Tract: No adrenal abnormality. No focal renal  abnormality. No stones or hydronephrosis. Urinary bladder is unremarkable. Stomach/Bowel: Stomach is within normal limits. Appendix appears normal. No evidence of bowel wall thickening, distention, or inflammatory changes. Vascular/Lymphatic: Are aortic atherosclerosis. No evidence of aneurysm or adenopathy. Reproductive: Uterus and adnexa unremarkable.  No mass. Other: No free fluid or free air. Musculoskeletal: No acute bony abnormality. IMPRESSION: No acute findings in the abdomen or pelvis. Aortic atherosclerosis. Electronically Signed   By: Franky Crease M.D.   On: 11/26/2023 22:04   DG Chest 2 View if patient is not in a treatment room. Result Date: 11/26/2023 EXAM: 2 VIEW(S) XRAY OF THE CHEST 11/26/2023 06:30:00 PM COMPARISON: Comparison day chest x-ray 12/21/2021. CLINICAL HISTORY: Suspected Sepsis. Reports fever and diarrhea starting today. Currently getting treatment for Anal/colorectal cancer. Active chemo. Week 3. FINDINGS: LUNGS AND PLEURA: No focal pulmonary opacity. No pulmonary edema. No pleural effusion. No pneumothorax. HEART AND MEDIASTINUM: No acute abnormality of the cardiac and mediastinal silhouettes. BONES AND SOFT TISSUES: No acute osseous abnormality. IMPRESSION: 1. 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  No evidence of metastatic disease. Radiation 11/12/2023  Cycle 1  5-FU/Mitomycin-C 11/12/2023 Cycle 2 5-FU/Mitomycin-C 12/10/2023 G2, P2 Basal cell carcinoma of the nose Hyperlipidemia Anxiety/depression Alcoholism Tobacco use Admission 11/26/2023 with fever and neutropenia, blood cultures negative, discharged home 11/28/2023 on oral antibiotics 12/22/2023 admission with diarrhea, neutropenia 12/22/2023 CT abdomen/pelvis: Dilated proximal and mid small bowel loops with scattered air/fluid levels, long segment of small bowel wall thickening at the ileum [name] was interviewed and examined.  She reports feeling better.  She had only 1 episode of diarrhea over the past 24 hours.  No new complaint.  The neutrophil count has recovered.  Radiation oncology has decided to cancel the final fraction of radiation.  She appears stable for discharge from an oncology standpoint.  Recommendations: She appears stable for discharge from an oncology standpoint Follow-up as scheduled at the Cancer center Please call oncology as needed

## 2023-12-25 NOTE — Progress Notes (Signed)
 AVS reviewed w/ patient who verbalized an understanding. No other questions at this time. Patient's daughter is at work but  will be here by 1530 with clothes for discharge to home.

## 2023-12-25 NOTE — Discharge Instructions (Signed)
Tips for Managing Diarrhea   Drink a lot of mild, clear liquids during the day. Room-temperature liquids may be better tolerated. To replace fluids lost with diarrhea, drink at least 1 cup of liquid after each loose bowel movement. Eat several small meals throughout the day. Lie down for 30 minutes after a meal to help slow digestion. Eat and drink foods that provide potassium and sodium, such as broths, soups, fruit juices, sport        drinks, crackers, pretzels, and ripe bananas. Eat foods with pectin (like applesauce or ripe bananas) to help firm stools. Prolonged diarrhea lasting more than 2 days may cause a temporary lactose intolerance. If this happens,        limit milk and dairy foods to no more than 2 cups per day. You can start eating dairy products again after        the diarrhea has subsided. Avoid foods, chewing gum, and candies made with sorbitol, xylitol, or mannitol. These types of sugars may cause diarrhea, gas, and bloating. Avoid greasy, fried, spicy, and sweet foods. Limit high-fiber foods like raw fruits and vegetables, whole grains, beans, nuts, and seeds as they are harder to digest. Once you are feeling better, slowly add foods with fiber back into your diet

## 2023-12-25 NOTE — Discharge Summary (Signed)
 Physician Discharge Summary  Erin [name] FMW:[id number] DOB: [birth year] DOA: 12/22/2023  PCP: [name], PA-C  Admit date: 12/22/2023 Discharge date: 12/25/23  Admitted From: Home. Disposition: Home Recommendations for Outpatient Follow-up:  Outpatient follow-up with PCP in 1 to 2 weeks Outpatient follow-up with oncology Check CMP and CBC at follow-up Please follow up on the following pending results: None  Home Health: No need identified Equipment/Devices: No need identified  Discharge Condition: Stable CODE STATUS: Full code Diet Orders (From admission, onward)     Start     Ordered   12/24/23 0918  DIET SOFT Room service appropriate? Yes; Fluid consistency: Thin  Diet effective now       Question Answer Comment  Room service appropriate? Yes   Fluid consistency: Thin      12/24/23 9082             Follow-up Information     [name], PA-C. Schedule an appointment as soon as possible for a visit in 1 week(s).   Specialty: Physician Assistant Contact information: [address] [name] [id number] [phone]                 Hospital course 73 y.o. female with PMH of oral cancer on chemotherapy and radiation, HTN, tobacco use disorder and depression presented to [facility] with worsening diarrhea, and admitted with acute on chronic diarrhea.  CT abdomen and pelvis concerning for enteritis and possible partial SBO.  General surgery consulted.  Started on ceftriaxone , Flagyl and IV fluids.   Next day, ongoing diarrhea.  C. difficile, GI PCR and blood cultures negative.  Patient's symptoms improved significantly.  Tolerated soft diet.  Discharged on p.o. Augmentin for 2 more days to complete a total of 6 days course.  She will continue home Lomotil.  Outpatient follow-up with PCP and oncology as above.  See individual problem list below for more.   Problems addressed during this hospitalization Acute  on chronic diarrhea-likely due to chemo and infectious enteritis.  Enteritis-CT raises concern for inflammatory or infectious enteritis.  Could be due to chemo. Possible partial SBO-noted on CT.  Exam normal.  -C. difficile, GIP and blood cultures negative. -Received ceftriaxone  and Flagyl for 4 days.  Discharged on p.o. Augmentin for 2 more days -Continue soft diet -Continue home Lomotil   Pancytopenia: Likely due to chemo.  Has cycle 2 of a chemo on 10/27.  Relatively stable. Recent Labs    11/28/23 0707 11/29/23 1216 11/30/23 0919 12/10/23 1122 12/17/23 1224 12/21/23 1358 12/22/23 1045 12/23/23 0541 12/24/23 0522 12/25/23 0536  HGB 9.9* 10.8* 10.8* 9.8* 10.4* 9.8* 10.6* 9.0* 9.2* 8.9*  - Recheck CBC at follow-up   Stage IIa Anal cancer s/p radiation.  Currently on chemo - Primary oncology on board.   Tobacco use disorder: Reports smoking about 5 cigarettes a day - Encouraged smoking cessation - Nicotine  patch   Hyperglycemia: No history of diabetes.  A1c 5.9%   Hyponatremia: Resolved   Depression: Stable - Continue home meds after med rec   Hypokalemia: Resolved.   Pyuria with microscopic hematuria: Patient attributed dysuria to radiation. - Antibiotics as above   Class I obesity Body mass index is 34.18 kg/m.           Consultations: Oncology/hematology General surgery  Time spent 35  minutes  Vital signs Vitals:   12/24/23 0532 12/24/23 1335 12/24/23 2150 12/25/23 0627  BP: (!) 114/55 139/62 (!) 142/68 (!) 129/55  Pulse: 81 84 91 88  Temp: 98.9 F (37.2 C) 97.9 F (36.6 C) 98.6 F (37 C) 99.1 F (37.3 C)  Resp: 18  15 17   Height:      Weight:      SpO2: 95% 98% 96% 96%  TempSrc: Oral Oral Oral Oral  BMI (Calculated):         Discharge exam  GENERAL: No apparent distress.  Nontoxic. HEENT: MMM.  Vision and hearing grossly intact.  NECK: Supple.  No apparent JVD.  RESP:  No IWOB.  Fair aeration bilaterally. CVS:  RRR. Heart sounds  normal.  ABD/GI/GU: BS+. Abd soft, NTND.  MSK/EXT:  Moves extremities. No apparent deformity. No edema.  SKIN: no apparent skin lesion or wound NEURO: Awake and alert. Oriented appropriately.  No apparent focal neuro deficit. PSYCH: Calm. Normal affect.   Discharge Instructions Discharge Instructions     Discharge instructions   Complete by: As directed    It has been a pleasure taking care of you!  You were hospitalized due to worsening diarrhea.  You have been treated with antibiotics for possible infection.  We are discharging you on more antibiotics to complete treatment course.  Follow-up with your primary care doctor and oncologist in 1 to 2 weeks or sooner if needed.   Take care,   Increase activity slowly   Complete by: As directed       Allergies as of 12/25/2023       Reactions   Lamictal [lamotrigine] Swelling, Other (See Comments)   Caused swelling of the face        Medication List     STOP taking these medications    alum & mag hydroxide-simeth-diphenhydrAMINE-nystatin       TAKE these medications    alendronate 70 MG tablet Commonly known as: FOSAMAX Take 70 mg by mouth every Tuesday.   ALPRAZolam 0.5 MG tablet Commonly known as: XANAX Take 0.5 mg by mouth daily as needed for anxiety.   amoxicillin -clavulanate 875-125 MG tablet Commonly known as: AUGMENTIN Take 1 tablet by mouth 2 (two) times daily for 2 days. Notes to patient: Take with food   atorvastatin  80 MG tablet Commonly known as: LIPITOR Take 80 mg by mouth at bedtime.   buPROPion  150 MG 24 hr tablet Commonly known as: WELLBUTRIN  XL Take 150 mg by mouth every morning.   busPIRone 5 MG tablet Commonly known as: BUSPAR Take 5-10 mg by mouth See admin instructions. Take 10 mg by mouth in the morning and 5 mg at bedtime   cloNIDine  0.1 MG tablet Commonly known as: CATAPRES  Take 0.2 mg by mouth at bedtime.   diphenoxylate-atropine 2.5-0.025 MG tablet Commonly known as:  LOMOTIL Take 2 tablets by mouth 4 (four) times daily as needed for diarrhea or loose stools. What changed: Another medication with the same name was removed. Continue taking this medication, and follow the directions you see here.   ezetimibe 10 MG tablet Commonly known as: ZETIA Take 10 mg by mouth daily.   FLUoxetine  40 MG capsule Commonly known as: PROZAC  Take 40 mg by mouth daily.   HYDROcodone -acetaminophen  5-325 MG tablet Commonly known as: NORCO/VICODIN Take 1 tablet by mouth every 6 (six) hours as needed for moderate pain (pain score 4-6). What changed: when to take this   hydrocortisone 25 MG suppository Commonly known as: ANUSOL-HC Place 1 suppository (25 mg total) rectally 2 (two) times daily. What changed:  when to take this reasons to take this  hydrOXYzine  25 MG tablet Commonly known as: ATARAX  Take 50 mg by mouth at bedtime.   lurasidone  40 MG Tabs tablet Commonly known as: LATUDA  Take 40 mg by mouth at bedtime.   nicotine  14 mg/24hr patch Commonly known as: NICODERM CQ  - dosed in mg/24 hours Place 14 mg onto the skin daily. What changed: Another medication with the same name was removed. Continue taking this medication, and follow the directions you see here.   pantoprazole  40 MG tablet Commonly known as: PROTONIX  Take 1 tablet (40 mg total) by mouth daily at 6 (six) AM.   prochlorperazine  10 MG tablet Commonly known as: COMPAZINE  Take 1 tablet (10 mg total) by mouth every 6 (six) hours as needed for nausea or vomiting.         Procedures/Studies:    CT ABDOMEN PELVIS W CONTRAST Result Date: 12/22/2023 EXAM: CT ABDOMEN AND PELVIS WITH CONTRAST 12/22/2023 12:10:57 PM TECHNIQUE: CT of the abdomen and pelvis was performed with the administration of 75 mL of iohexol  (OMNIPAQUE ) 300 MG/ML solution. Multiplanar reformatted images are provided for review. Automated exposure control, iterative reconstruction, and/or weight-based adjustment of the mA/kV  was utilized to reduce the radiation dose to as low as reasonably achievable. COMPARISON: Unchanged from previous exam. CLINICAL HISTORY: Abdominal pain, acute, nonlocalized. FINDINGS: LOWER CHEST: Coronary artery calcifications. LIVER: Cyst within the right lobe of the liver measures 4.8 cm (image 20/3). Unchanged from previous exam. GALLBLADDER AND BILE DUCTS: Gallbladder is unremarkable. No biliary ductal dilatation. SPLEEN: The spleen is within normal limits in size and appearance. PANCREAS: The pancreas is normal in size and contour without focal lesion or ductal dilatation. ADRENAL GLANDS: Normal size and morphology bilaterally. No nodule, thickening, or hemorrhage. No periadrenal stranding. KIDNEYS, URETERS AND BLADDER: No stones in the kidneys or ureters. No hydronephrosis. No perinephric or periureteral stranding. Urinary bladder is unremarkable. GI AND BOWEL: Normal stomach. Dilated proximal and mid small bowel loops with scattered air-fluid levels measure up to 3.9 cm (image 54/3). Just before the terminal ileum there is a long segment of small bowel with abnormal wall thickening with surrounding congestion of the small bowel mesentery, soft tissue stranding and fluid. No pneumatosis. PERITONEUM AND RETROPERITONEUM: Small to moderate volume of free fluid noted within the pelvis. No fluid loculation identified. No signs of pneumoperitoneum. VASCULATURE: Aorta is normal in caliber. Aortic atherosclerotic calcifications. Abdominal vasculature is patent. LYMPH NODES: No abdominopelvic adenopathy. REPRODUCTIVE ORGANS: Uterus and adnexal structures are normal. Uterus appears normal. No adnexal mass. BONES AND SOFT TISSUES: No acute or suspicious osseous findings. No focal soft tissue abnormality. IMPRESSION: 1. Long segment small-bowel inflammation with wall thickening and surrounding mesenteric congestion and fluid near the terminal ileum, without pneumatosis. Imaging findings are compatible with an  inflammatory or infectious enteritis. 2. Small-bowel obstruction pattern with dilated proximal and mid small-bowel loops and air-fluid levels measuring up to 3.9 cm. 3. Small to moderate volume free fluid in the pelvis without loculation. Electronically signed by: Waddell Calk MD 12/22/2023 12:31 PM EST RP Workstation: [id number]   IR PICC PLACEMENT LEFT >5 YRS INC IMG GUIDE Result Date: 12/07/2023 INDICATION: Patient with history of anal cancer; central venous access requested for chemotherapy EXAM: LEFT UPPER EXTREMITY PICC LINE PLACEMENT WITH ULTRASOUND AND FLUOROSCOPIC GUIDANCE MEDICATIONS: 2 mL 1% lidocaine  with epinephrine to skin and subcutaneous tissue FLUOROSCOPY: Radiation Exposure Index (as provided by the fluoroscopic device): 8.9 mGy Kerma COMPLICATIONS: None immediate. PROCEDURE: The patient was advised of the possible risks and complications and agreed  to undergo the procedure. The patient was then brought to the angiographic suite for the procedure. The left arm was prepped with chlorhexidine , draped in the usual sterile fashion using maximum barrier technique (cap and mask, sterile gown, sterile gloves, large sterile sheet, hand hygiene and cutaneous antisepsis) and infiltrated locally with 1% Lidocaine  with epinephrine. Ultrasound demonstrated patency of the left brachial vein, and this was documented with an image. Under real-time ultrasound guidance, this vein was accessed with a 21 gauge micropuncture needle and image documentation was performed. A 0.018 wire was introduced in to the vein. Over this, a 5 French dual lumen power injectable PICC was advanced to the lower SVC/right atrial junction. Fluoroscopy during the procedure and fluoro spot radiograph confirms appropriate catheter position. The catheter was flushed and covered with a sterile dressing. Catheter length: 52 cm IMPRESSION: Successful left arm power PICC line placement with ultrasound and fluoroscopic guidance. The catheter  is ready for use. Performed by: Franky Rakers, PA-C Electronically Signed   By: Marcey Moan M.D.   On: 12/07/2023 14:48   CT ABDOMEN PELVIS W CONTRAST Result Date: 11/26/2023 CLINICAL DATA:  Neutropenic fever, rectal cancer EXAM: CT ABDOMEN AND PELVIS WITH CONTRAST TECHNIQUE: Multidetector CT imaging of the abdomen and pelvis was performed using the standard protocol following bolus administration of intravenous contrast. RADIATION DOSE REDUCTION: This exam was performed according to the departmental dose-optimization program which includes automated exposure control, adjustment of the mA and/or kV according to patient size and/or use of iterative reconstruction technique. CONTRAST:  OMNIPAQUE  IOHEXOL  300 MG/ML  SOLN COMPARISON:  None Available. FINDINGS: Lower chest: No acute findings. Hepatobiliary: 5 cm central cyst in the right hepatic lobe. No suspicious focal hepatic abnormality. Gallbladder unremarkable. No biliary ductal dilatation. Pancreas: No focal abnormality or ductal dilatation. Spleen: No focal abnormality.  Normal size. Adrenals/Urinary Tract: No adrenal abnormality. No focal renal abnormality. No stones or hydronephrosis. Urinary bladder is unremarkable. Stomach/Bowel: Stomach is within normal limits. Appendix appears normal. No evidence of bowel wall thickening, distention, or inflammatory changes. Vascular/Lymphatic: Are aortic atherosclerosis. No evidence of aneurysm or adenopathy. Reproductive: Uterus and adnexa unremarkable.  No mass. Other: No free fluid or free air. Musculoskeletal: No acute bony abnormality. IMPRESSION: No acute findings in the abdomen or pelvis. Aortic atherosclerosis. Electronically Signed   By: Franky Crease M.D.   On: 11/26/2023 22:04   DG Chest 2 View if patient is not in a treatment room. Result Date: 11/26/2023 EXAM: 2 VIEW(S) XRAY OF THE CHEST 11/26/2023 06:30:00 PM COMPARISON: Comparison day chest x-ray 12/21/2021. CLINICAL HISTORY: Suspected Sepsis.  Reports fever and diarrhea starting today. Currently getting treatment for Anal/colorectal cancer. Active chemo. Week 3. FINDINGS: LUNGS AND PLEURA: No focal pulmonary opacity. No pulmonary edema. No pleural effusion. No pneumothorax. HEART AND MEDIASTINUM: No acute abnormality of the cardiac and mediastinal silhouettes. BONES AND SOFT TISSUES: No acute osseous abnormality. IMPRESSION: 1. No acute cardiopulmonary disease Electronically signed by: Greig Pique MD 11/26/2023 06:56 PM EDT RP Workstation: [id number]       The results of significant diagnostics from this hospitalization (including imaging, microbiology, ancillary and laboratory) are listed below for reference.     Microbiology: Recent Results (from the past 240 hours)  C Difficile Quick Screen w PCR reflex     Status: None   Collection Time: 12/22/23 11:36 AM   Specimen: Urine, Clean Catch; Stool  Result Value Ref Range Status   C Diff antigen NEGATIVE NEGATIVE Final   C Diff toxin NEGATIVE  NEGATIVE Final   C Diff interpretation No C. difficile detected.  Final    Comment: Performed at [facility], [address], [place], [place] [address]  Gastrointestinal Panel by PCR , Stool     Status: None   Collection Time: 12/22/23  6:14 PM   Specimen: Stool  Result Value Ref Range Status   Campylobacter species NOT DETECTED NOT DETECTED Final   Plesimonas shigelloides NOT DETECTED NOT DETECTED Final   Salmonella species NOT DETECTED NOT DETECTED Final   Yersinia enterocolitica NOT DETECTED NOT DETECTED Final   Vibrio species NOT DETECTED NOT DETECTED Final   Vibrio cholerae NOT DETECTED NOT DETECTED Final   Enteroaggregative E coli (EAEC) NOT DETECTED NOT DETECTED Final   Enteropathogenic E coli (EPEC) NOT DETECTED NOT DETECTED Final   Enterotoxigenic E coli (ETEC) NOT DETECTED NOT DETECTED Final   Shiga like toxin producing E coli (STEC) NOT DETECTED NOT DETECTED Final   Shigella/Enteroinvasive E coli (EIEC) NOT DETECTED  NOT DETECTED Final   Cryptosporidium NOT DETECTED NOT DETECTED Final   Cyclospora cayetanensis NOT DETECTED NOT DETECTED Final   Entamoeba histolytica NOT DETECTED NOT DETECTED Final   Giardia lamblia NOT DETECTED NOT DETECTED Final   Adenovirus F40/41 NOT DETECTED NOT DETECTED Final   Astrovirus NOT DETECTED NOT DETECTED Final   Norovirus GI/GII NOT DETECTED NOT DETECTED Final   Rotavirus A NOT DETECTED NOT DETECTED Final   Sapovirus (I, II, IV, and V) NOT DETECTED NOT DETECTED Final    Comment: Performed at [facility], [address] [place], [place] [address]  C Difficile Quick Screen w PCR reflex     Status: None   Collection Time: 12/22/23  6:14 PM   Specimen: Stool  Result Value Ref Range Status   C Diff antigen NEGATIVE NEGATIVE Final   C Diff toxin NEGATIVE NEGATIVE Final   C Diff interpretation No C. difficile detected.  Final    Comment: Performed at [facility], [address] [place], [place] [address]  Culture, blood (Routine X 2) w Reflex to ID Panel     Status: None (Preliminary result)   Collection Time: 12/22/23  7:14 PM   Specimen: BLOOD RIGHT HAND  Result Value Ref Range Status   Specimen Description BLOOD RIGHT HAND  Final   Special Requests   Final    BOTTLES DRAWN AEROBIC ONLY Blood Culture adequate volume   Culture   Final    NO GROWTH 3 DAYS Performed at [facility], [address], [place], [place] [address]    Report Status PENDING  Incomplete  Culture, blood (Routine X 2) w Reflex to ID Panel     Status: None (Preliminary result)   Collection Time: 12/22/23  7:14 PM   Specimen: BLOOD RIGHT HAND  Result Value Ref Range Status   Specimen Description BLOOD RIGHT HAND  Final   Special Requests   Final    BOTTLES DRAWN AEROBIC ONLY Blood Culture adequate volume   Culture   Final    NO GROWTH 3 DAYS Performed at [facility], [address], [place], [place] [address]    Report Status PENDING  Incomplete      Labs:  CBC: Recent Labs  Lab 12/21/23 1358 12/22/23 1045 12/23/23 0541 12/24/23 0522 12/25/23 0536  WBC 1.9* 1.5* 2.2* 2.1* 2.4*  NEUTROABS 0.8* 0.6*  --  1.5* 1.7  HGB 9.8* 10.6* 9.0* 9.2* 8.9*  HCT 27.8* 31.0* 26.7* 28.7*  27.6*  MCV 84.0 85.9 87.5 91.4 90.5  PLT 146* 159 135* 149* 143*   BMP &GFR Recent Labs  Lab 12/21/23 1358 12/22/23 1045 12/23/23 0541 12/24/23 0522 12/25/23 0536  NA 131* 132* 132* 135 137  K 3.8 3.7 3.3* 3.4* 3.7  CL 99 96* 99 103 104  CO2 27 25 25 25 25   GLUCOSE 124* 141* 123* 98 105*  BUN 9 11 8  7* 9  CREATININE 0.74 0.77 0.64 0.58 0.68  CALCIUM  8.5* 9.4 8.0* 8.3* 8.4*  MG  --   --  1.9 2.2 2.0  PHOS  --   --  2.4* 2.4* 2.3*   Estimated Creatinine Clearance: 83.2 mL/min (by C-G formula based on SCr of 0.68 mg/dL). Liver & Pancreas: Recent Labs  Lab 12/21/23 1358 12/22/23 1045 12/23/23 0541 12/24/23 0522 12/25/23 0536  AST 14* 21 21  --   --   ALT 14 20 19   --   --   ALKPHOS 74 92 81  --   --   BILITOT 0.6 0.6 0.4  --   --   PROT 5.8* 6.2* 5.2*  --   --   ALBUMIN 3.2* 3.5 2.8* 2.7* 2.8*   Recent Labs  Lab 12/22/23 1045  LIPASE 11   No results for input(s): AMMONIA in the last 168 hours. Diabetic: Recent Labs    12/23/23 0541  HGBA1C 5.9*   No results for input(s): GLUCAP in the last 168 hours. Cardiac Enzymes: No results for input(s): CKTOTAL, CKMB, CKMBINDEX, TROPONINI in the last 168 hours. No results for input(s): PROBNP in the last 8760 hours. Coagulation Profile: No results for input(s): INR, PROTIME in the last 168 hours. Thyroid Function Tests: No results for input(s): TSH, T4TOTAL, FREET4, T3FREE, THYROIDAB in the last 72 hours. Lipid Profile: No results for input(s): CHOL, HDL, LDLCALC, TRIG, CHOLHDL, LDLDIRECT in the last 72 hours. Anemia Panel: No results for input(s): VITAMINB12, FOLATE, FERRITIN, TIBC, IRON, RETICCTPCT in the last 72 hours. Urine  analysis:    Component Value Date/Time   COLORURINE YELLOW 12/22/2023 1135   APPEARANCEUR CLOUDY (A) 12/22/2023 1135   LABSPEC 1.020 12/22/2023 1135   PHURINE 6.5 12/22/2023 1135   GLUCOSEU NEGATIVE 12/22/2023 1135   HGBUR MODERATE (A) 12/22/2023 1135   BILIRUBINUR NEGATIVE 12/22/2023 1135   KETONESUR NEGATIVE 12/22/2023 1135   PROTEINUR 30 (A) 12/22/2023 1135   UROBILINOGEN 0.2 09/06/2009 1350   NITRITE NEGATIVE 12/22/2023 1135   LEUKOCYTESUR LARGE (A) 12/22/2023 1135   Sepsis Labs: Invalid input(s): PROCALCITONIN, LACTICIDVEN   SIGNED:  Arman Loy T Sigrid Schwebach, MD  [facility] 12/25/2023, 1:00 PM

## 2023-12-25 NOTE — Plan of Care (Signed)

## 2023-12-25 NOTE — Progress Notes (Signed)
 Subjective No acute events. Reports she is feeling well. No n/v. Now tolerating soft diet. Continues with flatus and multiple BMs  Objective: Vital signs in last 24 hours: Temp:  [97.9 F (36.6 C)-99.1 F (37.3 C)] 99.1 F (37.3 C) (11/11 0627) Pulse Rate:  [84-91] 88 (11/11 0627) Resp:  [15-17] 17 (11/11 0627) BP: (129-142)/(55-68) 129/55 (11/11 0627) SpO2:  [96 %-98 %] 96 % (11/11 0627) Last BM Date : 12/23/23  Intake/Output from previous day: 11/10 0701 - 11/11 0700 In: 1259.9 [P.O.:960; IV Piggyback:299.9] Out: -  Intake/Output this shift: No intake/output data recorded.  Gen: NAD, comfortable CV: RRR Pulm: Normal work of breathing Abd: Soft, NT/ND  Ext: SCDs in place  Lab Results: CBC  Recent Labs    12/24/23 0522 12/25/23 0536  WBC 2.1* 2.4*  HGB 9.2* 8.9*  HCT 28.7* 27.6*  PLT 149* 143*   BMET Recent Labs    12/24/23 0522 12/25/23 0536  NA 135 137  K 3.4* 3.7  CL 103 104  CO2 25 25  GLUCOSE 98 105*  BUN 7* 9  CREATININE 0.58 0.68  CALCIUM  8.3* 8.4*   PT/INR No results for input(s): LABPROT, INR in the last 72 hours. ABG No results for input(s): PHART, HCO3 in the last 72 hours.  Invalid input(s): PCO2, PO2  Studies/Results:  Anti-infectives: Anti-infectives (From admission, onward)    Start     Dose/Rate Route Frequency Ordered Stop   12/23/23 0600  cefTRIAXone  (ROCEPHIN ) 2 g in sodium chloride  0.9 % 100 mL IVPB        2 g 200 mL/hr over 30 Minutes Intravenous Every 24 hours 12/22/23 1751     12/23/23 0000  cefTRIAXone  (ROCEPHIN ) 2 g in sodium chloride  0.9 % 100 mL IVPB  Status:  Discontinued        2 g 200 mL/hr over 30 Minutes Intravenous  Once 12/22/23 1728 12/22/23 1750   12/23/23 0000  metroNIDAZOLE (FLAGYL) IVPB 500 mg        500 mg 100 mL/hr over 60 Minutes Intravenous Every 12 hours 12/22/23 1728     12/23/23 0000  cefTRIAXone  (ROCEPHIN ) 2 g in sodium chloride  0.9 % 100 mL IVPB  Status:  Discontinued        2  g 200 mL/hr over 30 Minutes Intravenous Every 24 hours 12/22/23 1750 12/22/23 1751   12/22/23 1315  cefTRIAXone  (ROCEPHIN ) 1 g in sodium chloride  0.9 % 100 mL IVPB        1 g 200 mL/hr over 30 Minutes Intravenous  Once 12/22/23 1301 12/22/23 1344   12/22/23 1315  metroNIDAZOLE (FLAGYL) IVPB 500 mg        500 mg 100 mL/hr over 60 Minutes Intravenous  Once 12/22/23 1301 12/22/23 1444        Assessment/Plan: Patient Active Problem List   Diagnosis Date Noted   Enteritis 12/23/2023   Chemotherapy induced neutropenia 12/23/2023   SBO (small bowel obstruction) (HCC) 12/22/2023   Neutropenic fever 11/27/2023   Anal cancer (HCC) 10/29/2023   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   Tobacco abuse 12/26/2021   Community acquired pneumonia 12/26/2021   QT prolongation 12/21/2021   Hypertension 12/21/2021   Acute lung injury 12/20/2021   Multifocal pneumonia 12/18/2021   Sepsis due to pneumonia (HCC) 12/17/2021   Depression 12/17/2021   Hypokalemia 12/17/2021   Acute respiratory failure with hypoxia (HCC) 12/17/2021   Essential hypertension 12/17/2021   Prolonged QT interval 12/17/2021   Possible partial SBO, enteritis   -  Soft diet as tolerated - Ambulate as able - Ok for antidiarrheals per primary - noted c diff was negative - We will remain available   LOS: 3 days   I spent a total of 35 minutes in both face-to-face and non-face-to-face activities, excluding procedures performed, for this visit on the date of this encounter.   Lonni Pizza, MD [facility], [facility]

## 2023-12-26 NOTE — Radiation Completion Notes (Addendum)
  Radiation Oncology         [phone] ________________________________  Name: Erin [name] MRN: [id number]  Date of Service: 12/25/23  DOB: 1950/05/24  End of Treatment Note  Diagnosis:  Squamous Cell Carcinoma of the Anus.   Intent: Curative     ==========DELIVERED PLANS==========  First Treatment Date: 2023-11-12 Last Treatment Date: 2023-12-21   Plan Name: Anus Site: Anus Technique: IMRT Mode: Photon Dose Per Fraction: 1.8 Gy Prescribed Dose (Delivered / Prescribed): 48.6 Gy / 50.4 Gy Prescribed Fxs (Delivered / Prescribed): 27 / 28     ==========ON TREATMENT VISIT DATES========== 2023-11-16, 2023-11-23, 2023-11-30, 2023-12-07, 2023-12-14, 2023-12-20    See weekly On Treatment Notes in Epic for details in the Media tab (listed as Progress notes on the On Treatment Visit Dates listed above). The patient tolerated radiation. She developed fatigue and anticipated skin changes in the treatment field. She had diarrhea that was treated with imodium and lomotil. She was ultimately hospitalized and found to have dilated loops of small bowel and was treated conservatively though her last treatment was not administered as her small bowel was then in the region of potential treatment.   The patient will receive a call in about one month from the radiation oncology department. She will continue follow up with Dr. Cloretta as well.      Donald KYM Husband, PAC

## 2023-12-27 ENCOUNTER — Inpatient Hospital Stay (HOSPITAL_BASED_OUTPATIENT_CLINIC_OR_DEPARTMENT_OTHER): Admitting: Nurse Practitioner

## 2023-12-27 ENCOUNTER — Encounter: Payer: Self-pay | Admitting: Nurse Practitioner

## 2023-12-27 ENCOUNTER — Inpatient Hospital Stay

## 2023-12-27 VITALS — BP 140/73 | HR 100 | Temp 97.6°F | Resp 18 | Ht 69.0 in | Wt 221.6 lb

## 2023-12-27 DIAGNOSIS — Z5111 Encounter for antineoplastic chemotherapy: Secondary | ICD-10-CM | POA: Diagnosis not present

## 2023-12-27 DIAGNOSIS — Z85828 Personal history of other malignant neoplasm of skin: Secondary | ICD-10-CM | POA: Diagnosis not present

## 2023-12-27 DIAGNOSIS — C210 Malignant neoplasm of anus, unspecified: Secondary | ICD-10-CM

## 2023-12-27 DIAGNOSIS — Z510 Encounter for antineoplastic radiation therapy: Secondary | ICD-10-CM | POA: Diagnosis present

## 2023-12-27 DIAGNOSIS — Z452 Encounter for adjustment and management of vascular access device: Secondary | ICD-10-CM | POA: Diagnosis not present

## 2023-12-27 LAB — CBC WITH DIFFERENTIAL (CANCER CENTER ONLY)
Abs Immature Granulocytes: 0.04 K/uL (ref 0.00–0.07)
Basophils Absolute: 0 K/uL (ref 0.0–0.1)
Basophils Relative: 0 %
Eosinophils Absolute: 0.1 K/uL (ref 0.0–0.5)
Eosinophils Relative: 2 %
HCT: 27.8 % — ABNORMAL LOW (ref 36.0–46.0)
Hemoglobin: 9.4 g/dL — ABNORMAL LOW (ref 12.0–15.0)
Immature Granulocytes: 1 %
Lymphocytes Relative: 6 %
Lymphs Abs: 0.2 K/uL — ABNORMAL LOW (ref 0.7–4.0)
MCH: 29.6 pg (ref 26.0–34.0)
MCHC: 33.8 g/dL (ref 30.0–36.0)
MCV: 87.4 fL (ref 80.0–100.0)
Monocytes Absolute: 0.5 K/uL (ref 0.1–1.0)
Monocytes Relative: 14 %
Neutro Abs: 2.6 K/uL (ref 1.7–7.7)
Neutrophils Relative %: 77 %
Platelet Count: 168 K/uL (ref 150–400)
RBC: 3.18 MIL/uL — ABNORMAL LOW (ref 3.87–5.11)
RDW: 19.7 % — ABNORMAL HIGH (ref 11.5–15.5)
WBC Count: 3.4 K/uL — ABNORMAL LOW (ref 4.0–10.5)
nRBC: 0 % (ref 0.0–0.2)

## 2023-12-27 LAB — CULTURE, BLOOD (ROUTINE X 2)
Culture: NO GROWTH
Culture: NO GROWTH
Special Requests: ADEQUATE
Special Requests: ADEQUATE

## 2023-12-27 NOTE — Progress Notes (Signed)
  [facility] OFFICE PROGRESS NOTE   Diagnosis: Anal cancer  INTERVAL HISTORY:   [name] returns as scheduled.  She completed cycle two 5-FU/Mitomycin-C 12/10/2023.  She was hospitalized 12/22/2023 through 12/25/2023 with diarrhea which improved during the hospitalization.  She had moderate neutropenia on admission, improved to normal range by discharge.  Radiation oncology decided to cancel the final fraction of radiation.  She notes improvement in the diarrhea but continues to have loose stools.  She had 4 yesterday, none so far today.  She is taking 2 Lomotil tablets 4 times a day.  She feels fluid intake is adequate, daughter disagrees.  No nausea/vomiting.  No mouth sores.  She feels weak.  No fever.  Objective:  Vital signs in last 24 hours:  Blood pressure (!) 140/73, pulse 100, temperature 97.6 F (36.4 C), temperature source Temporal, resp. rate 18, height 5' 9 (1.753 m), weight 221 lb 9.6 oz (100.5 kg), SpO2 98%.    HEENT: No mouth sores.  No thrush. Resp: Scattered inspiratory wheezes.  No respiratory distress. Cardio: Regular rate and rhythm. GI: No hepatosplenomegaly. Vascular: No leg edema. Neuro: Alert and oriented. Skin: Skin turgor intact.  Gluteal fold/perianal skin erythematous with scattered small areas of superficial ulceration.   Lab Results:  Lab Results  Component Value Date   WBC 3.4 (L) 12/27/2023   HGB 9.4 (L) 12/27/2023   HCT 27.8 (L) 12/27/2023   MCV 87.4 12/27/2023   PLT 168 12/27/2023   NEUTROABS 2.6 12/27/2023    Imaging:  No results found.  Medications: I have reviewed the patient's current medications.  Assessment/Plan: Anal cancer, stage IIa (cT2, cN0) 09/27/2023 colonoscopy: Mass at the dentate line, 3.5 cm, biopsy-moderately differentiated squamous cell carcinoma, p16 positive, Ki-67 increased 08/20/2023-CT chest lung cancer screening-unchanged 4 mm subpleural nodule right middle lobe, no new  nodules 10/08/2023-CT abdomen/pelvis-no evidence of metastatic disease, right hepatic cyst, shotty retroperitoneal and pelvic adenopathy-no pathologic adenopathy PET scan 11/02/2023-hypermetabolic soft tissue prominence at the anorectal junction.  No evidence of metastatic disease. Radiation 11/12/2023-12/21/2023 Cycle 1 5-FU/Mitomycin-C 11/12/2023 Cycle 2 5-FU/Mitomycin-C 12/10/2023 G2, P2 Basal cell carcinoma of the nose Hyperlipidemia Anxiety/depression Alcoholism Tobacco use Admission 11/26/2023 with fever and neutropenia, blood cultures negative, discharged home 11/28/2023 on oral antibiotics 12/22/2023 admission with diarrhea, neutropenia 12/22/2023 CT abdomen/pelvis: Dilated proximal and mid small bowel loops with scattered air/fluid levels, long segment of small bowel wall thickening at the ileum  Disposition: [name] appears stable.  She has completed the planned course of chemotherapy/radiation.  The diarrhea is better.  She will continue Lomotil/Imodium as needed.  We discussed a bland/soft diet for now.  She will continue to try to push fluids.  We offered IV fluids today.  She declines, prefers to come back tomorrow.  We will also check a basic metabolic panel when she returns.  We reviewed the CBC from today.  ANC continues to improve, now higher in the normal range.  She will return for a follow-up visit in 1 week.  We are available to see her sooner if needed.    Olam Ned ANP/GNP-BC   12/27/2023  2:10 PM

## 2023-12-27 NOTE — Addendum Note (Signed)
 Addended by: DEBBY OLAM POUR on: 12/27/2023 03:46 PM   Modules accepted: Orders

## 2023-12-28 ENCOUNTER — Inpatient Hospital Stay

## 2023-12-28 ENCOUNTER — Other Ambulatory Visit: Payer: Self-pay

## 2023-12-28 VITALS — BP 133/70 | HR 82 | Temp 97.9°F | Resp 18

## 2023-12-28 DIAGNOSIS — Z510 Encounter for antineoplastic radiation therapy: Secondary | ICD-10-CM | POA: Diagnosis not present

## 2023-12-28 DIAGNOSIS — C210 Malignant neoplasm of anus, unspecified: Secondary | ICD-10-CM

## 2023-12-28 DIAGNOSIS — E860 Dehydration: Secondary | ICD-10-CM

## 2023-12-28 LAB — BASIC METABOLIC PANEL - CANCER CENTER ONLY
Anion gap: 9 (ref 5–15)
BUN: 5 mg/dL — ABNORMAL LOW (ref 8–23)
CO2: 26 mmol/L (ref 22–32)
Calcium: 9.3 mg/dL (ref 8.9–10.3)
Chloride: 103 mmol/L (ref 98–111)
Creatinine: 0.62 mg/dL (ref 0.44–1.00)
GFR, Estimated: >60 mL/min (ref >60)
Glucose, Bld: 117 mg/dL — ABNORMAL HIGH (ref 70–99)
Potassium: 3.4 mmol/L — ABNORMAL LOW (ref 3.5–5.1)
Sodium: 138 mmol/L (ref 135–145)

## 2023-12-28 MED ORDER — SODIUM CHLORIDE 0.9 % IV SOLN: Freq: Once | INTRAVENOUS | Status: AC

## 2023-12-28 NOTE — Patient Instructions (Signed)
 Dehydration, Adult Dehydration is a condition in which there is not enough water or other fluids in the body. This happens when a person loses more fluids than they take in. Important organs cannot work right without the right amount of fluids. Any loss of fluids from the body can cause dehydration. Dehydration can be mild, worse, or very bad. It should be treated right away to keep it from getting very bad. What are the causes? Conditions that cause loss of water in the body. They include: Watery poop (diarrhea). Vomiting. Sweating a lot. Fever. Infection. Peeing (urinating) a lot. Not drinking enough fluids. Certain medicines, such as medicines that take extra fluid out of the body (diuretics). Lack of safe drinking water. Not being able to get enough water and food. What increases the risk? Having a long-term (chronic) illness that has not been treated the right way, such as: Diabetes. Heart disease. Kidney disease. Being 25 years of age or older. Having a disability. Living in a place that is high above the ground or sea (high in altitude). The thinner, drier air causes more fluid loss. Doing exercises that put stress on your body for a long time. Being active when in hot places. What are the signs or symptoms? Symptoms of dehydration depend on how bad it is. Mild or worse dehydration Thirst. Dry lips or dry mouth. Feeling dizzy or light-headed. Muscle cramps. Passing little pee or dark pee. Pee may be the color of tea. Headache. Very bad dehydration Changes in skin. Skin may: Be cold to the touch (clammy). Be blotchy or pale. Not go back to normal right after you pinch it and let it go. Little or no tears, pee, or sweat. Fast breathing. Low blood pressure. Weak pulse. Pulse that is more than 100 beats a minute when you are sitting still. Other changes, such as: Feeling very thirsty. Eyes that look hollow (sunken). Cold hands and feet. Being confused. Being very  tired (lethargic) or having trouble waking from sleep. Losing weight. Loss of consciousness. How is this treated? Treatment for this condition depends on how bad your dehydration is. Treatment should start right away. Do not wait until your condition gets very bad. Very bad dehydration is an emergency. You will need to go to a hospital. Mild or worse dehydration can be treated at home. You may be asked to: Drink more fluids. Drink an oral rehydration solution (ORS). This drink gives you the right amount of fluids, salts, and minerals (electrolytes). Very bad dehydration can be treated: With fluids through an IV tube. By correcting low levels of electrolytes in the body. By treating the problem that caused your dehydration. Follow these instructions at home: Oral rehydration solution If told by your doctor, drink an ORS: Make an ORS. Use instructions on the package. Start by drinking small amounts, about  cup (120 mL) every 5-10 minutes. Slowly drink more until you have had the amount that your doctor said to have.  Eating and drinking  Drink enough clear fluid to keep your pee pale yellow. If you were told to drink an ORS, finish the ORS first. Then, start slowly drinking other clear fluids. Drink fluids such as: Water. Do not drink only water. Doing that can make the salt (sodium) level in your body get too low. Water from ice chips you suck on. Fruit juice that you have added water to (diluted). Low-calorie sports drinks. Eat foods that have the right amounts of salts and minerals, such as bananas, oranges, potatoes,  tomatoes, or spinach. Do not drink alcohol. Avoid drinks that have caffeine or sugar. These include:: High-calorie sports drinks. Fruit juice that you did not add water to. Soda. Coffee or energy drinks. Avoid foods that are greasy or have a lot of fat or sugar. General instructions Take over-the-counter and prescription medicines only as told by your doctor. Do  not take sodium tablets. Doing that can make the salt level in your body get too high. Return to your normal activities as told by your doctor. Ask your doctor what activities are safe for you. Keep all follow-up visits. Your doctor may check and change your treatment. Contact a doctor if: You have pain in your belly (abdomen) and the pain: Gets worse. Stays in one place. You have a rash. You have a stiff neck. You get angry or annoyed more easily than normal. You are more tired or have a harder time waking than normal. You feel weak or dizzy. You feel very thirsty. Get help right away if: You have any symptoms of very bad dehydration. You vomit every time you eat or drink. Your vomiting gets worse, does not go away, or you vomit blood or green stuff. You are getting treatment, but symptoms are getting worse. You have a fever. You have a very bad headache. You have: Diarrhea that gets worse or does not go away. Blood in your poop (stool). This may cause poop to look black and tarry. No pee in 6-8 hours. Only a small amount of pee in 6-8 hours, and the pee is very dark. You have trouble breathing. These symptoms may be an emergency. Get help right away. Call 911. Do not wait to see if the symptoms will go away. Do not drive yourself to the hospital. This information is not intended to replace advice given to you by your health care provider. Make sure you discuss any questions you have with your health care provider. Document Revised: 08/29/2021 Document Reviewed: 08/29/2021 Elsevier Patient Education  2024 [employer].

## 2023-12-31 NOTE — Progress Notes (Signed)
 Neg blood cultures

## 2024-01-02 ENCOUNTER — Inpatient Hospital Stay (HOSPITAL_BASED_OUTPATIENT_CLINIC_OR_DEPARTMENT_OTHER): Admitting: Nurse Practitioner

## 2024-01-02 ENCOUNTER — Encounter: Payer: Self-pay | Admitting: Nurse Practitioner

## 2024-01-02 ENCOUNTER — Telehealth: Payer: Self-pay

## 2024-01-02 ENCOUNTER — Ambulatory Visit (HOSPITAL_BASED_OUTPATIENT_CLINIC_OR_DEPARTMENT_OTHER)
Admission: RE | Admit: 2024-01-02 | Discharge: 2024-01-02 | Disposition: A | Discharge status: Home / Self Care | Source: Ambulatory Visit | Attending: Nurse Practitioner | Admitting: Nurse Practitioner

## 2024-01-02 ENCOUNTER — Inpatient Hospital Stay

## 2024-01-02 ENCOUNTER — Other Ambulatory Visit: Payer: Self-pay

## 2024-01-02 VITALS — BP 104/61 | HR 93 | Temp 97.6°F | Resp 18 | Ht 69.0 in | Wt 217.6 lb

## 2024-01-02 DIAGNOSIS — C210 Malignant neoplasm of anus, unspecified: Secondary | ICD-10-CM

## 2024-01-02 DIAGNOSIS — Z510 Encounter for antineoplastic radiation therapy: Secondary | ICD-10-CM | POA: Diagnosis not present

## 2024-01-02 LAB — CBC WITH DIFFERENTIAL (CANCER CENTER ONLY)
Abs Immature Granulocytes: 0.02 K/uL (ref 0.00–0.07)
Basophils Absolute: 0 K/uL (ref 0.0–0.1)
Basophils Relative: 1 %
Eosinophils Absolute: 0 K/uL (ref 0.0–0.5)
Eosinophils Relative: 1 %
HCT: 30.4 % — ABNORMAL LOW (ref 36.0–46.0)
Hemoglobin: 10.1 g/dL — ABNORMAL LOW (ref 12.0–15.0)
Immature Granulocytes: 1 %
Lymphocytes Relative: 14 %
Lymphs Abs: 0.5 K/uL — ABNORMAL LOW (ref 0.7–4.0)
MCH: 29.7 pg (ref 26.0–34.0)
MCHC: 33.2 g/dL (ref 30.0–36.0)
MCV: 89.4 fL (ref 80.0–100.0)
Monocytes Absolute: 0.4 K/uL (ref 0.1–1.0)
Monocytes Relative: 12 %
Neutro Abs: 2.6 K/uL (ref 1.7–7.7)
Neutrophils Relative %: 71 %
Platelet Count: 224 K/uL (ref 150–400)
RBC: 3.4 MIL/uL — ABNORMAL LOW (ref 3.87–5.11)
RDW: 21 % — ABNORMAL HIGH (ref 11.5–15.5)
WBC Count: 3.6 K/uL — ABNORMAL LOW (ref 4.0–10.5)
nRBC: 0 % (ref 0.0–0.2)

## 2024-01-02 LAB — BASIC METABOLIC PANEL - CANCER CENTER ONLY
Anion gap: 9 (ref 5–15)
BUN: 7 mg/dL — ABNORMAL LOW (ref 8–23)
CO2: 25 mmol/L (ref 22–32)
Calcium: 9.4 mg/dL (ref 8.9–10.3)
Chloride: 104 mmol/L (ref 98–111)
Creatinine: 0.62 mg/dL (ref 0.44–1.00)
GFR, Estimated: >60 mL/min (ref >60)
Glucose, Bld: 111 mg/dL — ABNORMAL HIGH (ref 70–99)
Potassium: 4.5 mmol/L (ref 3.5–5.1)
Sodium: 138 mmol/L (ref 135–145)

## 2024-01-02 NOTE — Telephone Encounter (Signed)
-----   Message from Olam Ned sent at 01/02/2024  4:03 PM EST ----- Please let her know the Doppler study was negative for DVT, showed a Baker's cyst.  Does she want to see orthopedics?

## 2024-01-02 NOTE — Progress Notes (Addendum)
  [facility] OFFICE PROGRESS NOTE   Diagnosis: Anal cancer  INTERVAL HISTORY:   [name] returns as scheduled.  She received IV fluids last week.  She is feeling better.  She had 2 loose stools yesterday, none so far today.  Appetite is improving.  She reports good fluid intake.  No nausea or vomiting.  She feels the perianal skin is healing.  Objective:  Vital signs in last 24 hours:  Blood pressure 104/61, pulse 93, temperature 97.6 F (36.4 C), temperature source Temporal, resp. rate 18, height 5' 9 (1.753 m), weight 217 lb 9.6 oz (98.7 kg), SpO2 100%.    HEENT: No thrush or ulcers. Resp: Lungs clear bilaterally. Cardio: Regular rate and rhythm. GI: No hepatosplenomegaly.  Nontender. Vascular: Trace lower leg edema bilaterally left greater than right.  Calves nontender. Skin: Gluteal fold/perianal skin appears to be healing.  No significant ulcerations.   Lab Results:  Lab Results  Component Value Date   WBC 3.6 (L) 01/02/2024   HGB 10.1 (L) 01/02/2024   HCT 30.4 (L) 01/02/2024   MCV 89.4 01/02/2024   PLT 224 01/02/2024   NEUTROABS 2.6 01/02/2024    Imaging:  No results found.  Medications: I have reviewed the patient's current medications.  Assessment/Plan: Anal cancer, stage IIa (cT2, cN0) 09/27/2023 colonoscopy: Mass at the dentate line, 3.5 cm, biopsy-moderately differentiated squamous cell carcinoma, p16 positive, Ki-67 increased 08/20/2023-CT chest lung cancer screening-unchanged 4 mm subpleural nodule right middle lobe, no new nodules 10/08/2023-CT abdomen/pelvis-no evidence of metastatic disease, right hepatic cyst, shotty retroperitoneal and pelvic adenopathy-no pathologic adenopathy PET scan 11/02/2023-hypermetabolic soft tissue prominence at the anorectal junction.  No evidence of metastatic disease. Radiation 11/12/2023-12/21/2023 Cycle 1 5-FU/Mitomycin -C 11/12/2023 Cycle 2 5-FU/Mitomycin -C 12/10/2023 G2, P2 Basal cell carcinoma of the  nose Hyperlipidemia Anxiety/depression Alcoholism Tobacco use Admission 11/26/2023 with fever and neutropenia, blood cultures negative, discharged home 11/28/2023 on oral antibiotics 12/22/2023 admission with diarrhea, neutropenia 12/22/2023 CT abdomen/pelvis: Dilated proximal and mid small bowel loops with scattered air/fluid levels, long segment of small bowel wall thickening at the ileum  Disposition: [name] continues to recover from the recent concurrent chemo/radiation therapy.  Performance status is improving.  Diarrhea also improving.  We reviewed the CBC and basic metabolic panel from today.  Potassium has normalized.  She has asymmetric leg edema.  Plan for venous Doppler study today.  We discussed the recommendation will be for anticoagulation if a DVT is confirmed.  We reviewed the risk of bleeding.  She agrees with our plan.  She will return for lab and follow-up in 4 weeks.  We are available to see her sooner if needed.    Olam Ned ANP/GNP-BC   01/02/2024  1:52 PM   Addendum 4:01 pm-doppler negative for left lower extremity DVT. Findings most consistent with a 4 cm Baker's cyst.

## 2024-01-02 NOTE — Addendum Note (Signed)
 Addended by: DEBBY OLAM POUR on: 01/02/2024 02:33 PM   Modules accepted: Level of Service

## 2024-01-02 NOTE — Addendum Note (Signed)
 Addended by: DEBBY OLAM POUR on: 01/02/2024 02:29 PM   Modules accepted: Orders

## 2024-01-02 NOTE — Telephone Encounter (Signed)
 Patient gave verbal understanding and had no further question and a referral was made to orthopedics

## 2024-01-04 ENCOUNTER — Other Ambulatory Visit: Payer: Self-pay

## 2024-01-07 ENCOUNTER — Other Ambulatory Visit: Payer: Self-pay | Admitting: Radiation Oncology

## 2024-01-07 ENCOUNTER — Other Ambulatory Visit: Payer: Self-pay | Admitting: Oncology

## 2024-01-07 ENCOUNTER — Encounter: Payer: Self-pay | Admitting: Oncology

## 2024-01-07 NOTE — Telephone Encounter (Signed)
 Open error

## 2024-01-14 NOTE — Progress Notes (Signed)
  Radiation Oncology         [phone] ________________________________  Name: Erin [name] MRN: [id number]  Date of Service: 01/21/2024  DOB: 12/30/1950  Post Treatment Telephone Note  Diagnosis:  Squamous Cell Carcinoma of the Anus.    First Treatment Date: 2023-11-12 Last Treatment Date: 2023-12-21   Plan Name: Anus Site: Anus Technique: IMRT Mode: Photon Dose Per Fraction: 1.8 Gy Prescribed Dose (Delivered / Prescribed): 48.6 Gy / 50.4 Gy Prescribed Fxs (Delivered / Prescribed): 27 / 28  The patient was not available for call today.

## 2024-01-16 ENCOUNTER — Ambulatory Visit (HOSPITAL_BASED_OUTPATIENT_CLINIC_OR_DEPARTMENT_OTHER): Admitting: Orthopaedic Surgery

## 2024-01-21 ENCOUNTER — Ambulatory Visit
Admission: RE | Admit: 2024-01-21 | Discharge: 2024-01-21 | Disposition: A | Source: Ambulatory Visit | Attending: Radiation Oncology

## 2024-01-21 DIAGNOSIS — C210 Malignant neoplasm of anus, unspecified: Secondary | ICD-10-CM

## 2024-01-28 ENCOUNTER — Telehealth: Payer: Self-pay | Admitting: Oncology

## 2024-01-28 NOTE — Telephone Encounter (Signed)
PT called to reschedule appt

## 2024-01-29 ENCOUNTER — Other Ambulatory Visit: Payer: Self-pay

## 2024-01-30 ENCOUNTER — Inpatient Hospital Stay: Admitting: Oncology

## 2024-01-30 ENCOUNTER — Inpatient Hospital Stay

## 2024-02-06 ENCOUNTER — Inpatient Hospital Stay: Attending: Radiation Oncology

## 2024-02-06 ENCOUNTER — Inpatient Hospital Stay: Admitting: Oncology

## 2024-02-06 VITALS — BP 117/58 | HR 83 | Temp 98.0°F | Resp 20 | Ht 69.0 in | Wt 213.4 lb

## 2024-02-06 DIAGNOSIS — R911 Solitary pulmonary nodule: Secondary | ICD-10-CM | POA: Diagnosis not present

## 2024-02-06 DIAGNOSIS — C44311 Basal cell carcinoma of skin of nose: Secondary | ICD-10-CM | POA: Diagnosis not present

## 2024-02-06 DIAGNOSIS — M7122 Synovial cyst of popliteal space [Baker], left knee: Secondary | ICD-10-CM | POA: Insufficient documentation

## 2024-02-06 DIAGNOSIS — C210 Malignant neoplasm of anus, unspecified: Secondary | ICD-10-CM | POA: Insufficient documentation

## 2024-02-06 DIAGNOSIS — D709 Neutropenia, unspecified: Secondary | ICD-10-CM | POA: Diagnosis not present

## 2024-02-06 DIAGNOSIS — F418 Other specified anxiety disorders: Secondary | ICD-10-CM | POA: Insufficient documentation

## 2024-02-06 DIAGNOSIS — E785 Hyperlipidemia, unspecified: Secondary | ICD-10-CM | POA: Diagnosis not present

## 2024-02-06 LAB — CBC WITH DIFFERENTIAL (CANCER CENTER ONLY)
Abs Immature Granulocytes: 0.01 K/uL (ref 0.00–0.07)
Basophils Absolute: 0 K/uL (ref 0.0–0.1)
Basophils Relative: 1 %
Eosinophils Absolute: 0.2 K/uL (ref 0.0–0.5)
Eosinophils Relative: 3 %
HCT: 33.8 % — ABNORMAL LOW (ref 36.0–46.0)
Hemoglobin: 11.1 g/dL — ABNORMAL LOW (ref 12.0–15.0)
Immature Granulocytes: 0 %
Lymphocytes Relative: 18 %
Lymphs Abs: 0.8 K/uL (ref 0.7–4.0)
MCH: 31.1 pg (ref 26.0–34.0)
MCHC: 32.8 g/dL (ref 30.0–36.0)
MCV: 94.7 fL (ref 80.0–100.0)
Monocytes Absolute: 0.5 K/uL (ref 0.1–1.0)
Monocytes Relative: 12 %
Neutro Abs: 2.9 K/uL (ref 1.7–7.7)
Neutrophils Relative %: 66 %
Platelet Count: 218 K/uL (ref 150–400)
RBC: 3.57 MIL/uL — ABNORMAL LOW (ref 3.87–5.11)
RDW: 18.6 % — ABNORMAL HIGH (ref 11.5–15.5)
WBC Count: 4.4 K/uL (ref 4.0–10.5)
nRBC: 0 % (ref 0.0–0.2)

## 2024-02-06 NOTE — Progress Notes (Signed)
" °  [facility] OFFICE PROGRESS NOTE   Diagnosis: Anal cancer  INTERVAL HISTORY:   [name] returns as scheduled.  She feels well.  Diarrhea has resolved.  No complaint.  Skin breakdown at the perineum has resolved.  She is scheduled to see Dr. Debby 02/11/2024.  Objective:  Vital signs in last 24 hours:  Blood pressure (!) 117/58, pulse 83, temperature 98 F (36.7 C), temperature source Oral, resp. rate 20, height 5' 9 (1.753 m), weight 213 lb 6.4 oz (96.8 kg), SpO2 99%.   Lymphatics: No cervical, supraclavicular, axillary, or inguinal nodes Resp: Lungs clear bilaterally Cardio: Regular rate and rhythm GI: No mass, nontender, no hepatosplenomegaly Vascular: No leg edema Rectal: External exam reveals mild radiation hyperpigmentation at the perineum, soft hemorrhoid at the anterior anal verge, no skin breakdown   Lab Results:  Lab Results  Component Value Date   WBC 4.4 02/06/2024   HGB 11.1 (L) 02/06/2024   HCT 33.8 (L) 02/06/2024   MCV 94.7 02/06/2024   PLT 218 02/06/2024   NEUTROABS 2.9 02/06/2024    CMP  Lab Results  Component Value Date   NA 138 01/02/2024   K 4.5 01/02/2024   CL 104 01/02/2024   CO2 25 01/02/2024   GLUCOSE 111 (H) 01/02/2024   BUN 7 (L) 01/02/2024   CREATININE 0.62 01/02/2024   CALCIUM  9.4 01/02/2024   PROT 5.2 (L) 12/23/2023   ALBUMIN 2.8 (L) 12/25/2023   AST 21 12/23/2023   ALT 19 12/23/2023   ALKPHOS 81 12/23/2023   BILITOT 0.4 12/23/2023   GFRNONAA >60 01/02/2024   GFRAA >60 08/17/2016    No results found for: CEA1, CEA, CAN199, CA125  Lab Results  Component Value Date   INR 0.9 11/26/2023   LABPROT 12.6 11/26/2023    Imaging:  No results found.  Medications: I have reviewed the patient's current medications.   Assessment/Plan: Anal cancer, stage IIa (cT2, cN0) 09/27/2023 colonoscopy: Mass at the dentate line, 3.5 cm, biopsy-moderately differentiated squamous cell carcinoma, p16 positive,  Ki-67 increased 08/20/2023-CT chest lung cancer screening-unchanged 4 mm subpleural nodule right middle lobe, no new nodules 10/08/2023-CT abdomen/pelvis-no evidence of metastatic disease, right hepatic cyst, shotty retroperitoneal and pelvic adenopathy-no pathologic adenopathy PET scan 11/02/2023-hypermetabolic soft tissue prominence at the anorectal junction.  No evidence of metastatic disease. Radiation 11/12/2023-12/21/2023 Cycle 1 5-FU/Mitomycin -C 11/12/2023 Cycle 2 5-FU/Mitomycin -C 12/10/2023 G2, P2 Basal cell carcinoma of the nose Hyperlipidemia Anxiety/depression Alcoholism Tobacco use Admission 11/26/2023 with fever and neutropenia, blood cultures negative, discharged home 11/28/2023 on oral antibiotics 12/22/2023 admission with diarrhea, neutropenia 12/22/2023 CT abdomen/pelvis: Dilated proximal and mid small bowel loops with scattered air/fluid levels, long segment of small bowel wall thickening at the ileum 10.  Left leg Baker's cyst on Doppler 01/02/2024, negative for DVT   Disposition: Erin [name] has a history of anal cancer.  She completed treatment with concurrent chemotherapy/radiation in early November.  She has recovered from the side effects of treatment.  She will see Dr. Debby 02/11/2024 for anal surveillance.  She will return for an office visit here in 8 months.  Arley Hof, MD  02/06/2024  10:03 AM   "

## 2024-02-07 ENCOUNTER — Other Ambulatory Visit: Payer: Self-pay

## 2024-02-11 NOTE — Progress Notes (Signed)
" ° ° ° ° °  PROVIDER:  BERNARDA WANDA NED, MD  MRN: [id number] DOB: 05-16-1950 DATE OF ENCOUNTER: 02/11/2024  Subjective   Chief Complaint: RE-CHECK     History of Present Illness:  .  Patient underwent screening colonoscopy on September 27, 2023.  A 3.5 cm mass was found in the anal canal.  Pathology showed moderately differentiated invasive squamous cell carcinoma.  CT scans show no evidence of metastatic disease.  Her chemoradiation was completed on December 24, 2023.  She was admitted December 22, 2023 with complaints of neutropenia and radiation enteritis.  It was decided to hold the last treatment of her radiation but otherwise she has finished therapy completely.  Onc: [facility] Rad Onc: [facility] Objective:    Vitals:   02/11/24 1411  PainSc: 0-No pain     Exam Gen: NAD Abd: soft Ing:  Rectal: posterior midline proximal anal canal mass (~6-8cm)   Labs, Imaging and Diagnostic Testing:  Procedure: Anoscopy Surgeon: Ned After the risks and benefits were explained, written consent was obtained for above procedure.  A medical assistant chaperone was present thoroughout the entire procedure. Examination chaperoned by Burnard Crete, LPN. Anesthesia: none Diagnosis: anal SCC Findings: Scar present at posterior midline approximately 6 to 8 cm from the anal verge.  She does have a polypoid mass in the proximal anal canal just distal to her scar that appears benign.  Assessment and Plan:  Anal squamous cell carcinoma (CMS/HHS-HCC)  (primary encounter diagnosis)   73 year old female with asymptomatic anal squamous cell carcinoma found on screening colonoscopy.  CT scans showed no signs of metastatic disease.  She completed treatment in early November.   Return in about 3 months (around 05/11/2024).    Bernarda JAYSON Ned, MD Colon and Rectal Surgery [facility] Surgery    "

## 2024-02-22 ENCOUNTER — Encounter: Payer: Self-pay | Admitting: *Deleted

## 2024-02-22 NOTE — Progress Notes (Signed)
 Faxed signed return to work form to [employer] [phone]

## 2024-10-06 ENCOUNTER — Inpatient Hospital Stay: Admitting: Oncology
